# Patient Record
Sex: Female | Born: 1935 | Race: White | Hispanic: No | Smoking: Never smoker
Health system: Southern US, Community
[De-identification: ages and names within clinical notes are randomized; demographics above are authoritative.]

## PROBLEM LIST (undated history)

## (undated) DIAGNOSIS — K222 Esophageal obstruction: Secondary | ICD-10-CM

## (undated) DIAGNOSIS — F32A Depression, unspecified: Secondary | ICD-10-CM

## (undated) DIAGNOSIS — G249 Dystonia, unspecified: Secondary | ICD-10-CM

## (undated) DIAGNOSIS — K219 Gastro-esophageal reflux disease without esophagitis: Secondary | ICD-10-CM

## (undated) DIAGNOSIS — F419 Anxiety disorder, unspecified: Secondary | ICD-10-CM

## (undated) DIAGNOSIS — M810 Age-related osteoporosis without current pathological fracture: Secondary | ICD-10-CM

## (undated) DIAGNOSIS — E7800 Pure hypercholesterolemia, unspecified: Secondary | ICD-10-CM

## (undated) DIAGNOSIS — F329 Major depressive disorder, single episode, unspecified: Secondary | ICD-10-CM

## (undated) DIAGNOSIS — I10 Essential (primary) hypertension: Secondary | ICD-10-CM

## (undated) DIAGNOSIS — F0390 Unspecified dementia without behavioral disturbance: Secondary | ICD-10-CM

## (undated) DIAGNOSIS — I499 Cardiac arrhythmia, unspecified: Secondary | ICD-10-CM

## (undated) DIAGNOSIS — M1990 Unspecified osteoarthritis, unspecified site: Secondary | ICD-10-CM

## (undated) HISTORY — PX: TONSILLECTOMY: SUR1361

## (undated) HISTORY — DX: Pure hypercholesterolemia, unspecified: E78.00

## (undated) HISTORY — DX: Esophageal obstruction: K22.2

## (undated) HISTORY — DX: Unspecified dementia, unspecified severity, without behavioral disturbance, psychotic disturbance, mood disturbance, and anxiety: F03.90

## (undated) HISTORY — DX: Anxiety disorder, unspecified: F41.9

## (undated) HISTORY — DX: Age-related osteoporosis without current pathological fracture: M81.0

## (undated) HISTORY — DX: Dystonia, unspecified: G24.9

## (undated) HISTORY — PX: CATARACT EXTRACTION, BILATERAL: SHX1313

## (undated) HISTORY — PX: ESOPHAGOGASTRODUODENOSCOPY: SHX1529

---

## 1997-11-24 ENCOUNTER — Ambulatory Visit (HOSPITAL_COMMUNITY): Admission: RE | Admit: 1997-11-24 | Discharge: 1997-11-24 | Payer: Self-pay | Admitting: Internal Medicine

## 1997-12-31 ENCOUNTER — Ambulatory Visit (HOSPITAL_COMMUNITY): Admission: RE | Admit: 1997-12-31 | Discharge: 1997-12-31 | Payer: Self-pay | Admitting: Internal Medicine

## 1998-01-20 ENCOUNTER — Other Ambulatory Visit: Admission: RE | Admit: 1998-01-20 | Discharge: 1998-01-20 | Payer: Self-pay | Admitting: Obstetrics and Gynecology

## 1998-04-21 ENCOUNTER — Ambulatory Visit (HOSPITAL_COMMUNITY): Admission: RE | Admit: 1998-04-21 | Discharge: 1998-04-21 | Payer: Self-pay | Admitting: Internal Medicine

## 1998-04-21 ENCOUNTER — Encounter: Payer: Self-pay | Admitting: Internal Medicine

## 1999-01-05 ENCOUNTER — Encounter: Payer: Self-pay | Admitting: Emergency Medicine

## 1999-01-05 ENCOUNTER — Encounter: Payer: Self-pay | Admitting: *Deleted

## 1999-01-05 ENCOUNTER — Emergency Department (HOSPITAL_COMMUNITY): Admission: EM | Admit: 1999-01-05 | Discharge: 1999-01-05 | Payer: Self-pay | Admitting: Emergency Medicine

## 1999-01-10 ENCOUNTER — Encounter: Payer: Self-pay | Admitting: Specialist

## 1999-01-10 ENCOUNTER — Inpatient Hospital Stay (HOSPITAL_COMMUNITY): Admission: RE | Admit: 1999-01-10 | Discharge: 1999-01-12 | Payer: Self-pay | Admitting: Specialist

## 1999-01-10 ENCOUNTER — Encounter (INDEPENDENT_AMBULATORY_CARE_PROVIDER_SITE_OTHER): Payer: Self-pay | Admitting: Specialist

## 1999-01-12 ENCOUNTER — Inpatient Hospital Stay (HOSPITAL_COMMUNITY)
Admission: RE | Admit: 1999-01-12 | Discharge: 1999-01-20 | Payer: Self-pay | Admitting: Physical Medicine & Rehabilitation

## 1999-04-07 ENCOUNTER — Inpatient Hospital Stay (HOSPITAL_COMMUNITY): Admission: RE | Admit: 1999-04-07 | Discharge: 1999-04-10 | Payer: Self-pay | Admitting: Specialist

## 1999-05-25 ENCOUNTER — Other Ambulatory Visit: Admission: RE | Admit: 1999-05-25 | Discharge: 1999-05-25 | Payer: Self-pay | Admitting: Obstetrics and Gynecology

## 1999-09-08 ENCOUNTER — Encounter: Payer: Self-pay | Admitting: Internal Medicine

## 1999-09-08 ENCOUNTER — Ambulatory Visit (HOSPITAL_COMMUNITY): Admission: RE | Admit: 1999-09-08 | Discharge: 1999-09-08 | Payer: Self-pay | Admitting: Internal Medicine

## 1999-10-20 ENCOUNTER — Encounter: Payer: Self-pay | Admitting: Internal Medicine

## 1999-10-20 ENCOUNTER — Ambulatory Visit (HOSPITAL_COMMUNITY): Admission: RE | Admit: 1999-10-20 | Discharge: 1999-10-20 | Payer: Self-pay | Admitting: Internal Medicine

## 2000-05-28 ENCOUNTER — Other Ambulatory Visit: Admission: RE | Admit: 2000-05-28 | Discharge: 2000-05-28 | Payer: Self-pay | Admitting: Obstetrics and Gynecology

## 2001-06-03 ENCOUNTER — Other Ambulatory Visit: Admission: RE | Admit: 2001-06-03 | Discharge: 2001-06-03 | Payer: Self-pay | Admitting: Obstetrics and Gynecology

## 2001-11-26 ENCOUNTER — Encounter: Payer: Self-pay | Admitting: Internal Medicine

## 2001-11-26 ENCOUNTER — Ambulatory Visit (HOSPITAL_COMMUNITY): Admission: RE | Admit: 2001-11-26 | Discharge: 2001-11-26 | Payer: Self-pay | Admitting: Internal Medicine

## 2001-12-26 ENCOUNTER — Encounter: Payer: Self-pay | Admitting: Internal Medicine

## 2001-12-26 ENCOUNTER — Ambulatory Visit (HOSPITAL_COMMUNITY): Admission: RE | Admit: 2001-12-26 | Discharge: 2001-12-26 | Payer: Self-pay | Admitting: Internal Medicine

## 2002-06-03 ENCOUNTER — Other Ambulatory Visit: Admission: RE | Admit: 2002-06-03 | Discharge: 2002-06-03 | Payer: Self-pay | Admitting: Obstetrics and Gynecology

## 2002-09-03 ENCOUNTER — Encounter (INDEPENDENT_AMBULATORY_CARE_PROVIDER_SITE_OTHER): Payer: Self-pay | Admitting: *Deleted

## 2002-09-03 ENCOUNTER — Ambulatory Visit (HOSPITAL_COMMUNITY): Admission: RE | Admit: 2002-09-03 | Discharge: 2002-09-03 | Payer: Self-pay | Admitting: Internal Medicine

## 2002-11-04 ENCOUNTER — Ambulatory Visit (HOSPITAL_COMMUNITY): Admission: RE | Admit: 2002-11-04 | Discharge: 2002-11-04 | Payer: Self-pay | Admitting: Internal Medicine

## 2002-11-04 ENCOUNTER — Encounter: Payer: Self-pay | Admitting: Internal Medicine

## 2003-03-23 ENCOUNTER — Ambulatory Visit (HOSPITAL_COMMUNITY): Admission: RE | Admit: 2003-03-23 | Discharge: 2003-03-23 | Payer: Self-pay | Admitting: Internal Medicine

## 2003-03-23 ENCOUNTER — Encounter (INDEPENDENT_AMBULATORY_CARE_PROVIDER_SITE_OTHER): Payer: Self-pay | Admitting: Specialist

## 2003-04-20 ENCOUNTER — Ambulatory Visit (HOSPITAL_COMMUNITY): Admission: RE | Admit: 2003-04-20 | Discharge: 2003-04-20 | Payer: Self-pay | Admitting: Internal Medicine

## 2003-11-10 ENCOUNTER — Ambulatory Visit (HOSPITAL_COMMUNITY): Admission: RE | Admit: 2003-11-10 | Discharge: 2003-11-10 | Payer: Self-pay | Admitting: Internal Medicine

## 2004-05-11 ENCOUNTER — Ambulatory Visit (HOSPITAL_COMMUNITY): Admission: RE | Admit: 2004-05-11 | Discharge: 2004-05-11 | Payer: Self-pay | Admitting: Internal Medicine

## 2004-05-11 ENCOUNTER — Ambulatory Visit: Payer: Self-pay | Admitting: Internal Medicine

## 2004-06-08 ENCOUNTER — Other Ambulatory Visit: Admission: RE | Admit: 2004-06-08 | Discharge: 2004-06-08 | Payer: Self-pay | Admitting: Obstetrics and Gynecology

## 2004-11-24 ENCOUNTER — Ambulatory Visit: Payer: Self-pay | Admitting: Internal Medicine

## 2004-11-24 ENCOUNTER — Ambulatory Visit (HOSPITAL_COMMUNITY): Admission: RE | Admit: 2004-11-24 | Discharge: 2004-11-24 | Payer: Self-pay | Admitting: Internal Medicine

## 2005-01-17 ENCOUNTER — Ambulatory Visit: Payer: Self-pay | Admitting: Internal Medicine

## 2005-01-17 ENCOUNTER — Ambulatory Visit (HOSPITAL_COMMUNITY): Admission: RE | Admit: 2005-01-17 | Discharge: 2005-01-17 | Payer: Self-pay | Admitting: Internal Medicine

## 2005-02-20 ENCOUNTER — Ambulatory Visit (HOSPITAL_COMMUNITY): Admission: RE | Admit: 2005-02-20 | Discharge: 2005-02-20 | Payer: Self-pay | Admitting: Internal Medicine

## 2005-02-20 ENCOUNTER — Ambulatory Visit: Payer: Self-pay | Admitting: Internal Medicine

## 2005-03-06 ENCOUNTER — Ambulatory Visit (HOSPITAL_COMMUNITY): Admission: RE | Admit: 2005-03-06 | Discharge: 2005-03-06 | Payer: Self-pay | Admitting: Internal Medicine

## 2005-04-04 ENCOUNTER — Ambulatory Visit: Payer: Self-pay | Admitting: Internal Medicine

## 2005-04-04 ENCOUNTER — Ambulatory Visit (HOSPITAL_COMMUNITY): Admission: RE | Admit: 2005-04-04 | Discharge: 2005-04-04 | Payer: Self-pay | Admitting: Internal Medicine

## 2005-09-12 ENCOUNTER — Ambulatory Visit (HOSPITAL_COMMUNITY): Admission: RE | Admit: 2005-09-12 | Discharge: 2005-09-12 | Payer: Self-pay | Admitting: Internal Medicine

## 2005-09-18 ENCOUNTER — Ambulatory Visit: Payer: Self-pay | Admitting: Internal Medicine

## 2005-10-19 ENCOUNTER — Ambulatory Visit: Payer: Self-pay | Admitting: Gastroenterology

## 2005-10-19 ENCOUNTER — Ambulatory Visit (HOSPITAL_COMMUNITY): Admission: RE | Admit: 2005-10-19 | Discharge: 2005-10-19 | Payer: Self-pay | Admitting: Gastroenterology

## 2005-10-25 ENCOUNTER — Ambulatory Visit (HOSPITAL_COMMUNITY): Admission: RE | Admit: 2005-10-25 | Discharge: 2005-10-25 | Payer: Self-pay | Admitting: Internal Medicine

## 2005-10-30 ENCOUNTER — Ambulatory Visit: Payer: Self-pay | Admitting: Internal Medicine

## 2005-11-12 ENCOUNTER — Ambulatory Visit (HOSPITAL_COMMUNITY): Admission: RE | Admit: 2005-11-12 | Discharge: 2005-11-12 | Payer: Self-pay | Admitting: Internal Medicine

## 2006-04-29 ENCOUNTER — Ambulatory Visit (HOSPITAL_COMMUNITY): Admission: RE | Admit: 2006-04-29 | Discharge: 2006-04-29 | Payer: Self-pay | Admitting: Internal Medicine

## 2006-05-08 ENCOUNTER — Ambulatory Visit: Payer: Self-pay | Admitting: Internal Medicine

## 2006-06-07 ENCOUNTER — Ambulatory Visit (HOSPITAL_COMMUNITY): Admission: RE | Admit: 2006-06-07 | Discharge: 2006-06-07 | Payer: Self-pay | Admitting: Internal Medicine

## 2006-06-13 ENCOUNTER — Other Ambulatory Visit: Admission: RE | Admit: 2006-06-13 | Discharge: 2006-06-13 | Payer: Self-pay | Admitting: Obstetrics and Gynecology

## 2006-07-03 ENCOUNTER — Ambulatory Visit (HOSPITAL_COMMUNITY): Admission: RE | Admit: 2006-07-03 | Discharge: 2006-07-03 | Payer: Self-pay | Admitting: Internal Medicine

## 2006-07-08 ENCOUNTER — Ambulatory Visit: Payer: Self-pay | Admitting: Internal Medicine

## 2006-08-01 ENCOUNTER — Ambulatory Visit (HOSPITAL_COMMUNITY): Admission: RE | Admit: 2006-08-01 | Discharge: 2006-08-01 | Payer: Self-pay | Admitting: Internal Medicine

## 2006-09-17 ENCOUNTER — Ambulatory Visit: Payer: Self-pay | Admitting: Internal Medicine

## 2006-09-19 ENCOUNTER — Ambulatory Visit (HOSPITAL_COMMUNITY): Admission: RE | Admit: 2006-09-19 | Discharge: 2006-09-19 | Payer: Self-pay | Admitting: Internal Medicine

## 2006-11-19 ENCOUNTER — Ambulatory Visit (HOSPITAL_COMMUNITY): Admission: RE | Admit: 2006-11-19 | Discharge: 2006-11-19 | Payer: Self-pay | Admitting: Internal Medicine

## 2006-11-22 ENCOUNTER — Ambulatory Visit: Payer: Self-pay | Admitting: Internal Medicine

## 2006-12-27 ENCOUNTER — Emergency Department (HOSPITAL_COMMUNITY): Admission: EM | Admit: 2006-12-27 | Discharge: 2006-12-27 | Payer: Self-pay | Admitting: Emergency Medicine

## 2007-01-06 ENCOUNTER — Ambulatory Visit: Payer: Self-pay | Admitting: Gastroenterology

## 2007-01-27 ENCOUNTER — Ambulatory Visit (HOSPITAL_COMMUNITY): Admission: RE | Admit: 2007-01-27 | Discharge: 2007-01-27 | Payer: Self-pay | Admitting: Internal Medicine

## 2007-01-28 ENCOUNTER — Observation Stay (HOSPITAL_COMMUNITY): Admission: EM | Admit: 2007-01-28 | Discharge: 2007-01-30 | Payer: Self-pay | Admitting: Emergency Medicine

## 2007-03-26 ENCOUNTER — Ambulatory Visit (HOSPITAL_COMMUNITY): Admission: RE | Admit: 2007-03-26 | Discharge: 2007-03-26 | Payer: Self-pay | Admitting: Internal Medicine

## 2007-04-01 ENCOUNTER — Ambulatory Visit: Payer: Self-pay | Admitting: Internal Medicine

## 2007-06-02 ENCOUNTER — Ambulatory Visit (HOSPITAL_COMMUNITY): Admission: RE | Admit: 2007-06-02 | Discharge: 2007-06-02 | Payer: Self-pay | Admitting: Internal Medicine

## 2007-06-19 ENCOUNTER — Ambulatory Visit: Payer: Self-pay | Admitting: Internal Medicine

## 2007-07-18 ENCOUNTER — Ambulatory Visit (HOSPITAL_COMMUNITY): Admission: RE | Admit: 2007-07-18 | Discharge: 2007-07-18 | Payer: Self-pay | Admitting: Internal Medicine

## 2007-07-24 ENCOUNTER — Ambulatory Visit: Payer: Self-pay | Admitting: Internal Medicine

## 2007-09-24 ENCOUNTER — Telehealth: Payer: Self-pay | Admitting: Internal Medicine

## 2007-09-24 DIAGNOSIS — K219 Gastro-esophageal reflux disease without esophagitis: Secondary | ICD-10-CM

## 2007-09-24 DIAGNOSIS — K222 Esophageal obstruction: Secondary | ICD-10-CM

## 2007-10-23 ENCOUNTER — Ambulatory Visit (HOSPITAL_COMMUNITY): Admission: RE | Admit: 2007-10-23 | Discharge: 2007-10-23 | Payer: Self-pay | Admitting: Internal Medicine

## 2007-10-27 ENCOUNTER — Encounter: Payer: Self-pay | Admitting: Internal Medicine

## 2007-10-29 ENCOUNTER — Ambulatory Visit: Payer: Self-pay | Admitting: Internal Medicine

## 2007-11-26 ENCOUNTER — Telehealth (INDEPENDENT_AMBULATORY_CARE_PROVIDER_SITE_OTHER): Payer: Self-pay | Admitting: *Deleted

## 2008-01-28 ENCOUNTER — Encounter: Payer: Self-pay | Admitting: Internal Medicine

## 2008-02-24 ENCOUNTER — Ambulatory Visit: Payer: Self-pay | Admitting: Internal Medicine

## 2008-02-24 ENCOUNTER — Ambulatory Visit (HOSPITAL_COMMUNITY): Admission: RE | Admit: 2008-02-24 | Discharge: 2008-02-24 | Payer: Self-pay | Admitting: Internal Medicine

## 2008-05-18 ENCOUNTER — Telehealth (INDEPENDENT_AMBULATORY_CARE_PROVIDER_SITE_OTHER): Payer: Self-pay | Admitting: *Deleted

## 2008-05-18 ENCOUNTER — Encounter: Payer: Self-pay | Admitting: Internal Medicine

## 2008-06-09 ENCOUNTER — Ambulatory Visit: Payer: Self-pay | Admitting: Internal Medicine

## 2008-06-09 ENCOUNTER — Ambulatory Visit (HOSPITAL_COMMUNITY): Admission: RE | Admit: 2008-06-09 | Discharge: 2008-06-09 | Payer: Self-pay | Admitting: Internal Medicine

## 2008-09-09 ENCOUNTER — Telehealth: Payer: Self-pay | Admitting: Internal Medicine

## 2008-10-12 ENCOUNTER — Ambulatory Visit: Payer: Self-pay | Admitting: Internal Medicine

## 2008-10-12 ENCOUNTER — Ambulatory Visit (HOSPITAL_COMMUNITY): Admission: RE | Admit: 2008-10-12 | Discharge: 2008-10-12 | Payer: Self-pay | Admitting: Internal Medicine

## 2008-11-02 ENCOUNTER — Telehealth (INDEPENDENT_AMBULATORY_CARE_PROVIDER_SITE_OTHER): Payer: Self-pay | Admitting: *Deleted

## 2009-01-31 ENCOUNTER — Telehealth (INDEPENDENT_AMBULATORY_CARE_PROVIDER_SITE_OTHER): Payer: Self-pay | Admitting: *Deleted

## 2009-02-08 ENCOUNTER — Ambulatory Visit (HOSPITAL_COMMUNITY): Admission: RE | Admit: 2009-02-08 | Discharge: 2009-02-08 | Payer: Self-pay | Admitting: Internal Medicine

## 2009-02-08 ENCOUNTER — Ambulatory Visit: Payer: Self-pay | Admitting: Internal Medicine

## 2009-03-28 ENCOUNTER — Telehealth (INDEPENDENT_AMBULATORY_CARE_PROVIDER_SITE_OTHER): Payer: Self-pay | Admitting: *Deleted

## 2009-04-08 ENCOUNTER — Ambulatory Visit (HOSPITAL_COMMUNITY): Admission: RE | Admit: 2009-04-08 | Discharge: 2009-04-08 | Payer: Self-pay | Admitting: Internal Medicine

## 2009-04-08 ENCOUNTER — Ambulatory Visit: Payer: Self-pay | Admitting: Internal Medicine

## 2009-06-07 ENCOUNTER — Telehealth: Payer: Self-pay | Admitting: Internal Medicine

## 2009-06-14 ENCOUNTER — Ambulatory Visit: Payer: Self-pay | Admitting: Internal Medicine

## 2009-06-14 ENCOUNTER — Ambulatory Visit (HOSPITAL_COMMUNITY): Admission: RE | Admit: 2009-06-14 | Discharge: 2009-06-14 | Payer: Self-pay | Admitting: Internal Medicine

## 2009-09-27 ENCOUNTER — Telehealth: Payer: Self-pay | Admitting: Internal Medicine

## 2009-09-29 ENCOUNTER — Encounter (INDEPENDENT_AMBULATORY_CARE_PROVIDER_SITE_OTHER): Payer: Self-pay | Admitting: *Deleted

## 2009-10-12 ENCOUNTER — Ambulatory Visit: Payer: Self-pay | Admitting: Internal Medicine

## 2009-10-12 ENCOUNTER — Ambulatory Visit (HOSPITAL_COMMUNITY): Admission: RE | Admit: 2009-10-12 | Discharge: 2009-10-12 | Payer: Self-pay | Admitting: Internal Medicine

## 2010-01-30 ENCOUNTER — Telehealth: Payer: Self-pay | Admitting: Internal Medicine

## 2010-01-30 ENCOUNTER — Encounter (INDEPENDENT_AMBULATORY_CARE_PROVIDER_SITE_OTHER): Payer: Self-pay | Admitting: *Deleted

## 2010-02-03 ENCOUNTER — Telehealth (INDEPENDENT_AMBULATORY_CARE_PROVIDER_SITE_OTHER): Payer: Self-pay | Admitting: *Deleted

## 2010-02-17 ENCOUNTER — Ambulatory Visit: Payer: Self-pay | Admitting: Internal Medicine

## 2010-02-17 ENCOUNTER — Ambulatory Visit (HOSPITAL_COMMUNITY): Admission: RE | Admit: 2010-02-17 | Discharge: 2010-02-17 | Payer: Self-pay | Admitting: Internal Medicine

## 2010-04-20 ENCOUNTER — Telehealth: Payer: Self-pay | Admitting: Internal Medicine

## 2010-05-10 ENCOUNTER — Telehealth: Payer: Self-pay | Admitting: Internal Medicine

## 2010-05-23 NOTE — Procedures (Signed)
Summary: Upper Endoscopy  Patient: Chaelyn Bunyan Note: All result statuses are Final unless otherwise noted.  Tests: (1) Upper Endoscopy (EGD)   EGD Upper Endoscopy       DONE     [facility]     [address] [place], [place]  [address]           ENDOSCOPY PROCEDURE REPORT           PATIENT:  Shaquna, Geigle  MR#:  [id number]     BIRTHDATE:  1922/11/01, 86 yrs. old  GENDER:  female           ENDOSCOPIST:  [name]. Eda Keys, MD     Referred by:  .Direct           PROCEDURE DATE:  10/12/2009     PROCEDURE:  EGD with dilatation over guidewire (18mm)     ASA CLASS:  Class II     INDICATIONS:  dilation of esophageal stricture, dysphagia           MEDICATIONS:   Fentanyl 50 mcg IV, Versed 6 mg IV     TOPICAL ANESTHETIC:  Cetacaine Spray           DESCRIPTION OF PROCEDURE:   After the risks benefits and     alternatives of the procedure were thoroughly explained, informed     consent was obtained.  The Gastroscope [id number]     endoscope was introduced through the mouth and advanced to the     first portion of the duodenum, without limitations.  The     instrument was slowly withdrawn as the mucosa was fully examined.     <<PROCEDUREIMAGES>>           A 12mm stricture was found in the distal esophagus.  A hiatal     hernia was found.  Otherwise the examination was normal.     Retroflexed views revealed the hiatal hernia.           THERAPY: SAVARY GUIDEWIRE PLACED UNDER FLUOROSCOPIC ASSISTANCE     INTO THE GASTRIC ANTRUM. DILATOR PASSED AND MONITORED W/     FLUORO. NO RESISTANCE OR HEME. TOLERATED WELL.           COMPLICATIONS:  None           ENDOSCOPIC IMPRESSION:     1) Stricture in the distal esophagus - S/P DILATION     2) Hiatal hernia     3) Otherwise normal examination     4) Gerd     RECOMMENDATIONS:     1) Clear liquids until 11:30AM, then soft foods rest of day.     Resume prior diet tomorrow.     2) continue current medications     3)  Call the office to arrange repeat dilation in 3-4 months           _____________________________     [name]. Eda Keys, MD           CC:  Maurice Small, MD, The Patient           n.     eSIGNED:   [name]. Eda Keys at 10/12/2009 09:22 AM           Doylene Bode, [id number]  Note: An exclamation mark (!) indicates a result that was not dispersed into the flowsheet. Document Creation Date: 10/12/2009 9:23 AM _______________________________________________________________________  (1) Order result status: Final Collection or observation date-time:  10/12/2009 09:14 Requested date-time:  Receipt date-time:  Reported date-time:  Referring Physician:   Ordering Physician: Fransico Setters [phone]) Specimen Source:  Source: Launa Grill Order Number: [id number] Lab site:

## 2010-05-23 NOTE — Progress Notes (Signed)
Summary: TRIAGE: Lunch or Water will not go down  Phone Note Call from Patient   Caller: Terrill Mohr [phone] Call For: Dr Marina Goodell Reason for Call: Talk to Nurse Summary of Call: Was eating lunch and her food will not go down. Tried water and even that would not go down. What should she do? Initial call taken by: Leanor Kail [facility],  June 07, 2009 12:57 PM  Follow-up for Phone Call        If she can handle secretions and water in next 30 min, then stay on clears and we will arrange a dilation soon as outpt. otherwise to hospital for endoscopic management of food impaction. keep me posted Follow-up by: Hilarie Fredrickson MD,  June 07, 2009 1:40 PM  Additional Follow-up for Phone Call Additional follow up Details #1::         Daughter ntfd. of Dr.Kaedence Connelly's orders.She will call back in 30 minutes with update.Pt. can swallow her saliva ok.     Additional Follow-up by: Teryl Lucy RN,  June 07, 2009 1:44 PM    Additional Follow-up for Phone Call Additional follow up Details #2::    Daughter called to say pt. is able to get sips of water down now. Advised to continue with liquids and I will talk with Dr.Duchess Armendarez to schedule sav/dil. next week. Follow-up by: Teryl Lucy RN,  June 07, 2009 2:33 PM  Additional Follow-up for Phone Call Additional follow up Details #3:: Details for Additional Follow-up Action Taken: Yes continue with liquids and set up EGD/Savary dilation w/ C-arm for next week. It doesn't matter what day Additional Follow-up by: Hilarie Fredrickson MD,  June 07, 2009 2:42 PM

## 2010-05-23 NOTE — Progress Notes (Signed)
Summary: Nexium samples  Phone Note Other Incoming Call back at pt came for samples   Summary of Call: The pt's daughter came for samples of Nexium 40 Mg.  We gavae her 6 bottles, lot # [id number], Exp date: 08/2012. Initial call taken by: Joselyn Glassman,  February 03, 2010 4:05 PM

## 2010-05-23 NOTE — Progress Notes (Signed)
Summary: Sch'd Endo  Phone Note Call from Patient   Caller: Daughter [name] [phone] Call For: Dr. Marina Goodell Reason for Call: Talk to Nurse Summary of Call: Needs to sch'd Endo at [facility]also asking for Nexium samples Initial call taken by: Karna Christmas,  January 30, 2010 9:47 AM  Follow-up for Phone Call        pt daughter [name] aware and instructions mailed to her home. Meds were reviewed.  Nexium samples left at the front desk. Follow-up by: Chales Abrahams CMA Duncan Dull),  January 30, 2010 10:17 AM

## 2010-05-23 NOTE — Procedures (Signed)
Summary: Upper Endoscopy  Patient: [name] Note: All result statuses are Final unless otherwise noted.  Tests: (1) Upper Endoscopy (EGD)   EGD Upper Endoscopy       DONE     [facility]     [address] [place], [place]  [address]           ENDOSCOPY PROCEDURE REPORT           PATIENT:  [name], [name]  MR#:  [id number]     BIRTHDATE:  01/14/1923, 87 yrs. old  GENDER:  female           ENDOSCOPIST:  [name]. Eda Keys, MD     Referred by:  .Direct           PROCEDURE DATE:  02/17/2010     PROCEDURE:  EGD with dilatation over guidewire - 18mm     FLUOROSCOPY TO ASSIST ESOPHAGEAL     DILATION     ASA CLASS:  Class II     INDICATIONS:  dilation of esophageal stricture           MEDICATIONS:   Fentanyl 50 mcg IV, Versed 5 mg     TOPICAL ANESTHETIC:  Cetacaine Spray           DESCRIPTION OF PROCEDURE:   After the risks benefits and     alternatives of the procedure were thoroughly explained, informed     consent was obtained.  The Pentax [id number] 3.2 [id number] endoscope     was introduced through the mouth and advanced to the second     portion of the duodenum, without limitations.  The instrument was     slowly withdrawn as the mucosa was fully examined.     <<PROCEDUREIMAGES>>           A 12 mm ring-like stricture was found in the distal esophagus.     The stomach was entered and closely examined. The antrum,     angularis, and lesser curvature were well visualized, including a     retroflexed view of the cardia and fundus. The stomach wall was     normally distensable. The scope passed easily through the pylorus     into the duodenum.  The duodenal bulb was normal in appearance, as     was the postbulbar duodenum.    Retroflexed views revealed a small     hiatal hernia.           THERAPY: SAVARY GUIDEWIRE PLACED IN ANTRUM W/ FLUOROSCOPIC     GUIDANCE. DILATOR PASSED OVER WIRE. NO RESISTANCE OR HEME.     TOLERATED WELL           COMPLICATIONS:   None           ENDOSCOPIC IMPRESSION:     1) Stricture in the distal esophagus - S/P DILATION TO     2) Normal stomach     3) Normal duodenum     4) A small hiatal hernia     RECOMMENDATIONS:     1) Clear liquids until 1pm, then soft foods rest of day. Resume     prior diet tomorrow.     2) Continue medication for reflux     3) EGD/DILATION at HOSPITAL in 3-4 months           ______________________________     [name]. Eda Keys, MD           CC:  Maurice Small, MD, The Patient           n.     eSIGNED:   [name]. Eda Keys at 02/17/2010 11:03 AM           Doylene Bode, [id number]  Note: An exclamation mark (!) indicates a result that was not dispersed into the flowsheet. Document Creation Date: 02/17/2010 11:04 AM _______________________________________________________________________  (1) Order result status: Final Collection or observation date-time: 02/17/2010 10:56 Requested date-time:  Receipt date-time:  Reported date-time:  Referring Physician:   Ordering Physician: Fransico Setters [phone]) Specimen Source:  Source: Launa Grill Order Number: [id number] Lab site:

## 2010-05-23 NOTE — Letter (Signed)
Summary: EGD Instructions  [facility]  [address] [place], [place] [address]   Phone: [phone]  Fax: [phone]       [name]    07-Jul-1922    MRN: [id number]       Procedure Day /Date:02/17/10  FRI     Arrival Time:9:15 am     Procedure Time:10:15 am     Location of Procedure:                     [facility] ( Outpatient Registration)    PREPARATION FOR ENDOSCOPY   On 02/17/10 THE DAY OF THE PROCEDURE:  1.   No solid foods, milk or milk products are allowed after midnight the night before your procedure.  2.   Do not drink anything colored red or purple.  Avoid juices with pulp.  No orange juice.  3.  You may drink clear liquids until 615 am , which is 4 hours before your procedure.                                                                                                CLEAR LIQUIDS INCLUDE: Water Jello Ice Popsicles Tea (sugar ok, no milk/cream) Powdered fruit flavored drinks Coffee (sugar ok, no milk/cream) Gatorade Juice: apple, white grape, white cranberry  Lemonade Clear bullion, consomm, broth Carbonated beverages (any kind) Strained chicken noodle soup Hard Candy   MEDICATION INSTRUCTIONS  Unless otherwise instructed, you should take regular prescription medications with a small sip of water as early as possible the morning of your procedure.               OTHER INSTRUCTIONS  You will need a responsible adult at least 75 years of age to accompany you and drive you home.   This person must remain in the waiting room during your procedure.  Wear loose fitting clothing that is easily removed.  Leave jewelry and other valuables at home.  However, you may wish to bring a book to read or an iPod/MP3 player to listen to music as you wait for your procedure to start.  Remove all body piercing jewelry and leave at home.  Total time from sign-in until discharge is approximately 2-3 hours.  You should  go home directly after your procedure and rest.  You can resume normal activities the day after your procedure.  The day of your procedure you should not:   Drive   Make legal decisions   Operate machinery   Drink alcohol   Return to work  You will receive specific instructions about eating, activities and medications before you leave.    The above instructions have been reviewed and explained to me by   Chales Abrahams CMA Duncan Dull)  January 30, 2010 10:19 AM     I fully understand and can verbalize these instructions over the phoe and mailed to the home Date 01/30/10

## 2010-05-23 NOTE — Progress Notes (Signed)
Summary: Nexium/EGD  Phone Note Call from Patient Call back at [phone]   Caller: Daughter Call For: Dr. Marina Goodell Reason for Call: Talk to Nurse Summary of Call: would like more samples of Nexium for pt... would also like to sch an EGD at [facility] Initial call taken by: Vallarie Mare,  September 27, 2009 4:13 PM  Follow-up for Phone Call        left message on machine to call back Chales Abrahams CMA Duncan Dull)  September 27, 2009 4:20 PM   Nexium samples left at front desk and pt to be scheduled for EGD [facility] Savary Dil.  left message on machine to call back Chales Abrahams CMA Duncan Dull)  September 28, 2009 8:58 AM   left message on machine to call back Chales Abrahams CMA Duncan Dull)  September 29, 2009 9:07 AM   I recieved a flag that the daughter of the pt was returning my call I called her back and had to leave a message. Chales Abrahams CMA Duncan Dull)  September 29, 2009 2:27 PM   Additional Follow-up for Phone Call Additional follow up Details #1::        pt daughter returned call and she was made aware of the samples of nexium at the front desk.  Appt for [facility] EGD [facility] dil [facility] was also scheduled.  Meds were reviewed and instructions given over the phone and mailed. Additional Follow-up by: Chales Abrahams CMA Duncan Dull),  September 29, 2009 2:44 PM

## 2010-05-23 NOTE — Letter (Signed)
Summary: EGD Instructions  [facility]  [address] [place], [place] [address]   Phone: [phone]  Fax: [phone]       [name]    10-Dec-1922    MRN: [id number]       Procedure Day /Date:10/12/09  WED       Arrival Time: 730 am     Procedure Time:830 am    Location of Procedure:                     [facility] ( Outpatient Registration)    PREPARATION FOR ENDOSCOPY   On 10/12/09 THE DAY OF THE PROCEDURE:  1.   No solid foods, milk or milk products are allowed after midnight the night before your procedure.  2.   Do not drink anything colored red or purple.  Avoid juices with pulp.  No orange juice.  3.  You may drink clear liquids until 430 am , which is 4 hours before your procedure.                                                                                                CLEAR LIQUIDS INCLUDE: Water Jello Ice Popsicles Tea (sugar ok, no milk/cream) Powdered fruit flavored drinks Coffee (sugar ok, no milk/cream) Gatorade Juice: apple, white grape, white cranberry  Lemonade Clear bullion, consomm, broth Carbonated beverages (any kind) Strained chicken noodle soup Hard Candy   MEDICATION INSTRUCTIONS  Unless otherwise instructed, you should take regular prescription medications with a small sip of water as early as possible the morning of your procedure.               OTHER INSTRUCTIONS  You will need a responsible adult at least 75 years of age to accompany you and drive you home.   This person must remain in the waiting room during your procedure.  Wear loose fitting clothing that is easily removed.  Leave jewelry and other valuables at home.  However, you may wish to bring a book to read or an iPod/MP3 player to listen to music as you wait for your procedure to start.  Remove all body piercing jewelry and leave at home.  Total time from sign-in until discharge is approximately 2-3 hours.  You should go  home directly after your procedure and rest.  You can resume normal activities the day after your procedure.  The day of your procedure you should not:   Drive   Make legal decisions   Operate machinery   Drink alcohol   Return to work  You will receive specific instructions about eating, activities and medications before you leave.    The above instructions have been reviewed and explained to me by   Chales Abrahams CMA Duncan Dull)  September 29, 2009 2:46 PM     I fully understand and can verbalize these instructions over the phone and mailed to home Date 09/29/09

## 2010-05-23 NOTE — Procedures (Signed)
Summary: Upper Endoscopy  Patient: [name] Note: All result statuses are Final unless otherwise noted.  Tests: (1) Upper Endoscopy (EGD)   EGD Upper Endoscopy       DONE     [facility]     [address] [place], [place]  [address]           ENDOSCOPY PROCEDURE REPORT           PATIENT:  Connie, [name]  MR#:  [id number]     BIRTHDATE:  Nov 05, 1922, 86 yrs. old  GENDER:  female           ENDOSCOPIST:  [name]. Eda Keys, MD     Referred by:  .Direct / Self           PROCEDURE DATE:  06/14/2009     PROCEDURE:  EGD with dilatation over guidewire with Fluoroscopic     guidance     ASA CLASS:  Class II     INDICATIONS:  dysphagia, dilation of esophageal stricture           MEDICATIONS:   Fentanyl 50 mcg IV, Versed 5 mg     TOPICAL ANESTHETIC:  Cetacaine Spray           DESCRIPTION OF PROCEDURE:   After the risks benefits and     alternatives of the procedure were thoroughly explained, informed     consent was obtained.  The Pentax Gastroscope [id number] endoscope     was introduced through the mouth and advanced to the second     portion of the duodenum, without limitations.  The instrument was     slowly withdrawn as the mucosa was fully examined.     <<PROCEDUREIMAGES>>           An 11mm stricture was found in the distal esophagus.  The upper,     and middle third of the esophagus were carefully inspected and no     abnormalities were noted. The z-line was well seen at the GEJ. The     endoscope was pushed into the fundus which was normal including a     retroflexed view. The antrum,gastric body, first and second part     of the duodenum were unremarkable.    Retroflexed views revealed a     hiatal hernia.    The scope was then withdrawn from the patient     and the procedure completed.           THERAPY: GUIDEWIRE PLACED IN ANTRUM VIA FLUOROSCOPY. SAVARY     DILATION OVERWIRE W/O RESISTANCE OR HEME. TOLERATED WELL           COMPLICATIONS:  None       ENDOSCOPIC IMPRESSION:     1) Stricture in the distal esophagus - S/P DILATION     2) Normal EGD OTHERWISE     3) A small hiatal hernia     RECOMMENDATIONS:     1) Clear liquids until 2 pm, then soft foods rest of day. Resume     prior diet tomorrow.     2) Chew well     3) Continue medications     4) Repeat dilation in 3-4 months. Call Eln[facility] Maxwell to arrange           ______________________________     [name]. Eda Keys, MD           CC:  Maurice Small, MD; The Patient  n.     eSIGNED:   [name]. Eda Keys at 06/14/2009 11:59 AM           Doylene Bode, [id number]  Note: An exclamation mark (!) indicates a result that was not dispersed into the flowsheet. Document Creation Date: 06/14/2009 11:59 AM _______________________________________________________________________  (1) Order result status: Final Collection or observation date-time: 06/14/2009 11:50 Requested date-time:  Receipt date-time:  Reported date-time:  Referring Physician:   Ordering Physician: Fransico Setters [phone]) Specimen Source:  Source: Launa Grill Order Number: [id number] Lab site:

## 2010-05-25 NOTE — Progress Notes (Signed)
Summary: Samples  Phone Note Call from Patient   Caller: Daughter [name] [phone] Call For: Dr. Marina Goodell Reason for Call: Talk to Nurse Summary of Call: Requesting samples of Nexium Initial call taken by: Karna Christmas,  April 20, 2010 2:21 PM  Follow-up for Phone Call        No samples available.Will call back next week. Follow-up by: Teryl Lucy RN,  April 20, 2010 2:32 PM

## 2010-05-25 NOTE — Progress Notes (Signed)
  Phone Note Outgoing Call   Call placed by: Milford Cage NCMA,  May 10, 2010 4:03 PM Call placed to: Patient Summary of Call: Called to tell daughter that her mom's samples of Nexium are at the front desk.      New/Updated Medications: NEXIUM 40 MG  CPDR (ESOMEPRAZOLE MAGNESIUM) 1 capsule each day 30 minutes before meal

## 2010-06-15 ENCOUNTER — Encounter: Payer: Self-pay | Admitting: Internal Medicine

## 2010-06-15 ENCOUNTER — Telehealth (INDEPENDENT_AMBULATORY_CARE_PROVIDER_SITE_OTHER): Payer: Self-pay

## 2010-06-20 NOTE — Letter (Signed)
Summary: EGD Instructions  [facility]  [address]   [place], [place] [address]   Phone: [phone]  Fax: [phone]       [name]    Nov 02, 1922    MRN: [id number]       Procedure Day /Date:07/18/10     Arrival Time: 7:30am     Procedure Time:8:30am     Location of Procedure:                     _ X _ [facility] ( Outpatient Registration)  PREPARATION FOR ENDOSCOPY   On 07/18/10 THE DAY OF THE PROCEDURE:  1.   No solid foods, milk or milk products are allowed after midnight the night before your procedure.  2.   Do not drink anything colored red or purple.  Avoid juices with pulp.  No orange juice.  3.  You may drink clear liquids until 4:30am, which is 4 hours before your procedure.                                                                                                CLEAR LIQUIDS INCLUDE: Water Jello Ice Popsicles Tea (sugar ok, no milk/cream) Powdered fruit flavored drinks Coffee (sugar ok, no milk/cream) Gatorade Juice: apple, white grape, white cranberry  Lemonade Clear bullion, consomm, broth Carbonated beverages (any kind) Strained chicken noodle soup Hard Candy   MEDICATION INSTRUCTIONS  Unless otherwise instructed, you should take regular prescription medications with a small sip of water as early as possible the morning of your procedure.            OTHER INSTRUCTIONS  You will need a responsible adult at least 75 years of age to accompany you and drive you home.   This person must remain in the waiting room during your procedure.  Wear loose fitting clothing that is easily removed.  Leave jewelry and other valuables at home.  However, you may wish to bring a book to read or an iPod/MP3 player to listen to music as you wait for your procedure to start.  Remove all body piercing jewelry and leave at home.  Total time from sign-in until discharge is approximately 2-3 hours.  You should go home  directly after your procedure and rest.  You can resume normal activities the day after your procedure.  The day of your procedure you should not:   Drive   Make legal decisions   Operate machinery   Drink alcohol   Return to work  You will receive specific instructions about eating, activities and medications before you leave.    The above instructions have been reviewed and explained to me by   _______________________    I fully understand and can verbalize these instructions _____________________________ Date _________     Appended Document: EGD Instructions Letter mailed to daughter's home address.

## 2010-06-20 NOTE — Progress Notes (Signed)
Summary: EGD with Dil  ---- Converted from flag ---- ---- 06/15/2010 10:03 AM, Hilarie Fredrickson MD wrote: any morning  except moday. 8:30am. march hosp week  ---- 06/15/2010 9:52 AM, Selinda Michaels RN wrote: Per Mrs. [name] her mother was supposed to have an esophageal dil in Feb but Mrs. [name] was not well and could not get this taken care of. She would like her mother scheduled for one of your hospital days in the morning for the dil. Any particular day you would like her scheduled? It looks like your hospital week is the week of March 26th. ------------------------------       Additional Follow-up for Phone Call Additional follow up Details #2::    Appt scheduled for EGD with dil and fluro with Dr. Marina Goodell for 3/27@8 :30am. Scheduled with Annice Pih #[id number] Follow-up by: Selinda Michaels RN,  June 15, 2010 1:12 PM

## 2010-06-26 ENCOUNTER — Telehealth: Payer: Self-pay | Admitting: Internal Medicine

## 2010-07-04 NOTE — Progress Notes (Signed)
Summary: schedule EGD with Dil.  ---- Converted from flag ---- ---- 02/22/2010 12:12 PM, Milford Cage NCMA wrote: Schedule EGD with dil. at [facility] ------------------------------  Phone Note Outgoing Call   Call placed by: Milford Cage NCMA,  June 26, 2010 9:27 AM Call placed to: Patient Summary of Call: EGD with dil has already been scheduled for March 27th.  Initial call taken by: Milford Cage NCMA,  June 26, 2010 2:11 PM

## 2010-07-18 ENCOUNTER — Encounter: Payer: Medicare Other | Admitting: Internal Medicine

## 2010-07-18 ENCOUNTER — Ambulatory Visit (HOSPITAL_COMMUNITY)
Admission: RE | Admit: 2010-07-18 | Discharge: 2010-07-18 | Disposition: A | Discharge status: Home / Self Care | Payer: Medicare Other | Source: Ambulatory Visit | Attending: Internal Medicine | Admitting: Internal Medicine

## 2010-07-18 ENCOUNTER — Ambulatory Visit (HOSPITAL_COMMUNITY): Payer: Medicare Other

## 2010-07-18 ENCOUNTER — Encounter: Payer: Self-pay | Admitting: Internal Medicine

## 2010-07-18 DIAGNOSIS — K222 Esophageal obstruction: Secondary | ICD-10-CM

## 2010-07-18 DIAGNOSIS — K449 Diaphragmatic hernia without obstruction or gangrene: Secondary | ICD-10-CM | POA: Insufficient documentation

## 2010-07-25 NOTE — Procedures (Signed)
Summary: Upper Endoscopy  Patient: [name] Note: All result statuses are Final unless otherwise noted.  Tests: (1) Upper Endoscopy (EGD)   EGD Upper Endoscopy       DONE     [facility]     [address] [place], [place]  [address]          ENDOSCOPY PROCEDURE REPORT          PATIENT:  Connie, [name]  MR#:  [id number]     BIRTHDATE:  [birth year], 87 yrs. old  GENDER:  female          ENDOSCOPIST:  [name]. Eda Keys, MD     Referred by:  .Direct          PROCEDURE DATE:  07/18/2010     PROCEDURE:  EGD with dilatation over guidewire - 18mm     Fluoroscopy to assist esophageal     dilation     ASA CLASS:  Class II     INDICATIONS:  dysphagia, dilation of esophageal stricture          MEDICATIONS:   Fentanyl 50 mcg, Versed 6 mg IV     TOPICAL ANESTHETIC:  Cetacaine Spray          DESCRIPTION OF PROCEDURE:   After the risks benefits and     alternatives of the procedure were thoroughly explained, informed     consent was obtained.  The EG-[id number] ([id number]) endoscope was     introduced through the mouth and advanced to the second portion of     the duodenum, without limitations.  The instrument was slowly     withdrawn as the mucosa was fully examined.     <<PROCEDUREIMAGES>>          A focal ring-like 11-26mm stricture was found in the distal     esophagus.  A hiatal hernia was found.  Otherwise normal stomach.     The duodenal bulb was normal in appearance, as was the postbulbar     duodenum.    Retroflexed views revealed the hiatal hernia.     THERAPY: SAVARY guidewire placed in antrum w/ fluoroscopy. 18mm     dilator passed over guidewire w/o resistance or heme. Tolerated     well          COMPLICATIONS:  None          ENDOSCOPIC IMPRESSION:     1) Stricture in the distal esophagus - s/p dilation to 18mm     2) Hiatal hernia     3) Otherwise normal stomach     4) Normal duodenum          RECOMMENDATIONS:     1) Clear liquids until 12  noon, then soft foods rest of day.     Resume prior diet tomorrow.     2) Continue Reflux medication     3) Repeat endoscopy with dilation (Savary w/ C-arm) at hospital     in 3-4 months          ______________________________     [name]. Eda Keys, MD          CC:  Maurice Small, MD; The Patient          n.     eSIGNED:   [name]. Eda Keys at 07/18/2010 09:44 AM          Doylene Bode, [id number]  Note: An exclamation mark (!) indicates a result that was  not dispersed into the flowsheet. Document Creation Date: 07/18/2010 9:45 AM _______________________________________________________________________  (1) Order result status: Final Collection or observation date-time: 07/18/2010 09:35 Requested date-time:  Receipt date-time:  Reported date-time:  Referring Physician:   Ordering Physician: Fransico Setters [phone]) Specimen Source:  Source: Launa Grill Order Number: [id number] Lab site:

## 2010-09-05 NOTE — Consult Note (Signed)
[name], [name] NO.:  [id number]   MEDICAL RECORD NO.:  [id number]          PATIENT TYPE:  OBV   LOCATION:  3731                         FACILITY:  [facility]   PHYSICIAN:  Lyn Records, M.D.   DATE OF BIRTH:  06-02-1922   DATE OF CONSULTATION:  01/29/2007  DATE OF DISCHARGE:  01/30/2007                                 CONSULTATION   REASON FOR CONSULTATION:  Elevated blood pressure, fatigue, and  dizziness.   CONCLUSIONS:  1. Blood pressure, it is somewhat labile but under adequate control      currently.  2. Hypokalemia, repleted.  3. Recent change in personality, forgetfulness, of uncertain etiology.      Rule out early dementia.      a.     Mild atrophy and calcification on CT scan.  4. History of esophageal reflux with recurrent dysphagia requiring      esophageal dilatation.   RECOMMENDATIONS:  No specific cardiac evaluation is indicated at this  time.  Continue low dose HCTZ.  If blood pressure does not remain  controlled, we will add a low dose ACE or ARB perhaps in the form of a  combination tablet such as Diovan HCT.   COMMENTS:  The patient is 77 years of age and we are asked to see the  patient primarily because the daughter has requested a cardiology  evaluation for poorly controlled blood pressure.  After beginning to  speak with the patient and daughter, it becomes apparent that the  daughter is very concerned about recent change in the personality of her  mother and also increasing forgetfulness over the past 3 to 6 months.  She is also concerned about decreased food intake and weight loss.  The  patient has no specific complaints.  Speaking with her and watching the  interaction between the patient and the daughter, it becomes obvious  that the daughter is very concerned, has to prompt her mother on almost  every complaint and simply cannot remember a lot of things that the  daughter is concerned about.  She voices no specific complaints  today.   MEDICATIONS:  1. HCTZ 12.5 mg per day.  2. Protonix 40 mg b.i.d.  3. Evista daily.  4. Xanax 0.25 mg 1/2 tablet b.i.d. p.r.n.   ALLERGIES:  CODEINE, TOPROL, and NORVASC possibly causing nausea.   FAMILY HISTORY:  Noncontributory.   SOCIAL HISTORY:  She is widowed.  She lives independently.  She has been  living in [place] since the early 1990s.  She lives very close to her  daughter.   PHYSICAL EXAMINATION:  GENERAL:  On exam, the patient is in no distress.  She is sitting, eating supper.  Her blood pressure is 150/80 and heart  rate is 72.  HEENT:  Unremarkable.  NECK:  No JVD, carotid bruits, or thyromegaly.  A 1/6 systolic murmur.  LUNGS:  Clear.  ABDOMEN:  Soft.  EXTREMITIES:  No edema.   EKG reveals sinus rhythm with frequent PACs.  Monitor reveals the same  thing.  Her potassium on admission was 3.0, BUN and creatinine were  normal.  Sodium is 130.   DISCUSSION:  There are no acute problems currently.  Her systolic blood  pressure is little elevated.  We may need to add low-dose ARB therapy.  I would not go to a high dose diuretic for fear of causing great  electrolyte disturbance.  She will be probably discharged on some  supplemental potassium, especially if angiotensin system blockade is not  undertaken with therapy.   I had a long discussion with her daughter and recommended that an  outpatient neurologic evaluation could be a consideration with reference  to the patient's change in personality, and also psychiatry evaluation  would be a possible consideration as well as perhaps some of her change  is related to depression although I could not identify definite clinical  indicators of depression by my exam.      Lyn Records, M.D.  Electronically Signed     HWS/MEDQ  D:  01/30/2007  T:  01/30/2007  Job:  [id number]   cc:   Gretta Arab. Valentina Lucks, M.D.  Kela Millin, M.D.

## 2010-09-05 NOTE — H&P (Signed)
[name], [name]              ACCOUNT NO.:  [id number]   MEDICAL RECORD NO.:  [id number]          PATIENT TYPE:  INP   LOCATION:  1846                         FACILITY:  [facility]   PHYSICIAN:  [name], M.D.DATE OF BIRTH:  1922-06-11   DATE OF ADMISSION:  01/28/2007  DATE OF DISCHARGE:                              HISTORY & PHYSICAL   ATTENDING PHYSICIAN:  [name], M.D.   PRIMARY CARE PHYSICIAN:  Connie Arab. Valentina [name], M.D.   CHIEF COMPLAINT:  Weakness and loss of appetite.   HISTORY OF PRESENT ILLNESS:  The patient is an 77 year old white female  with past medical history of hypertension, esophageal narrowing, and  anxiety, who has a routine esophageal dilatation done approximately  every 3 months.  She had one done yesterday and initially was fine,  although today she started to have complaints of some generalized  weakness, headache, shakiness, and uneasiness.  Her daughter has also  noted that over the past few months in the course of the summer, she was  found to have high blood pressure and started on blood pressure  medications; specifically, initially Toprol, then changed over th  Norvasc, then changed over to hydrochlorothiazide.  The reason for these  medication changes is that whenever she started on these medicines, she  was feeling weak, shaky, and also daughter had noted that over the  course of the month she had a decreased appetite, poor p.o. intake,  decreased energy.  When she raised these concerns with [name], the GI  doctor who performs the esophageal dilatations, he said that this  possibly could be depression or perhaps another cause and recommended a  neurology workup.  The plan was for the patient to follow up with  neurology as an outpatient; however, with her having these new symptoms  and shakiness, unsteadiness, and being brought into the emergency room,  the family would also want to continue the workup as an inpatient as  well as  getting neurology involved.   Currently the patient is doing a little bit better.  She complains of  some generalized fatigue.  She denies any headaches, vision changes,  dysphagia.  No chest pain or palpitations.  No shortness of breath,  wheeze, cough.  No abdominal pain, no hematuria, dysuria, constipation,  diarrhea.  No focal extremity numbness, weakness, or pain.   REVIEW OF SYSTEMS:  Otherwise negative.   PAST MEDICAL HISTORY:  1. Hypertension.  2. Anxiety.  3. Esophageal narrowing.   MEDICATIONS:  1. Nexium 40 mg b.i.d.  2. Evista daily.  3. Hydrochlorothiazide 12.5 mg nightly.  4. Xanax 1/2 of a 0.25 mg tablet b.i.d.   ALLERGIES:  She has an allergy to CODEINE.   SOCIAL HISTORY:  No tobacco, alcohol, or drug use.   FAMILY HISTORY:  Noncontributory.   PHYSICAL EXAMINATION:  VITAL SIGNS:  On admission, temperature 98.2,  heart rate 87, blood pressure 195/72, now down to 155/78, respirations  16, O2 saturation 100% on room air.  GENERAL:  She is alert and oriented, although she does have problems  with short-term memory.  She does  currently know the date, time, and  location.  HEENT:  Normocephalic and atraumatic.  Mucous membranes are slightly  dry.  NECK:  No carotid bruits.  HEART:  Irregular rhythm but rate controlled.  LUNGS:  Clear to auscultation bilaterally.  She is moving air well.  ABDOMEN:  Soft, nontender, nondistended.  Positive bowel sounds.  EXTREMITIES:  Show no clubbing, cyanosis, or edema.   LABORATORY DATA:  EKG shows a marked sinus arrhythmia with occasional  areas of loss of P waves.  She does have some noting of inferior ST  questionable depression.   White count 5.8, hemoglobin 15.6, hematocrit 40, MCV 97, platelets 242,  no shift.  UA showed trace leukocyte esterase and 40 ketones, otherwise  negative.  Sodium 130, potassium 3, chloride 97, bicarb 25, BUN 6,  creatinine 0.9, glucose 105.   ASSESSMENT AND PLAN:  1. Weakness.  See above.   This may be multifactorial.  The possibles      may include hypothyroidism, depression, possibly medication      related, although less likely.  She does not appear to be      orthostatic and hypotensive.  Will plan to bring the patient into      the hospital, check liver function tests, check albumin status as      well as a thyroid study.  Will also discuss with [name], of      neurology, but also this could be depression.  Will speak with Dr.      Jeanie [name] from psychiatry.  2. Malignant hypertension.  The patient does not appear to be well      controlled on her hypertension, and with anxiety, this may be      worsening.  Certainly she has stroke risk factors.  Will continue      hydrochlorothiazide and put her on p.r.n. clonidine.  3. Possible electrocardiographic changes.  Will check cardiac markers.  4. Hypokalemia.  Will replace her potassium.  5. Hyponatremia, mild.  She is on hydrochlorothiazide. This may be a      cause.      [name], M.D.  Electronically Signed     SKK/MEDQ  D:  01/28/2007  T:  01/28/2007  Job:  [id number]   cc:   [name], M.D.  [name], M.D.  Connie Lacks, MD  [name], M.D.

## 2010-09-05 NOTE — Consult Note (Signed)
NAMEWILMA, [name]              ACCOUNT NO.:  [id number]   MEDICAL RECORD NO.:  [id number]          PATIENT TYPE:  OBV   LOCATION:  3731                         FACILITY:  [facility]   PHYSICIAN:  Antonietta Breach, M.D.  DATE OF BIRTH:  December 18, 1922   DATE OF CONSULTATION:  01/30/2007  DATE OF DISCHARGE:  01/30/2007                                 CONSULTATION   REASON FOR CONSULTATION:  Depression and anxiety.   REQUESTING PHYSICIAN:  Adeline Viyuoh.   HISTORY OF PRESENT ILLNESS:  Mrs. [name] is an 77 year old  female admitted to the [facility] on January 28, 2007 due to weakness  and loss of appetite.   The patient requests that her daughter be present during the session to  help with providing history as well as facilitating education and  support.   The patient and her daughter describe approximately 12 weeks of  progressive development of social withdrawal, decreased energy, reduced  interests, difficulty concentrating, and drop in appetite.  The patient  has some depressed mood.  The patient has had a 15 pound weight loss  over approximately 3 months.   Mrs. [name] has not had any suicidal thoughts.  She has not had  thoughts of harming others.  She has no hallucinations or delusions.   Mrs. [name] continues with a long-term history of feeling on edge,  excessive worry and muscle tension.  She takes Xanax 0.125 mg b.i.d.  with good results in treating the anxiety.  She has no adverse effects  from that   The patient also has had some slight difficulty with memory in the past  2 months.   PAST PSYCHIATRIC HISTORY:  Mrs. [name] and her daughter both deny any  history of prior depression.  Mrs. [name] does have a long-term  history of excessive worry,feeling on edge, muscle tension.  She has no  history of suicide attempts or psychiatric care.   Her primary care physician has prescribed the Xanax.  She has no history  of mania.   On review of the past  medical record the patients has anxiety disorder  listed in December 2000 and she was on Xanax at that time.   FAMILY PSYCHIATRIC HISTORY:  None known.   SOCIAL HISTORY:  Mrs. [name] is widowed.  She lives by herself.  She  has two children.  Her daughter is a Manufacturing systems engineer living in the  local area.   Mrs. [name] does not use any alcohol or illegal drugs.  Her religion  is [employer] and she draws much support from her religion.  She is retired  from working in a Engineering geologist as a Merchandiser, retail.   PAST MEDICAL HISTORY:  Hypertension, esophageal stricture.   ALLERGIES:  The patient has an allergy to CODEINE.   MEDICATIONS:  The MAR is reviewed.  The patient is on Xanax 0.125 mg  b.i.d.   LABORATORY DATA:  WBC 5.4, hemoglobin 13.8, platelet count 327.  Basic  metabolic panel is unremarkable except for a slightly elevated glucose  at 111.  TSH is within normal limits.  SGOT 17, SGPT 12.  Head  CT  without contrast showed no bleed.  She had mild atrophy with some  vascular calcification.   REVIEW OF SYSTEMS:  CONSTITUTIONAL:  Afebrile, 15 pounds weight loss as  mentioned in history of present illness.  HEAD:  No trauma.  EYES:  No  visual changes.  EARS:  No hearing impairment NOSE:  No rhinorrhea.  MOUTH/THROAT:  No sore throat.  NEUROLOGIC:  No focal motor or sensory  deficit. PSYCHIATRIC as above.  CARDIOVASCULAR:  No chest pain,  palpitations.  RESPIRATORY:  No coughing or wheezing.  GASTROINTESTINAL:  No vomiting, diarrhea.  GENITOURINARY:  No dysuria.  SKIN:  Unremarkable.  ENDOCRINE/METABOLIC no heat or cold intolerance.  MUSCULOSKELETAL:  No deformities.  HEMATOLOGIC:  Unremarkable.   EXAMINATION:  VITAL SIGNS:  Temperature 98.2, pulse 87, respiratory rate  16, blood pressure 195/72.  GENERAL APPEARANCE:  Mrs. [name] is an elderly female lying in a  supine position in her hospital bed.  She has no abnormal involuntary  movements.   OTHER MENTAL STATUS EXAM:  Mrs.  [name] is alert.  Her attention span  is slightly decreased.  Her concentration is slightly decreased.  Her  eye contact is good.  Her affect is constricted.  Her mood is depressed.  She is oriented completely to the year, day of the month, day of the  week, place and person.  Memory testing 3/3 words immediately, 1/3 at 5  minutes.  Fund of knowledge and intelligence are within normal limits.  Speech involves normal rate and prosody, no dysarthria.  The volume is  soft.   Thought process logical, coherent, goal-directed.  No looseness of  associations.  Language expression and comprehension are intact,  abstraction is intact.  Thought content no thoughts of harming herself,  no thoughts of harming others no delusions, no hallucinations.  Insight  is good for her depression symptoms as well as her anxiety symptoms.  Judgment is intact.   ASSESSMENT:  AXIS I:  1. 293.84 anxiety disorder not otherwise specified.  2. 296.23 major depressive disorder single episode, severe  AXIS II:  None  AXIS III:  See general medical problems above  AXIS IV:  General medical, primary support group.  AXIS V:  55.   Mrs. [name] is not at risk to harm herself or others.  She agrees to  call emergency services immediately for any thoughts of harming herself  or other psychiatric emergency symptoms.   The undersigned provided ego supportive psychotherapy and education.  The indications, alternatives and adverse effects of the following were  discussed with the patient:  Remeron for anti depression, Xanax for anti  acute anxiety with a goal of eventually eliminating Xanax.   Also discussed for Aricept and/or Namenda for enhancing memory in case  the patient's memory difficulty turns out to be non reversible.   Also discussed was the possibility of later adding Celexa if the patient  still required Xanax after the treatment of her depression.   Cognitive behavioral therapy, progressive muscle  relaxation and deep  breathing training were mentioned as well, as something that the patient  could pursue if she had the time and transportation.  This could result  in synergism in treating her depression as well as anxiety and reduce  the long-term need of Xanax.   The patient understands the above information and wants to proceed  initially as follows.   RECOMMENDATIONS:  1. Start Remeron 7.5 mg p.o. q.h.s. and change Xanax to 0.125 mg p.o.  b.i.d. p.r.n.  2. Would then increase Remeron if tolerated by 7.5 mg per day to the      initial target trial dose of 30 mg q.h.s.   It was discussed that Remeron could potentially have anti acute anxiety  effects although this is an off label possibility.    1. Preliminary discharge planning:  Outpatient psychiatric care can be      found at one of the clinics attached to [facility], [facility] or [facility].   1. Would check a B12 and folic acid as the initial assessment for      reversible memory dysfunction etiologies.  If the patient's memory      workup is negative and she does not recover normal memory function      with depression treatment would consider starting Aricept 5 mg p.o.      q.h.s. and/or Namenda.   Please see other discussion above regarding other possible psychiatric  therapies for the patient in the future.      Antonietta Breach, M.D.  Electronically Signed     JW/MEDQ  D:  01/31/2007  T:  02/01/2007  Job:  [id number]

## 2010-09-08 NOTE — Procedures (Signed)
[facility]  Patient:    [name], [name]                     MRN: [id number] Proc. Date: 10/20/99 Adm. Date:  [id number] Attending:  Estella Husk                           Procedure Report  PROCEDURE:  Esophagogastroduodenoscopy with Savary (guidewire) dilation of the esophagus with fluoroscopic assistance.  INDICATION FOR PROCEDURE:  Dysphagia due to known peptic stricture.  HISTORY OF PRESENT ILLNESS:  This is a 77 year old female with a history of reflux disease complicated by erosive esophagitis and peptic stricture. She has undergone prior esophageal dilation. The last such procedure was performed Sep 08, 1999. At that time, she was dilated to maximal diameter of 16 mm. She was maintained on Prilosec 40 mg daily. Since that time, she reports significant improvement in swallowing. She is now for repeat dilation. The nature of the procedure as well as the risks, benefits, and alternatives were reviewed. She understood and agreed to proceed.  PHYSICAL EXAMINATION:  GENERAL:  Well appearing female in no acute distress. She is alert and oriented.  VITAL SIGNS:  Stable.  LUNGS:  Clear.  HEART:  Regular.  ABDOMEN:  Soft.  DESCRIPTION OF PROCEDURE:  After informed consent was obtained, the patient was sedated with 40 mg of Demerol and 4 mg of Versed IV. The Olympus endoscope was passed orally under direct vision into the esophagus. The esophagus revealed a benign fibrous stricture at the gastroesophageal junction. The mucosa in that region was friable. No frank ulceration. The endoscope passed beyond this region with mild resistance. The stomach revealed a small sliding hiatal hernia but was otherwise normal. The duodenal bulb was normal.  THERAPY:  After completing the endoscopic survey, a Savary guidewire was placed into the gastric antrum under fluoroscopic control. The endoscope was removed with the guidewire maintained in  position. Subsequently 15, 16, and 18 mm dilators were passed sequentially over the guidewire. Fluoroscopy was utilized throughout each portion of each dilation. No resistance was encountered with the passage of any dilator. Scant heme present upon removal of all dilators. The patient tolerated the procedure well.  IMPRESSION:  Gastroesophageal reflux disease complicated by peptic stricture status post Savary dilation to a maximal diameter of 18 mm.  RECOMMENDATION: 1. NPO for 3 hours, then clear liquids for 3 hours and then soft diet    until a.m. 2. Continue Prilosec 40 mg daily. 3. The patient has been instructed to call for any recurrent difficulties    with dysphagia or breakthrough heartburn. DD:  10/20/99 TD:  10/21/99 Job: [id number]

## 2010-09-08 NOTE — Discharge Summary (Signed)
[place]  Patient:    [name]                      MRN: [id number] Adm. Date:  [id number] Disc. Date: 04/10/99 Attending:  Erasmo Leventhal Dictator:   Della Goo, P.A.                           Discharge Summary  ADMISSION DIAGNOSES: 1. Right knee arthrofibrosis with quadriceps contracture. 2. Esophageal strictures. 3. Anxiety disorder. 4. History of right leg deep venous thrombosis in the 1970s. 5. History of right carpal tunnel release.  DISCHARGE DIAGNOSES: 1. Right knee arthrofibrosis with quadriceps contracture. 2. Esophageal strictures. 3. Anxiety disorder. 4. History of right leg deep venous thrombosis in the 1970s. 5. History of right carpal tunnel release.  PROCEDURE:  Right knee manipulation under anesthesia as well as corticosteroid injection to the right knee joint performed by Dr. Thomasena Edis.  CONSULTATIONS:  None.  BRIEF HISTORY:  The patient is a 77 year old white female who is status post right knee patellectomy and repair of quadriceps tendon in September 2000 secondary to an injury.  She was noted postoperatively to have early dystrophy of the right knee with hypersensitivity to the right knee scar.  The patient had slow progress with range of motion following her surgery.  She has been unable to progress with range of motion or strengthening and is felt to need further manipulation under anesthesia with intensive physical therapy to follow.  She was admitted to the hospital to undergo this procedure.  BRIEF HOSPITAL COURSE:  The patient tolerated the procedure without difficulty.  She was given postop epidural for pain control for 48 hours.  She was given Darvocet and Percocet for breakthrough pain and also utilized Xanax for anxiety. The patient was able to do her physical therapy utilizing the CPM machine and ranging 0 to 80 degrees prior to discharge.  She was very pleased with  her progress and had good pain control.  After her epidural was removed, the patient was able to tolerate therapy using p.o. analgesics.  She was afebrile with vital signs stable and able to be discharged to her home on April 10, 1999, for continuation of her care.  PERTINENT LABORATORY VALUES:  On admission, CBC was within normal limits as was  CMET and coagulation studies.  Urinalysis on admission was negative for urinary  tract infection.  There is no chest x-ray or EKG on the chart at the time of this dictation.  PLAN:  The patient is being discharged to her home.  Arrangements have been made for her to have physical therapy at [facility] daily for the next week.  She will follow up with Dr. Thomasena Edis in  approximately 10 days for a recheck.  She will also utilize the CPM machine at ome which will be delivered to her home the afternoon of her discharge or, at the latest, the next morning.  The patient has been advised to call our office if she does not receive her CPM machine in a timely manner.  The patient will continue to ambulate, weightbearing as tolerated.  She will also work on active range of motion at home.  She and her daughter have been advised to call the office if there are further questions or concerns prior to her return office visit.  DISCHARGE MEDICATIONS: 1. Percocet 5 mg 1 to  2 every 4-6h. as needed for pain. 2. Xanax 0.25 mg 1 every 8 hours as needed for anxiety.  She did not require any muscle relaxers as these usually cause her to have stomach upset. DD:  04/10/99 TD:  04/11/99 Job: [id number]

## 2010-09-08 NOTE — Procedures (Signed)
[facility]  Patient:    [name], [name]                     MRN: [id number] Proc. Date: 09/08/99 Adm. Date:  [id number] Disc. Date: [id number] Attending:  Estella Husk                           Procedure Report  PROCEDURE PERFORMED:  Esophagogastroduodenoscopy with Savary (guide wire) dilation of the esophagus with fluoroscopic assistance.  ENDOSCOPIST:  [name]. Eda Keys., M.D. [facility]  INDICATIONS FOR PROCEDURE:  Symptomatic peptic stricture.  HISTORY OF PRESENT ILLNESS:  The patient is a 77 year old female with a history of reflux disease complicated by erosive esophagitis and esophageal stricture formation who has undergone upper endoscopy with esophageal dilation on two prior occasions.  The most recent dilation was performed in December of 1999.  She was dilated to a maximal diameter of 18 mm.  She did well until earlier this year when she began to have minor intermittent problems with solid food dysphagia.  This has progressed.  She is maintained on Prilosec 40 mg daily.  She contacted the office requesting esophageal dilation.  The nature of the procedure as well as the risks, benefits and alternatives were again reviewed.  She agreed to proceed.  PHYSICAL EXAMINATION:  Well-appearing female in no acute distress.  She is alert and oriented.  Vital signs are stable.  Lungs are clear.  Heart is regular.  Abdomen is soft.  DESCRIPTION OF PROCEDURE:  After informed consent was obtained, the patient was sedated with 50 mg of Demerol and 5 mg of Versed IV.  The Olympus endoscope was passed orally under direct vision into the esophagus.  The esophagus revealed a fibrous distal stricture at the gastroesophageal junction.  This measured approximately 11 mm.  There was associated mucosal esophagitis.  The endoscope passed beyond this region with minimal resistance. The stomach, duodenal bulb and postbulbar duodenum were unremarkable.  THERAPY:   After completing the endoscopic survey, a Savary guidewire was placed into the gastric antrum under fluoroscopic control.  The endoscope was removed with the guide wire maintained in position.  Subsequently, 14, 15, and 16 mm dilators were passed sequentially over the guide wire.  Fluoroscopy was utilized throughout each portion of each dilation.  No significant resistance encountered with the passage of any dilator.  Scant heme present upon removal of the larger two dilators.  The patient tolerated the procedure well.  IMPRESSION:  Gastroesophageal reflux disease complicated by peptic stricture, status post Savary dilation to a maximum diameter of 16 mm.  RECOMMENDATIONS: 1. N.p.o. for three hours, then clear liquids for three hours, then soft    foods until a.m.  She should advance her diet thereafter as tolerated. 2. Continue Prilosec 40 mg p.o. q.d. 3. Repeat dilation in approximately one months.  The patient will coordinate    this with our office. DD:  09/08/99 TD:  09/13/99 Job: [id number]

## 2010-09-08 NOTE — Op Note (Signed)
[place]. [facility]  Patient:    [name]                      MRN: [id number] Proc. Date: 04/07/99 Adm. Date:  [id number] Attending:  Erasmo Leventhal CC:         R. Valma Cava, M.D.                           Operative Report  PREOPERATIVE DIAGNOSIS:  Right knee postoperative arthrofibrosis.  POSTOPERATIVE DIAGNOSIS:  Right knee postoperative arthrofibrosis.  PROCEDURE:  Right knee manipulation under anesthesia with a corticosteroid injection.  SURGEON:  R. Valma Cava, M.D.  ANESTHESIA:  General endotracheal with postoperative epidural.  COMPLICATIONS:  None.  DISPOSITION:  PACU, stable.  OPERATIVE DETAILS:  The patient was counselled in the holding area, the correct  surgical site was identified, and all paperwork was signed appropriately.  She as taken to the operating room and placed in the supine position under general endotracheal anesthesia.  At this point in time, the knee was examined.  She had range of motion of 5-30 degrees.  Gentle manipulation was performed with palpable release of adhesions.  The knee was sequentially inspected to make sure that there were no fractures, and there were none encountered, and the knee remained stable after gentle manipulation under anesthesia, which was slowly done.  She had range of motion of 5-110 degrees.  The collateral ligaments were stable.  The cruciate ligament was stable.  Palpation of the extensor mechanism revealed no palpable defects.  She was then gently awakened enough to follow commands, and she could do a straight leg raise in the operating room.  At this time, I felt the extensor mechanism was intact and tight.  There were no complications or problems.  The kin was then prepped with Betadine.  I injected the area between the quadriceps and the anterior femur with ______ and 10 cc of lidocaine.  This was done sterilely. She was then turned into the lateral  position, where the postoperative epidural catheter was administered by Dr. Ivin Booty.  She was then awakened and extubated and taken from the operating room to the PACU in stable condition.  There were no complications. DD:  04/07/99 TD:  04/10/99 Job: [id number]

## 2010-09-25 ENCOUNTER — Telehealth: Payer: Self-pay

## 2010-09-25 NOTE — Telephone Encounter (Signed)
Pt scheduled for EGD with savary dil with c-arm at [facility] for 10/31/10@9am . Pt to arrive at 8am. Pt aware of appt date and time. Instructions mailed to pt.

## 2010-09-25 NOTE — Telephone Encounter (Signed)
Message copied by Michele Mcalpine on Mon Sep 25, 2010 11:02 AM ------      Message from: HUNT, LINDA R. R      Created: Thu Jul 20, 2010  9:45 AM      Regarding: EGD with dil Savary with C-arm at [facility]       Repeat EGD with dil at [facility] needed around June/July

## 2010-10-31 ENCOUNTER — Ambulatory Visit (HOSPITAL_COMMUNITY)
Admission: RE | Admit: 2010-10-31 | Discharge: 2010-10-31 | Disposition: A | Discharge status: Home / Self Care | Payer: Medicare Other | Source: Ambulatory Visit | Attending: Internal Medicine | Admitting: Internal Medicine

## 2010-10-31 ENCOUNTER — Ambulatory Visit (HOSPITAL_COMMUNITY): Payer: Medicare Other

## 2010-10-31 ENCOUNTER — Encounter: Payer: No Typology Code available for payment source | Admitting: Internal Medicine

## 2010-10-31 DIAGNOSIS — K219 Gastro-esophageal reflux disease without esophagitis: Secondary | ICD-10-CM

## 2010-10-31 DIAGNOSIS — K222 Esophageal obstruction: Secondary | ICD-10-CM | POA: Insufficient documentation

## 2010-11-01 ENCOUNTER — Telehealth: Payer: Self-pay | Admitting: Internal Medicine

## 2010-11-02 ENCOUNTER — Telehealth: Payer: Self-pay | Admitting: Internal Medicine

## 2010-11-02 NOTE — Telephone Encounter (Signed)
Called patient and spoke with daughter.  I advised her that we are out of Nexium samples at this time.  We are expecting them to come in anytime.  She states that her mom is not completely out and she will check back with me on Monday to see if the samples have come in.

## 2011-01-29 ENCOUNTER — Telehealth: Payer: Self-pay | Admitting: Internal Medicine

## 2011-01-29 MED ORDER — ESOMEPRAZOLE MAGNESIUM 40 MG PO CPDR: 40.0000 mg | DELAYED_RELEASE_CAPSULE | Freq: Every day | ORAL | Status: DC

## 2011-01-29 NOTE — Telephone Encounter (Signed)
Nexium samples left up front for pick-up for pt.  Daughter is calling to schedule her mother for egd with dil c-arm at the hospital. Last procedure done 10/31/10. Requesting an early am appt for her mother. When would you like to schedule this......Marland KitchenDr. Marina Goodell please advise.

## 2011-01-29 NOTE — Telephone Encounter (Signed)
Tuesday, October 30 at 8:15 or 8:30 AM. Block my first 2 office appointments that day (8:30 and 8:45 AM). Please confirm

## 2011-01-30 NOTE — Telephone Encounter (Signed)
Pt scheduled for egd with dil c-arm at [facility] 02/20/11 @8 :15am. Dr. Lamar Sprinkles office schedule blocked for the 8:30 and 8:45am slots. Pt to arrive at the hospital at 7:15am. Prep instructions mailed to the pt. Left message for daughter to call back regarding appt date and time.

## 2011-01-31 NOTE — Telephone Encounter (Signed)
Spoke with pt and mailed her the prep instructions to her home. Pt scheduled for 02/20/11@[facility]  arrival time 7:15am, procedure time 8:15am. Dr. Lamar Sprinkles schedule blocked per his request.

## 2011-01-31 NOTE — Telephone Encounter (Signed)
Left message for Daughter to return myc all 

## 2011-02-01 LAB — CK TOTAL AND CKMB (NOT AT ARMC)
CK, MB: 1.4
Relative Index: INVALID
Total CK: 34

## 2011-02-01 LAB — URINALYSIS, ROUTINE W REFLEX MICROSCOPIC
Glucose, UA: NEGATIVE
Ketones, ur: 40 — AB
Nitrite: NEGATIVE
Protein, ur: NEGATIVE

## 2011-02-01 LAB — BASIC METABOLIC PANEL
CO2: 28
Calcium: 9.2
Chloride: 102
Glucose, Bld: 111 — ABNORMAL HIGH
Sodium: 134 — ABNORMAL LOW

## 2011-02-01 LAB — URINE CULTURE

## 2011-02-01 LAB — CBC
Hemoglobin: 13.6
MCHC: 34.6
MCV: 97.9
RBC: 4.03
WBC: 5.8

## 2011-02-01 LAB — I-STAT 8, (EC8 V) (CONVERTED LAB)
Acid-Base Excess: 3 — ABNORMAL HIGH
BUN: 6
Chloride: 97
HCT: 40
Operator id: 198171
Potassium: 3 — ABNORMAL LOW
pCO2, Ven: 29.4 — ABNORMAL LOW
pH, Ven: 7.536 — ABNORMAL HIGH

## 2011-02-01 LAB — HEPATIC FUNCTION PANEL
ALT: 12
AST: 17
Alkaline Phosphatase: 39
Indirect Bilirubin: 0.6
Total Protein: 5.5 — ABNORMAL LOW

## 2011-02-01 LAB — DIFFERENTIAL
Basophils Relative: 0
Eosinophils Absolute: 0
Lymphs Abs: 1.2
Monocytes Absolute: 0.3
Monocytes Relative: 6

## 2011-02-01 LAB — URINE MICROSCOPIC-ADD ON

## 2011-02-02 LAB — CBC
HCT: 40.1
Hemoglobin: 13.8
MCHC: 34.3
MCV: 98.1
Platelets: 227
RBC: 4.09
RDW: 12.8
WBC: 5.4

## 2011-02-02 LAB — URINALYSIS, ROUTINE W REFLEX MICROSCOPIC
Bilirubin Urine: NEGATIVE
Glucose, UA: NEGATIVE
Hgb urine dipstick: NEGATIVE
Ketones, ur: 15 — AB
Nitrite: NEGATIVE
Protein, ur: NEGATIVE
Specific Gravity, Urine: 1.012
Urobilinogen, UA: 0.2
pH: 5.5

## 2011-02-02 LAB — COMPREHENSIVE METABOLIC PANEL
ALT: 14
AST: 20
Albumin: 4.3
Alkaline Phosphatase: 38 — ABNORMAL LOW
BUN: 3 — ABNORMAL LOW
CO2: 26
Calcium: 9.4
Chloride: 98
Creatinine, Ser: 0.87
GFR calc Af Amer: >60
GFR calc non Af Amer: >60
Glucose, Bld: 129 — ABNORMAL HIGH
Potassium: 3.8
Sodium: 134 — ABNORMAL LOW
Total Bilirubin: 0.8
Total Protein: 6.4

## 2011-02-02 LAB — DIFFERENTIAL
Basophils Absolute: 0
Basophils Relative: 0
Eosinophils Absolute: 0
Eosinophils Relative: 0
Lymphocytes Relative: 25
Lymphs Abs: 1.3
Monocytes Absolute: 0.2
Monocytes Relative: 5
Neutro Abs: 3.8
Neutrophils Relative %: 70

## 2011-02-02 LAB — LIPASE, BLOOD: Lipase: 23

## 2011-02-02 LAB — AMYLASE: Amylase: 45

## 2011-02-20 ENCOUNTER — Ambulatory Visit (HOSPITAL_COMMUNITY)
Admission: RE | Admit: 2011-02-20 | Discharge: 2011-02-20 | Disposition: A | Discharge status: Home / Self Care | Payer: Medicare Other | Source: Ambulatory Visit | Attending: Internal Medicine | Admitting: Internal Medicine

## 2011-02-20 ENCOUNTER — Ambulatory Visit (HOSPITAL_COMMUNITY): Payer: Medicare Other

## 2011-02-20 ENCOUNTER — Encounter: Payer: No Typology Code available for payment source | Admitting: Internal Medicine

## 2011-02-20 DIAGNOSIS — K449 Diaphragmatic hernia without obstruction or gangrene: Secondary | ICD-10-CM | POA: Insufficient documentation

## 2011-02-20 DIAGNOSIS — K219 Gastro-esophageal reflux disease without esophagitis: Secondary | ICD-10-CM

## 2011-02-20 DIAGNOSIS — R1310 Dysphagia, unspecified: Secondary | ICD-10-CM

## 2011-02-20 DIAGNOSIS — K222 Esophageal obstruction: Secondary | ICD-10-CM | POA: Insufficient documentation

## 2011-02-27 NOTE — Telephone Encounter (Signed)
done

## 2011-05-21 ENCOUNTER — Telehealth: Payer: Self-pay | Admitting: Internal Medicine

## 2011-05-21 NOTE — Telephone Encounter (Signed)
My next hospital week, which is first week in March

## 2011-05-21 NOTE — Telephone Encounter (Signed)
Pts daughter is calling to schedule her EGD with dil at the hospital. Last procedure was done 02/20/11.Pt is not complaining of difficulty swallowing. Dr. Marina Goodell when would you like to look at scheduling this.........Marland KitchenPlease advise.

## 2011-05-22 ENCOUNTER — Other Ambulatory Visit: Payer: Self-pay | Admitting: Internal Medicine

## 2011-05-22 MED ORDER — ESOMEPRAZOLE MAGNESIUM 40 MG PO CPDR: 40.0000 mg | DELAYED_RELEASE_CAPSULE | Freq: Every day | ORAL | Status: DC

## 2011-05-22 NOTE — Telephone Encounter (Signed)
Samples up front for pick-up. Pt scheduled for EGD with dil/c-arm @[facility]  06/26/11@8 :30am. Pt to arrive at 7:30am, 8:30am procedure time. Pt to be NPO after midnight. Left message for daughter to call back.

## 2011-05-22 NOTE — Telephone Encounter (Signed)
Spoke with pts daughter and she will pick up the Nexium samples. She is aware of the appt date and time and prep for the procedure.

## 2011-06-26 ENCOUNTER — Ambulatory Visit (HOSPITAL_COMMUNITY)
Admission: RE | Admit: 2011-06-26 | Discharge: 2011-06-26 | Disposition: A | Discharge status: Home / Self Care | Payer: Medicare Other | Source: Ambulatory Visit | Attending: Internal Medicine | Admitting: Internal Medicine

## 2011-06-26 ENCOUNTER — Ambulatory Visit (HOSPITAL_COMMUNITY): Payer: Medicare Other

## 2011-06-26 ENCOUNTER — Encounter (HOSPITAL_COMMUNITY): Payer: Self-pay

## 2011-06-26 ENCOUNTER — Encounter (HOSPITAL_COMMUNITY)
Admission: RE | Disposition: A | Discharge status: Home / Self Care | Payer: Self-pay | Source: Ambulatory Visit | Attending: Internal Medicine

## 2011-06-26 DIAGNOSIS — K222 Esophageal obstruction: Secondary | ICD-10-CM | POA: Insufficient documentation

## 2011-06-26 DIAGNOSIS — K219 Gastro-esophageal reflux disease without esophagitis: Secondary | ICD-10-CM

## 2011-06-26 DIAGNOSIS — K449 Diaphragmatic hernia without obstruction or gangrene: Secondary | ICD-10-CM | POA: Insufficient documentation

## 2011-06-26 HISTORY — DX: Essential (primary) hypertension: I10

## 2011-06-26 HISTORY — DX: Gastro-esophageal reflux disease without esophagitis: K21.9

## 2011-06-26 HISTORY — PX: ESOPHAGOGASTRODUODENOSCOPY: SHX5428

## 2011-06-26 HISTORY — DX: Major depressive disorder, single episode, unspecified: F32.9

## 2011-06-26 HISTORY — DX: Unspecified osteoarthritis, unspecified site: M19.90

## 2011-06-26 HISTORY — PX: SAVORY DILATION: SHX5439

## 2011-06-26 HISTORY — DX: Depression, unspecified: F32.A

## 2011-06-26 SURGERY — EGD (ESOPHAGOGASTRODUODENOSCOPY)
Anesthesia: Moderate Sedation

## 2011-06-26 MED ORDER — BUTAMBEN-TETRACAINE-BENZOCAINE 2-2-14 % EX AERO
INHALATION_SPRAY | CUTANEOUS | Status: DC | PRN
  Administered 2011-06-26: 2 via TOPICAL

## 2011-06-26 MED ORDER — FENTANYL CITRATE 0.05 MG/ML IJ SOLN
INTRAMUSCULAR | Status: AC
  Filled 2011-06-26: qty 2

## 2011-06-26 MED ORDER — FENTANYL NICU IV SYRINGE 50 MCG/ML
INJECTION | INTRAMUSCULAR | Status: DC | PRN
  Administered 2011-06-26 (×2): 25 ug via INTRAVENOUS

## 2011-06-26 MED ORDER — SODIUM CHLORIDE 0.9 % IV SOLN
INTRAVENOUS | Status: DC
  Administered 2011-06-26: 08:00:00 via INTRAVENOUS

## 2011-06-26 MED ORDER — MIDAZOLAM HCL 10 MG/2ML IJ SOLN
INTRAMUSCULAR | Status: DC | PRN
  Administered 2011-06-26: 1 mg via INTRAVENOUS
  Administered 2011-06-26: 2 mg via INTRAVENOUS
  Administered 2011-06-26: 1 mg via INTRAVENOUS

## 2011-06-26 MED ORDER — MIDAZOLAM HCL 10 MG/2ML IJ SOLN
INTRAMUSCULAR | Status: AC
  Filled 2011-06-26: qty 2

## 2011-06-26 NOTE — H&P (Signed)
  Patient here today for EGD with dilation for chronic peptic stricture. Still with some intermittent dysphagia. Otherwise no change in clinical status since last procedure. PHYSICAL EXAM COMPLETED TODAY AS ABOVE. The nature of the procedure, as well as the risks, benefits, and alternatives were carefully and thoroughly reviewed with the patient. Ample time for discussion and questions allowed. The patient understood, was satisfied, and agreed to proceed.   [name]. Eda Keys., M.D. [facility] Division of Gastroenterology

## 2011-06-26 NOTE — Discharge Instructions (Addendum)
Endoscopy Care After Please read the instructions outlined below and refer to this sheet in the next few weeks. These discharge instructions provide you with general information on caring for yourself after you leave the hospital. Your doctor may also give you specific instructions. While your treatment has been planned according to the most current medical practices available, unavoidable complications occasionally occur. If you have any problems or questions after discharge, please call your doctor. HOME CARE INSTRUCTIONS Activity  You may resume your regular activity but move at a slower pace for the next 24 hours.   Take frequent rest periods for the next 24 hours.   Walking will help expel (get rid of) the air and reduce the bloated feeling in your abdomen.   No driving for 24 hours (because of the anesthesia (medicine) used during the test).   You may shower.   Do not sign any important legal documents or operate any machinery for 24 hours (because of the anesthesia used during the test).  Nutrition  Drink plenty of fluids.   You may resume your normal diet.   Begin with a light meal and progress to your normal diet.   Avoid alcoholic beverages for 24 hours or as instructed by your caregiver.  Medications You may resume your normal medications unless your caregiver tells you otherwise. What you can expect today  You may experience abdominal discomfort such as a feeling of fullness or "gas" pains.   You may experience a sore throat for 2 to 3 days. This is normal. Gargling with salt water may help this.  Follow-up Your doctor will discuss the results of your test with you. SEEK IMMEDIATE MEDICAL CARE IF:  You have excessive nausea (feeling sick to your stomach) and/or vomiting.   You have severe abdominal pain and distention (swelling).   You have trouble swallowing.   You have a temperature over 100 F (37.8 C).   You have rectal bleeding or vomiting of blood.    Document Released: 11/22/2003 Document Revised: 03/29/2011 Document Reviewed: 06/04/2007 Exit[employer] Patient Information 2012 [employer]. 

## 2011-06-26 NOTE — Op Note (Signed)
[facility] [address] [place], [place]  [address]  ENDOSCOPY PROCEDURE REPORT  PATIENT:  [name], [name]  MR#:  [id number] BIRTHDATE:  [birth year], 88 yrs. old  GENDER:  female  ENDOSCOPIST:  [name]. Eda Keys, MD Referred by:  .Direct  PROCEDURE DATE:  06/26/2011 PROCEDURE:  EGD with Savory dilation over a guidewire - ASA CLASS:  Class II INDICATIONS:  dysphagia, dilation of esophageal stricture  MEDICATIONS:   Fentanyl 50 mcg IV, Versed 4 mg IV TOPICAL ANESTHETIC:  Cetacaine Spray  DESCRIPTION OF PROCEDURE:   After the risks benefits and alternatives of the procedure were thoroughly explained, informed consent was obtained.  The Pentax Gastroscope [id number] endoscope was introduced through the mouth and advanced to the second portion of the duodenum, without limitations.  The instrument was slowly withdrawn as the mucosa was fully examined. <<PROCEDUREIMAGES>>  A 59m focal stricture was found in the distal esophagus.  No inflammation.Otherwise the examination was normal to D2. Retroflexed views revealed a hiatal hernia.  THERAPY; SAVARY GUIDEWIRE INTO GASTRIC ANTRUM UNDER FLUOROSCOPIC GUIDANCE. SUBSEQUENT SAVARY DILATION OF 18 MM W/O RESISTANCE OR HEME. TOLERATED WELL.   The scope was then withdrawn from the patient and the procedure completed.  COMPLICATIONS:  None  ENDOSCOPIC IMPRESSION: 1) Stricture in the distal esophagus - S/P DILATION 2) Hiatal hernia 3) Otherwise normal examination  RECOMMENDATIONS: 1) Clear liquids until 11AM, then soft foods rest of day. Resume prior diet tomorrow. 2) Continue PRILOSEC 3) EGD/DILATION at HOSPITAL IN 3-4 MONTHS. CONTACT TE OFFICE TO ARRANGE  ______________________________ [name]. Eda Keys, MD  CC:  Maurice Small, MD; The Patient  n. eSIGNED:   [name]. Eda Keys at 06/26/2011 09:19 AM  Doylene Bode, [phone]

## 2011-06-27 ENCOUNTER — Encounter (HOSPITAL_COMMUNITY): Payer: Self-pay | Admitting: Internal Medicine

## 2011-09-25 ENCOUNTER — Telehealth: Payer: Self-pay | Admitting: Internal Medicine

## 2011-09-25 NOTE — Telephone Encounter (Signed)
My next hospital week (1st week of July)

## 2011-09-25 NOTE — Telephone Encounter (Signed)
Daughter states that her mother needs to be scheduled for another EGD with dil at the hospital. Pt would like an early morning appt. Dr. Marina Goodell please advise when you would like to schedule this procedure.  Pt would also like Nexium samples if we have any.

## 2011-09-27 ENCOUNTER — Other Ambulatory Visit: Payer: Self-pay | Admitting: Internal Medicine

## 2011-09-27 NOTE — Telephone Encounter (Signed)
Pt scheduled for EGD with savary dil/Fluro with Aurther Loft at [facility] 10/22/11 arrival time 7:30am for an 8:30am procedure. Pt aware of appt date and time. Prep instructions mailed to pt.

## 2011-10-22 ENCOUNTER — Encounter (HOSPITAL_COMMUNITY): Payer: Self-pay | Admitting: Internal Medicine

## 2011-10-22 ENCOUNTER — Ambulatory Visit (HOSPITAL_COMMUNITY): Payer: Medicare Other

## 2011-10-22 ENCOUNTER — Encounter (HOSPITAL_COMMUNITY)
Admission: RE | Disposition: A | Discharge status: Home / Self Care | Payer: Self-pay | Source: Ambulatory Visit | Attending: Internal Medicine

## 2011-10-22 ENCOUNTER — Ambulatory Visit (HOSPITAL_COMMUNITY)
Admission: RE | Admit: 2011-10-22 | Discharge: 2011-10-22 | Disposition: A | Discharge status: Home / Self Care | Payer: Medicare Other | Source: Ambulatory Visit | Attending: Internal Medicine | Admitting: Internal Medicine

## 2011-10-22 DIAGNOSIS — K219 Gastro-esophageal reflux disease without esophagitis: Secondary | ICD-10-CM

## 2011-10-22 DIAGNOSIS — I10 Essential (primary) hypertension: Secondary | ICD-10-CM | POA: Insufficient documentation

## 2011-10-22 DIAGNOSIS — K449 Diaphragmatic hernia without obstruction or gangrene: Secondary | ICD-10-CM | POA: Insufficient documentation

## 2011-10-22 DIAGNOSIS — K222 Esophageal obstruction: Secondary | ICD-10-CM

## 2011-10-22 HISTORY — PX: SAVORY DILATION: SHX5439

## 2011-10-22 HISTORY — PX: ESOPHAGOGASTRODUODENOSCOPY: SHX5428

## 2011-10-22 SURGERY — EGD (ESOPHAGOGASTRODUODENOSCOPY)
Anesthesia: Moderate Sedation

## 2011-10-22 MED ORDER — BUTAMBEN-TETRACAINE-BENZOCAINE 2-2-14 % EX AERO
INHALATION_SPRAY | CUTANEOUS | Status: DC | PRN
  Administered 2011-10-22: 2 via TOPICAL

## 2011-10-22 MED ORDER — SODIUM CHLORIDE 0.9 % IV SOLN: Freq: Once | INTRAVENOUS | Status: DC

## 2011-10-22 MED ORDER — MIDAZOLAM HCL 10 MG/2ML IJ SOLN
INTRAMUSCULAR | Status: AC
  Filled 2011-10-22: qty 2

## 2011-10-22 MED ORDER — FENTANYL CITRATE 0.05 MG/ML IJ SOLN
INTRAMUSCULAR | Status: AC
  Filled 2011-10-22: qty 2

## 2011-10-22 MED ORDER — MIDAZOLAM HCL 10 MG/2ML IJ SOLN
INTRAMUSCULAR | Status: DC | PRN
  Administered 2011-10-22: 1 mg via INTRAVENOUS
  Administered 2011-10-22: 2 mg via INTRAVENOUS

## 2011-10-22 MED ORDER — FENTANYL CITRATE 0.05 MG/ML IJ SOLN
INTRAMUSCULAR | Status: DC | PRN
  Administered 2011-10-22 (×2): 25 ug via INTRAVENOUS

## 2011-10-22 NOTE — Discharge Instructions (Addendum)
Endoscopy Care After Please read the instructions outlined below and refer to this sheet in the next few weeks. These discharge instructions provide you with general information on caring for yourself after you leave the hospital. Your doctor may also give you specific instructions. While your treatment has been planned according to the most current medical practices available, unavoidable complications occasionally occur. If you have any problems or questions after discharge, please call your doctor. HOME CARE INSTRUCTIONS Activity  You may resume your regular activity but move at a slower pace for the next 24 hours.   Take frequent rest periods for the next 24 hours.   Walking will help expel (get rid of) the air and reduce the bloated feeling in your abdomen.   No driving for 24 hours (because of the anesthesia (medicine) used during the test).   You may shower.   Do not sign any important legal documents or operate any machinery for 24 hours (because of the anesthesia used during the test).  Nutrition  Drink plenty of fluids.   You may resume your normal diet.   Begin with a light meal and progress to your normal diet.   Avoid alcoholic beverages for 24 hours or as instructed by your caregiver.  Medications You may resume your normal medications unless your caregiver tells you otherwise. What you can expect today  You may experience abdominal discomfort such as a feeling of fullness or "gas" pains.   You may experience a sore throat for 2 to 3 days. This is normal. Gargling with salt water may help this.  Follow-up Your doctor will discuss the results of your test with you. SEEK IMMEDIATE MEDICAL CARE IF:  You have excessive nausea (feeling sick to your stomach) and/or vomiting.   You have severe abdominal pain and distention (swelling).   You have trouble swallowing.   You have a temperature over 100 F (37.8 C).   You have rectal bleeding or vomiting of blood.    Document Released: 11/22/2003 Document Revised: 03/29/2011 Document Reviewed: 06/04/2007 Exit[employer] Patient Information 2012 [employer]. 

## 2011-10-22 NOTE — H&P (Signed)
  HISTORY OF PRESENT ILLNESS:  [name] is a 78 y.o. female presents for EGD w/ dilation for symptomatic peptic stricture. Last exam 06-26-11. No interval change in medical history. Some mild dysphagia.  REVIEW OF SYSTEMS:  All non-GI ROS negative  Past Medical History  Diagnosis Date  . Arthritis   . Depression   . GERD (gastroesophageal reflux disease)   . Hypertension     Past Surgical History  Procedure Date  . Tonsillectomy   . Esophagogastroduodenoscopy     with dil, multiple times  . Esophagogastroduodenoscopy 06/26/2011    Procedure: ESOPHAGOGASTRODUODENOSCOPY (EGD);  Surgeon: [name], MD;  Location: [facility];  Service: Endoscopy;  Laterality: N/A;  with c-arm  . Savory dilation 06/26/2011    Procedure: SAVORY DILATION;  Surgeon: [name], MD;  Location: [facility];  Service: Endoscopy;  Laterality: N/A;    Social History [name]  reports that she has never smoked. She does not have any smokeless tobacco history on file. She reports that she drinks alcohol. She reports that she does not use illicit drugs.  family history is not on file.  Allergies  Allergen Reactions  . Codeine Swelling       PHYSICAL EXAMINATION: Vital signs: There were no vitals taken for this visit. General: Well-developed, well-nourished, no acute distress HEENT: Sclerae are anicteric, conjunctiva pink. Oral mucosa intact Lungs: Clear Heart: Regular Abdomen: soft, nontender, nondistended, no obvious ascites, no peritoneal signs, normal bowel sounds. No organomegaly. Extremities: No edema Psychiatric: alert and oriented x3. Cooperative      ASSESSMENT:  1. GERD with symptomatic peptic stricture     PLAN:  1. EGD w/ Savary dilation.The nature of the procedure, as well as the risks, benefits, and alternatives were carefully and thoroughly reviewed with the patient. Ample time for discussion and questions allowed. The patient understood, was satisfied, and  agreed to proceed.

## 2011-10-22 NOTE — Op Note (Signed)
[facility] [address] [place], [place]  [address]  ENDOSCOPY PROCEDURE REPORT  PATIENT:  [name], [name]  MR#:  [id number] BIRTHDATE:  January 26, 1923, 88 yrs. old  GENDER:  female  ENDOSCOPIST:  [name]. Eda Keys, MD Referred by:  .Direct  PROCEDURE DATE:  10/22/2011 PROCEDURE:  EGD with dilatation over guidewire - 18mm ASA CLASS:  Class II INDICATIONS:  dysphagia, dilation of esophageal stricture ; last exam 06-26-11  MEDICATIONS:   Fentanyl 50 mcg IV, Versed 4 mg IV TOPICAL ANESTHETIC:  Cetacaine Spray  DESCRIPTION OF PROCEDURE:   After the risks benefits and alternatives of the procedure were thoroughly explained, informed consent was obtained.  The Pentax Gastroscope [id number] endoscope was introduced through the mouth and advanced to the second portion of the duodenum, without limitations.  The instrument was slowly withdrawn as the mucosa was fully examined. <<PROCEDUREIMAGES>>  A 12-43mm stricture was found in the distal esophagus.  Otherwise the examination was normal to D2.    Retroflexed views revealed a small hiatal hernia.  THERAPY: SAVARY GUIDEWIRE PLACED IN ANTRUM FLUOROSCOPICALLY. DILATOR PASSED OVER WIRE W/O RESISTANCE OR HEME. TOLERATED WELL.  COMPLICATIONS:  None  ENDOSCOPIC IMPRESSION: 1) Stricture in the distal esophagus 2) Hiatal hernia 3) Otherwise normal examination 4) Gerd  RECOMMENDATIONS: 1) Clear liquids until 11AM, then soft foods rest of day. Resume prior diet tomorrow. 2) Continue PRILOSEC 3) FOLLOW UP EGD/SAVARY AT HOSPITAL FOR REPEAT DILATION IN 3-4 MONTHS  ______________________________ [name]. Eda Keys, MD  CC:  Maurice Small, MD; The Patient  n. eSIGNED:   [name]. Eda Keys at 10/22/2011 08:56 AM  Doylene Bode, [phone]

## 2011-10-23 ENCOUNTER — Encounter (HOSPITAL_COMMUNITY): Payer: Self-pay | Admitting: Internal Medicine

## 2011-11-28 ENCOUNTER — Telehealth: Payer: Self-pay | Admitting: Internal Medicine

## 2011-11-28 NOTE — Telephone Encounter (Signed)
This was sent to me, and it looks like Pt see's Dr. Marina Goodell.

## 2011-11-29 ENCOUNTER — Telehealth: Payer: Self-pay

## 2011-11-29 NOTE — Telephone Encounter (Signed)
At patient's request, left samples of Nexium up front.  Pt's mother will come pick them up.

## 2012-02-07 ENCOUNTER — Telehealth: Payer: Self-pay | Admitting: Internal Medicine

## 2012-02-07 MED ORDER — ESOMEPRAZOLE MAGNESIUM 40 MG PO CPDR: 40.0000 mg | DELAYED_RELEASE_CAPSULE | Freq: Every day | ORAL | Status: DC

## 2012-02-07 NOTE — Telephone Encounter (Signed)
Pts daughter is requesting for her mother to have an egd with dil at the hospital. Interested in perhaps 03/04/12 early am. Requesting Nexium samples also. Dr. Marina Goodell please advise regarding EGD with dil.

## 2012-02-08 NOTE — Telephone Encounter (Signed)
EGD with dilation, C-arm, [facility] is okay on that morning. Okay to give samples if you have him. Thanks

## 2012-02-11 ENCOUNTER — Other Ambulatory Visit: Payer: Self-pay | Admitting: Internal Medicine

## 2012-02-11 DIAGNOSIS — K222 Esophageal obstruction: Secondary | ICD-10-CM

## 2012-02-11 NOTE — Telephone Encounter (Signed)
Pt scheduled for EGD with dil/c-arm 03/04/12, pt to arrive at 8a, for a 9am appt. Daughter aware of appt date and time. Prep instructions mailed to pt. Samples left up front for pt.

## 2012-03-04 ENCOUNTER — Encounter (HOSPITAL_COMMUNITY): Payer: Self-pay

## 2012-03-04 ENCOUNTER — Ambulatory Visit (HOSPITAL_COMMUNITY)
Admission: RE | Admit: 2012-03-04 | Discharge: 2012-03-04 | Disposition: A | Discharge status: Home / Self Care | Payer: Medicare Other | Source: Ambulatory Visit | Attending: Internal Medicine | Admitting: Internal Medicine

## 2012-03-04 ENCOUNTER — Ambulatory Visit (HOSPITAL_COMMUNITY): Payer: Medicare Other

## 2012-03-04 ENCOUNTER — Encounter (HOSPITAL_COMMUNITY): Payer: Self-pay | Admitting: *Deleted

## 2012-03-04 ENCOUNTER — Encounter (HOSPITAL_COMMUNITY)
Admission: RE | Disposition: A | Discharge status: Home / Self Care | Payer: Self-pay | Source: Ambulatory Visit | Attending: Internal Medicine

## 2012-03-04 DIAGNOSIS — R1310 Dysphagia, unspecified: Secondary | ICD-10-CM | POA: Insufficient documentation

## 2012-03-04 DIAGNOSIS — K219 Gastro-esophageal reflux disease without esophagitis: Secondary | ICD-10-CM | POA: Insufficient documentation

## 2012-03-04 DIAGNOSIS — K222 Esophageal obstruction: Secondary | ICD-10-CM | POA: Insufficient documentation

## 2012-03-04 DIAGNOSIS — I10 Essential (primary) hypertension: Secondary | ICD-10-CM | POA: Insufficient documentation

## 2012-03-04 DIAGNOSIS — K449 Diaphragmatic hernia without obstruction or gangrene: Secondary | ICD-10-CM | POA: Insufficient documentation

## 2012-03-04 HISTORY — PX: ESOPHAGOGASTRODUODENOSCOPY: SHX5428

## 2012-03-04 HISTORY — PX: SAVORY DILATION: SHX5439

## 2012-03-04 SURGERY — EGD (ESOPHAGOGASTRODUODENOSCOPY)
Anesthesia: Moderate Sedation

## 2012-03-04 MED ORDER — FENTANYL CITRATE 0.05 MG/ML IJ SOLN
INTRAMUSCULAR | Status: AC
  Filled 2012-03-04: qty 2

## 2012-03-04 MED ORDER — MIDAZOLAM HCL 10 MG/2ML IJ SOLN
INTRAMUSCULAR | Status: AC
  Filled 2012-03-04: qty 2

## 2012-03-04 MED ORDER — MIDAZOLAM HCL 10 MG/2ML IJ SOLN
INTRAMUSCULAR | Status: DC | PRN
  Administered 2012-03-04 (×2): 2 mg via INTRAVENOUS
  Administered 2012-03-04: 1 mg via INTRAVENOUS

## 2012-03-04 MED ORDER — FENTANYL CITRATE 0.05 MG/ML IJ SOLN
INTRAMUSCULAR | Status: DC | PRN
  Administered 2012-03-04 (×2): 25 ug via INTRAVENOUS

## 2012-03-04 MED ORDER — BUTAMBEN-TETRACAINE-BENZOCAINE 2-2-14 % EX AERO
INHALATION_SPRAY | CUTANEOUS | Status: DC | PRN
  Administered 2012-03-04: 2 via TOPICAL

## 2012-03-04 NOTE — H&P (Signed)
  HISTORY OF PRESENT ILLNESS:  [name] is a 78 y.o. female with hypertension, anxiety, and GERD complicated by esophageal stricture requiring frequent esophageal dilation. She was seen several months ago for esophageal dilation. She presents now with slightly progressive dysphagia and repeat dilation. Her GI review of systems is otherwise negative. No interval medical problems. Chronic medical problems are stable  REVIEW OF SYSTEMS:  All non-GI ROS negative except for arthralgias  Past Medical History  Diagnosis Date  . Arthritis   . GERD (gastroesophageal reflux disease)   . Hypertension   . Depression     Past Surgical History  Procedure Date  . Tonsillectomy   . Esophagogastroduodenoscopy     with dil, multiple times  . Esophagogastroduodenoscopy 06/26/2011    Procedure: ESOPHAGOGASTRODUODENOSCOPY (EGD);  Surgeon: [name], MD;  Location: [facility];  Service: Endoscopy;  Laterality: N/A;  with c-arm  . Savory dilation 06/26/2011    Procedure: SAVORY DILATION;  Surgeon: [name], MD;  Location: [facility];  Service: Endoscopy;  Laterality: N/A;  . Esophagogastroduodenoscopy 10/22/2011    Procedure: ESOPHAGOGASTRODUODENOSCOPY (EGD);  Surgeon: Hilarie Fredrickson, MD;  Location: [facility];  Service: Endoscopy;  Laterality: N/A;  . Savory dilation 10/22/2011    Procedure: SAVORY DILATION;  Surgeon: Hilarie Fredrickson, MD;  Location: [facility];  Service: Endoscopy;  Laterality: N/A;    Social History Jadesola K Glotfelty  reports that she has never smoked. She does not have any smokeless tobacco history on file. She reports that she does not drink alcohol or use illicit drugs.  family history is not on file.  Allergies  Allergen Reactions  . Codeine Swelling       PHYSICAL EXAMINATION: Vital signs: BP 175/78  Temp 98.2 F (36.8 C) (Oral)  Resp 20  Ht 5\' 3"  (1.6 m)  Wt 116 lb (52.617 kg)  BMI 20.55 kg/m2  SpO2 100% General: Well-developed, well-nourished, no acute  distress HEENT: Sclerae are anicteric, conjunctiva pink. Oral mucosa intact Lungs: Clear Heart: Regular Abdomen: soft, nontender, nondistended, no obvious ascites, no peritoneal signs, normal bowel sounds. No organomegaly. Extremities: No edema Psychiatric: alert and oriented x3. Cooperative      ASSESSMENT:  #1. GERD complicated by peptic stricture #2. Recurrent dysphagia   PLAN:  #1. Upper endoscopy with esophageal dilation.The nature of the procedure, as well as the risks, benefits, and alternatives were carefully and thoroughly reviewed with the patient. Ample time for discussion and questions allowed. The patient understood, was satisfied, and agreed to proceed.

## 2012-03-04 NOTE — Op Note (Signed)
[facility] [address] [place], [address]   ENDOSCOPY PROCEDURE REPORT  PATIENT: Connie, [name]  MR#: [id number] BIRTHDATE: February 07, 1923 , 89  yrs. old GENDER: Female ENDOSCOPIST: Roxy Cedar, MD REFERRED BY:  .  Self-Direct PROCEDURE DATE:  03/04/2012 PROCEDURE:  EGD with Savary dilation of esophagus   - 18mm ASA CLASS:     Class II INDICATIONS:  dysphagia.   therapeutic procedure. MEDICATIONS: Fentanyl 50 mcg IV and Versed 5 mg IV TOPICAL ANESTHETIC: Cetacaine Spray  DESCRIPTION OF PROCEDURE: After the risks benefits and alternatives of the procedure were thoroughly explained, informed consent was obtained.  The Pentax Gastroscope [id number] endoscope was introduced through the mouth and advanced to the second portion of the duodenum. Without limitations.  The instrument was slowly withdrawn as the mucosa was fully examined.      The esophagus revealed a 12 mm distal ringlike stricture.  The stomach revealed atrophic mucosa, antral erythema, and a hiatal hernia.  The duodenal bulb and post bulbar duodenum were normal. A retroflexed view revealed the hiatal hernia  THERAPY: A savory guidewire was placed into the gastric antrum under fluoroscopic control.  The endoscope was removed with the guidewire maintained in position.  An 18 mm dilator was passed over the guidewire utilizing fluoroscopy.  No significant resistance.  No heme.  Tolerated well.  a.    The scope was then withdrawn from the patient and the procedure completed.  COMPLICATIONS: There were no complications. ENDOSCOPIC IMPRESSION: 1. Peptic stricture status post dilation to 18 mm 2. GERD  RECOMMENDATIONS: 1.  Clear liquids until 11:30 AM , then soft foods restiof day. Resume prior diet tomorrow. 2.  Continue PPI 3.  EGD/DILATION at HOSPITAL about 3-4 months.  REPEAT EXAM:  eSigned:  Roxy Cedar, MD 03/04/2012 9:31 AM   XB:[id number] Valentina Lucks, MD and The Patient

## 2012-03-05 ENCOUNTER — Encounter (HOSPITAL_COMMUNITY): Payer: Self-pay | Admitting: Internal Medicine

## 2012-05-30 ENCOUNTER — Other Ambulatory Visit: Payer: Self-pay | Admitting: Internal Medicine

## 2012-05-30 ENCOUNTER — Telehealth: Payer: Self-pay | Admitting: Internal Medicine

## 2012-05-30 DIAGNOSIS — R1310 Dysphagia, unspecified: Secondary | ICD-10-CM

## 2012-05-30 NOTE — Telephone Encounter (Signed)
Pt scheduled for EGD with savary dil at [facility] 07/15/12. Pt to arrive at 8am for a 9am appt. Pts daughter aware of appt date and time. Prep instructions mailed to daughter. Scheduled with Noreene Larsson [phone].

## 2012-05-30 NOTE — Telephone Encounter (Signed)
My hospital week in March would be fine.  (FYI, We need to keep the number of outpatient's that week limited as we are on unassigned at [facility] cone that month). Thanks

## 2012-05-30 NOTE — Telephone Encounter (Signed)
Daughter states she would like to have it done the month of March. Are you hospital doc the week of March 24th?

## 2012-05-30 NOTE — Telephone Encounter (Signed)
Pts daughter called and would like to schedule her mother at the hospital for an EGD with dil. Dr. Marina Goodell when would you like this to be scheduled? Please advise.

## 2012-05-30 NOTE — Telephone Encounter (Signed)
This coming Wednesday morning at [place] [facility] when I am in the hospital would be best for me. I have 1 case that morning, currently. Thanks

## 2012-07-15 ENCOUNTER — Ambulatory Visit (HOSPITAL_COMMUNITY)
Admission: RE | Admit: 2012-07-15 | Discharge: 2012-07-15 | Disposition: A | Discharge status: Home / Self Care | Payer: Medicare Other | Source: Ambulatory Visit | Attending: Internal Medicine | Admitting: Internal Medicine

## 2012-07-15 ENCOUNTER — Encounter (HOSPITAL_COMMUNITY)
Admission: RE | Disposition: A | Discharge status: Home / Self Care | Payer: Self-pay | Source: Ambulatory Visit | Attending: Internal Medicine

## 2012-07-15 ENCOUNTER — Ambulatory Visit (HOSPITAL_COMMUNITY): Payer: Medicare Other

## 2012-07-15 ENCOUNTER — Encounter (HOSPITAL_COMMUNITY): Payer: Self-pay

## 2012-07-15 DIAGNOSIS — K222 Esophageal obstruction: Secondary | ICD-10-CM | POA: Insufficient documentation

## 2012-07-15 DIAGNOSIS — R1310 Dysphagia, unspecified: Secondary | ICD-10-CM | POA: Insufficient documentation

## 2012-07-15 DIAGNOSIS — K219 Gastro-esophageal reflux disease without esophagitis: Secondary | ICD-10-CM | POA: Insufficient documentation

## 2012-07-15 DIAGNOSIS — I10 Essential (primary) hypertension: Secondary | ICD-10-CM | POA: Insufficient documentation

## 2012-07-15 HISTORY — PX: ESOPHAGOGASTRODUODENOSCOPY: SHX5428

## 2012-07-15 HISTORY — PX: SAVORY DILATION: SHX5439

## 2012-07-15 SURGERY — EGD (ESOPHAGOGASTRODUODENOSCOPY)
Anesthesia: Moderate Sedation

## 2012-07-15 MED ORDER — FENTANYL CITRATE 0.05 MG/ML IJ SOLN
INTRAMUSCULAR | Status: AC
  Filled 2012-07-15: qty 4

## 2012-07-15 MED ORDER — BUTAMBEN-TETRACAINE-BENZOCAINE 2-2-14 % EX AERO
INHALATION_SPRAY | CUTANEOUS | Status: DC | PRN
  Administered 2012-07-15: 2 via TOPICAL

## 2012-07-15 MED ORDER — SODIUM CHLORIDE 0.9 % IV SOLN: INTRAVENOUS | Status: DC

## 2012-07-15 MED ORDER — FENTANYL CITRATE 0.05 MG/ML IJ SOLN
INTRAMUSCULAR | Status: DC | PRN
  Administered 2012-07-15 (×2): 25 ug via INTRAVENOUS

## 2012-07-15 MED ORDER — MIDAZOLAM HCL 10 MG/2ML IJ SOLN
INTRAMUSCULAR | Status: DC | PRN
  Administered 2012-07-15 (×2): 2 mg via INTRAVENOUS

## 2012-07-15 MED ORDER — MIDAZOLAM HCL 10 MG/2ML IJ SOLN
INTRAMUSCULAR | Status: AC
  Filled 2012-07-15: qty 4

## 2012-07-15 NOTE — Op Note (Signed)
[facility] [address] [place], [address]   ENDOSCOPY PROCEDURE REPORT  PATIENT: Denajah, Farias  MR#: [id number] BIRTHDATE: 02/27/1923 , 89  yrs. old GENDER: Female ENDOSCOPIST: Roxy Cedar, MD REFERRED BY:  .Direct Self PROCEDURE DATE:  07/15/2012 PROCEDURE:  Savary dilation of esophagus  - 18mm with Fluoroscopy ASA CLASS:     Class II INDICATIONS:  Therapeutic procedure.   Dysphagia. MEDICATIONS: Fentanyl 50 mcg IV and Versed 4 mg IV TOPICAL ANESTHETIC: Cetacaine Spray  DESCRIPTION OF PROCEDURE: After the risks benefits and alternatives of the procedure were thoroughly explained, informed consent was obtained.  The Pentax [id number] [id number] endoscope was introduced through the mouth and advanced to the second portion of the duodenum. Without limitations.  The instrument was slowly withdrawn as the mucosa was fully examined.      Distal 13mm stricture.  Small hiatal hernia.  anatral erythema. otherwise normal.  Therapy: 18mm Savary dilation over quidewire with fluoroscopy.Mild resistance.No heme.  Tolerated well. Retroflexed views revealed .Marland Kitchen     The scope was then withdrawn from the patient and the procedure completed.  COMPLICATIONS: There were no complications. ENDOSCOPIC IMPRESSION: 1. Distal 13mm stricture s/p dilation 2. GERD.  RECOMMENDATIONS: 1.  Clear liquids until 11am , then soft foods rest of day.  Resume prior diet tomorrow. 2.  EGD/DILATION at HOSPITAL in 3-4 months  REPEAT EXAM:  eSigned:  Roxy Cedar, MD 07/15/2012 9:46 AM   GE:[id number] Valentina Lucks, MD and The Patient

## 2012-07-15 NOTE — H&P (Signed)
  HISTORY OF PRESENT ILLNESS:  [name] is a 79 y.o. female with peptic stricture and dysphagia. For EGD / dilation for same. No interval change since last exam  REVIEW OF SYSTEMS:  All non-GI ROS negative   Past Medical History  Diagnosis Date  . Arthritis   . GERD (gastroesophageal reflux disease)   . Hypertension   . Depression     Past Surgical History  Procedure Laterality Date  . Tonsillectomy    . Esophagogastroduodenoscopy      with dil, multiple times  . Esophagogastroduodenoscopy  06/26/2011    Procedure: ESOPHAGOGASTRODUODENOSCOPY (EGD);  Surgeon: [name], MD;  Location: [facility];  Service: Endoscopy;  Laterality: N/A;  with c-arm  . Savory dilation  06/26/2011    Procedure: SAVORY DILATION;  Surgeon: [name], MD;  Location: [facility];  Service: Endoscopy;  Laterality: N/A;  . Esophagogastroduodenoscopy  10/22/2011    Procedure: ESOPHAGOGASTRODUODENOSCOPY (EGD);  Surgeon: Hilarie Fredrickson, MD;  Location: [facility];  Service: Endoscopy;  Laterality: N/A;  . Savory dilation  10/22/2011    Procedure: SAVORY DILATION;  Surgeon: Hilarie Fredrickson, MD;  Location: [facility];  Service: Endoscopy;  Laterality: N/A;  . Esophagogastroduodenoscopy  03/04/2012    Procedure: ESOPHAGOGASTRODUODENOSCOPY (EGD);  Surgeon: Hilarie Fredrickson, MD;  Location: [facility];  Service: Endoscopy;  Laterality: N/A;  . Savory dilation  03/04/2012    Procedure: SAVORY DILATION;  Surgeon: Hilarie Fredrickson, MD;  Location: [facility];  Service: Endoscopy;  Laterality: N/A;    Social History [name]  reports that she has never smoked. She does not have any smokeless tobacco history on file. She reports that she does not drink alcohol or use illicit drugs.  family history is not on file.  Allergies  Allergen Reactions  . Codeine Swelling       PHYSICAL EXAMINATION:  Vital signs: BP 167/80  Temp(Src) 98.5 F (36.9 C) (Oral)  Resp 14  SpO2 99% General: Well-developed,  well-nourished, no acute distress HEENT: Sclerae are anicteric, conjunctiva pink. Oral mucosa intact Lungs: Clear Heart: Regular Abdomen: soft, nontender, nondistended, no obvious ascites, no peritoneal signs, normal bowel sounds. No organomegaly. Extremities: No edema Psychiatric: alert and oriented x3. Cooperative    ASSESSMENT: 1. GERD with dysphagia  PLAN:  1. EGD dilation.The nature of the procedure, as well as the risks, benefits, and alternatives were carefully and thoroughly reviewed with the patient. Ample time for discussion and questions allowed. The patient understood, was satisfied, and agreed to proceed.  [name]. [name]., M.D. [facility]

## 2012-07-15 NOTE — Op Note (Signed)
EGD DILATION. SEE PENTAX REPORT (NOW FROZEN IN HYPERSPACE)

## 2012-07-16 ENCOUNTER — Encounter (HOSPITAL_COMMUNITY): Payer: Self-pay | Admitting: Internal Medicine

## 2012-08-10 ENCOUNTER — Encounter (HOSPITAL_COMMUNITY): Payer: Self-pay

## 2012-08-10 ENCOUNTER — Emergency Department (HOSPITAL_COMMUNITY)
Admission: EM | Admit: 2012-08-10 | Discharge: 2012-08-10 | Disposition: A | Discharge status: Home / Self Care | Payer: Medicare Other | Attending: Emergency Medicine | Admitting: Emergency Medicine

## 2012-08-10 ENCOUNTER — Emergency Department (HOSPITAL_COMMUNITY): Payer: Medicare Other

## 2012-08-10 DIAGNOSIS — M1990 Unspecified osteoarthritis, unspecified site: Secondary | ICD-10-CM | POA: Insufficient documentation

## 2012-08-10 DIAGNOSIS — K219 Gastro-esophageal reflux disease without esophagitis: Secondary | ICD-10-CM | POA: Insufficient documentation

## 2012-08-10 DIAGNOSIS — Z79899 Other long term (current) drug therapy: Secondary | ICD-10-CM | POA: Insufficient documentation

## 2012-08-10 DIAGNOSIS — Y929 Unspecified place or not applicable: Secondary | ICD-10-CM | POA: Insufficient documentation

## 2012-08-10 DIAGNOSIS — S8253XA Displaced fracture of medial malleolus of unspecified tibia, initial encounter for closed fracture: Secondary | ICD-10-CM | POA: Insufficient documentation

## 2012-08-10 DIAGNOSIS — F3289 Other specified depressive episodes: Secondary | ICD-10-CM | POA: Insufficient documentation

## 2012-08-10 DIAGNOSIS — F329 Major depressive disorder, single episode, unspecified: Secondary | ICD-10-CM | POA: Insufficient documentation

## 2012-08-10 DIAGNOSIS — S82401A Unspecified fracture of shaft of right fibula, initial encounter for closed fracture: Secondary | ICD-10-CM

## 2012-08-10 DIAGNOSIS — X58XXXA Exposure to other specified factors, initial encounter: Secondary | ICD-10-CM | POA: Insufficient documentation

## 2012-08-10 DIAGNOSIS — S8251XA Displaced fracture of medial malleolus of right tibia, initial encounter for closed fracture: Secondary | ICD-10-CM

## 2012-08-10 DIAGNOSIS — S8263XA Displaced fracture of lateral malleolus of unspecified fibula, initial encounter for closed fracture: Secondary | ICD-10-CM | POA: Insufficient documentation

## 2012-08-10 DIAGNOSIS — Y939 Activity, unspecified: Secondary | ICD-10-CM | POA: Insufficient documentation

## 2012-08-10 DIAGNOSIS — I10 Essential (primary) hypertension: Secondary | ICD-10-CM | POA: Insufficient documentation

## 2012-08-10 NOTE — ED Provider Notes (Signed)
79 year old female noted the pain and swelling of her right ankle. She was fine at around lunchtime. She does not remember any injury. On exam, there is swelling and ecchymosis over the lateral aspect of the right ankle with tenderness over that area. There is no instability. X-ray shows oblique fracture of the distal fibula with mild displacement and nondisplaced fracture the medial malleolus. Family is with her and she does have a walker at home. She will be placed in a Cam Walker and referred to orthopedics.  Medical screening examination/treatment/procedure(s) were conducted as a shared visit with non-physician practitioner(s) and myself.  I personally evaluated the patient during the encounter   Dione Booze, MD 08/10/12 [phone]

## 2012-08-10 NOTE — ED Notes (Signed)
Pt presents with pain to rt ankle. Deformity present to rt ankle.  Unknown injury. Pedal pulse present

## 2012-08-10 NOTE — ED Provider Notes (Signed)
History     CSN: [id number]  Arrival date & time 08/10/12  1446   First MD Initiated Contact with Patient 08/10/12 1455      Chief Complaint  Patient presents with  . Ankle Pain    (Consider location/radiation/quality/duration/timing/severity/associated sxs/prior treatment) HPI Comments: Patient presents to the ED for right ankle pain starting earlier this afternoon. Patient does not recall any injury or trauma area.  States she went to the bathroom and looked down and noticed that her ankle was swollen.  When she attempted to put pressure on her foot again, there was a sharp pain.  She crawled to the couch and called family members for help.  Pt notes that she has had problems with her ankle in the past but denies any surgeries.  No numbness or paresthesias to right foot.  Pt does not endorse pain until she attempts to bear weight.  Ankle iced and elevated in room.  Pt lives alone and there were no witness of earlier events today.  Patient is a 79 y.o. female presenting with ankle pain. The history is provided by the patient.  Ankle Pain   Past Medical History  Diagnosis Date  . Arthritis   . GERD (gastroesophageal reflux disease)   . Hypertension   . Depression     Past Surgical History  Procedure Laterality Date  . Tonsillectomy    . Esophagogastroduodenoscopy      with dil, multiple times  . Esophagogastroduodenoscopy  06/26/2011    Procedure: ESOPHAGOGASTRODUODENOSCOPY (EGD);  Surgeon: [name], MD;  Location: [facility];  Service: Endoscopy;  Laterality: N/A;  with c-arm  . Savory dilation  06/26/2011    Procedure: SAVORY DILATION;  Surgeon: [name], MD;  Location: [facility];  Service: Endoscopy;  Laterality: N/A;  . Esophagogastroduodenoscopy  10/22/2011    Procedure: ESOPHAGOGASTRODUODENOSCOPY (EGD);  Surgeon: Hilarie Fredrickson, MD;  Location: [facility];  Service: Endoscopy;  Laterality: N/A;  . Savory dilation  10/22/2011    Procedure: SAVORY DILATION;  Surgeon: Hilarie Fredrickson, MD;  Location: [facility];  Service: Endoscopy;  Laterality: N/A;  . Esophagogastroduodenoscopy  03/04/2012    Procedure: ESOPHAGOGASTRODUODENOSCOPY (EGD);  Surgeon: Hilarie Fredrickson, MD;  Location: [facility];  Service: Endoscopy;  Laterality: N/A;  . Savory dilation  03/04/2012    Procedure: SAVORY DILATION;  Surgeon: Hilarie Fredrickson, MD;  Location: [facility];  Service: Endoscopy;  Laterality: N/A;  . Esophagogastroduodenoscopy N/A 07/15/2012    Procedure: ESOPHAGOGASTRODUODENOSCOPY (EGD);  Surgeon: Hilarie Fredrickson, MD;  Location: [facility];  Service: Endoscopy;  Laterality: N/A;  . Savory dilation N/A 07/15/2012    Procedure: SAVORY DILATION;  Surgeon: Hilarie Fredrickson, MD;  Location: [facility];  Service: Endoscopy;  Laterality: N/A;    No family history on file.  History  Substance Use Topics  . Smoking status: Never Smoker   . Smokeless tobacco: Not on file  . Alcohol Use: No    OB History   Grav Para Term Preterm Abortions TAB SAB Ect Mult Living                  Review of Systems  Musculoskeletal: Positive for joint swelling and arthralgias.  All other systems reviewed and are negative.    Allergies  Codeine  Home Medications   Current Outpatient Rx  Name  Route  Sig  Dispense  Refill  . ALPRAZolam (XANAX) 0.25 MG tablet   Oral   Take 0.25 mg by mouth as needed.         Marland Kitchen  esomeprazole (NEXIUM) 40 MG capsule   Oral   Take 1 capsule (40 mg total) by mouth daily.   30 capsule   0     Samples left up front for pt. Lot# [id number] EXP:  ...   . mirtazapine (REMERON) 30 MG tablet   Oral   Take 30 mg by mouth at bedtime.         Marland Kitchen omeprazole (PRILOSEC) 20 MG capsule   Oral   Take 20 mg by mouth daily.         . raloxifene (EVISTA) 60 MG tablet   Oral   Take 60 mg by mouth daily.         . valsartan (DIOVAN) 160 MG tablet   Oral   Take 160 mg by mouth daily.           BP 156/73  Pulse 98  Temp(Src) 98.7 F (37.1 C)  Resp 18  Ht  5\' 3"  (1.6 m)  Wt 113 lb (51.256 kg)  BMI 20.02 kg/m2  SpO2 98%  Physical Exam  Nursing note and vitals reviewed. Constitutional: She is oriented to person, place, and time. She appears well-developed and well-nourished.  HENT:  Head: Normocephalic and atraumatic.  Mouth/Throat: Oropharynx is clear and moist.  Eyes: Conjunctivae and EOM are normal. Pupils are equal, round, and reactive to light.  Neck: Normal range of motion. Neck supple.  Cardiovascular: Normal rate, regular rhythm and normal heart sounds.   Pulmonary/Chest: Effort normal and breath sounds normal.  Musculoskeletal: Normal range of motion.       Right ankle: She exhibits swelling and deformity. She exhibits normal range of motion, no ecchymosis, no laceration and normal pulse. Tenderness. Lateral malleolus and medial malleolus tenderness found.  Swelling and deformity noted to R ankle, no bruising, lacerations or abrasions; normal sensation, strong distal pulse and cap refill  Neurological: She is alert and oriented to person, place, and time.  Skin: Skin is warm and dry.  Psychiatric: She has a normal mood and affect.    ED Course  Procedures (including critical care time)  Labs Reviewed - No data to display Dg Ankle Complete Right  08/10/2012  *RADIOLOGY REPORT*  Clinical Data: Fall, lateral ankle pain, swelling.  RIGHT ANKLE - COMPLETE 3+ VIEW  Comparison: None  Findings: There is an oblique fracture of the distal right fibula at the level of the ankle mortise.  Transverse medial malleolar fracture.  The distal fibular fracture is mildly displaced.  Marked diffuse soft tissue swelling.  IMPRESSION: Distal fibular and medial malleolar fractures.   Original Report Authenticated By: Charlett Nose, M.D.      1. Fibula fracture, right, closed, initial encounter   2. Medial malleolar fracture, right, closed, initial encounter       MDM   Pt presenting to the ED with R ankle swelling with obvious deformity. Does not  recall specific injury or fall.    X-ray as above.  Pt neurovascularly intact.  Cam walker applied in the ED, pt has a walker at home that she will use.  Pt will also stay with her family for the time being.  Pt will be referred to orthopedics- Dr. Lajoyce Corners.  Family expresses that they might like to use [facility] which is also acceptable.  Pt does not want narcotics as she states they make her too drowsy, she will take tylenol PRN.  Encouraged to follow RICE routine to help reduce swelling.    Discussed plan with pt  and family- they agreed.  Dr. Preston Fleeting personally evaluated pt and agrees with plan.  Return precautions advised.       Garlon Hatchet, PA-C 08/10/12 1851

## 2012-08-18 ENCOUNTER — Other Ambulatory Visit: Payer: Self-pay | Admitting: Physician Assistant

## 2012-08-18 ENCOUNTER — Inpatient Hospital Stay (HOSPITAL_COMMUNITY): Payer: Medicare Other

## 2012-08-18 ENCOUNTER — Inpatient Hospital Stay (HOSPITAL_COMMUNITY)
Admission: AD | Admit: 2012-08-18 | Discharge: 2012-08-25 | DRG: 493 | Disposition: A | Discharge status: Skilled Nursing Facility | Payer: Medicare Other | Source: Ambulatory Visit | Attending: Internal Medicine | Admitting: Internal Medicine

## 2012-08-18 ENCOUNTER — Encounter: Payer: Self-pay | Admitting: Physician Assistant

## 2012-08-18 DIAGNOSIS — F3289 Other specified depressive episodes: Secondary | ICD-10-CM | POA: Diagnosis present

## 2012-08-18 DIAGNOSIS — F329 Major depressive disorder, single episode, unspecified: Secondary | ICD-10-CM | POA: Diagnosis present

## 2012-08-18 DIAGNOSIS — L03119 Cellulitis of unspecified part of limb: Secondary | ICD-10-CM | POA: Diagnosis present

## 2012-08-18 DIAGNOSIS — D649 Anemia, unspecified: Secondary | ICD-10-CM | POA: Diagnosis present

## 2012-08-18 DIAGNOSIS — I491 Atrial premature depolarization: Secondary | ICD-10-CM

## 2012-08-18 DIAGNOSIS — I471 Supraventricular tachycardia, unspecified: Secondary | ICD-10-CM | POA: Diagnosis present

## 2012-08-18 DIAGNOSIS — K219 Gastro-esophageal reflux disease without esophagitis: Secondary | ICD-10-CM | POA: Diagnosis present

## 2012-08-18 DIAGNOSIS — S82891D Other fracture of right lower leg, subsequent encounter for closed fracture with routine healing: Secondary | ICD-10-CM

## 2012-08-18 DIAGNOSIS — I10 Essential (primary) hypertension: Secondary | ICD-10-CM

## 2012-08-18 DIAGNOSIS — I4891 Unspecified atrial fibrillation: Secondary | ICD-10-CM | POA: Diagnosis not present

## 2012-08-18 DIAGNOSIS — M1990 Unspecified osteoarthritis, unspecified site: Secondary | ICD-10-CM | POA: Diagnosis present

## 2012-08-18 DIAGNOSIS — L03115 Cellulitis of right lower limb: Secondary | ICD-10-CM | POA: Diagnosis present

## 2012-08-18 DIAGNOSIS — Y92009 Unspecified place in unspecified non-institutional (private) residence as the place of occurrence of the external cause: Secondary | ICD-10-CM

## 2012-08-18 DIAGNOSIS — Z9181 History of falling: Secondary | ICD-10-CM

## 2012-08-18 DIAGNOSIS — M810 Age-related osteoporosis without current pathological fracture: Secondary | ICD-10-CM

## 2012-08-18 DIAGNOSIS — S82891G Other fracture of right lower leg, subsequent encounter for closed fracture with delayed healing: Secondary | ICD-10-CM

## 2012-08-18 DIAGNOSIS — L02619 Cutaneous abscess of unspecified foot: Secondary | ICD-10-CM | POA: Diagnosis present

## 2012-08-18 DIAGNOSIS — S8290XD Unspecified fracture of unspecified lower leg, subsequent encounter for closed fracture with routine healing: Secondary | ICD-10-CM

## 2012-08-18 DIAGNOSIS — W19XXXA Unspecified fall, initial encounter: Secondary | ICD-10-CM | POA: Diagnosis present

## 2012-08-18 DIAGNOSIS — S82891S Other fracture of right lower leg, sequela: Secondary | ICD-10-CM

## 2012-08-18 DIAGNOSIS — M84469A Pathological fracture, unspecified tibia and fibula, initial encounter for fracture: Principal | ICD-10-CM | POA: Diagnosis present

## 2012-08-18 DIAGNOSIS — S82891A Other fracture of right lower leg, initial encounter for closed fracture: Secondary | ICD-10-CM

## 2012-08-18 DIAGNOSIS — F32A Depression, unspecified: Secondary | ICD-10-CM | POA: Diagnosis present

## 2012-08-18 LAB — CBC
MCH: 32.9 pg (ref 26.0–34.0)
MCV: 91.9 fL (ref 78.0–100.0)
Platelets: 234 10*3/uL (ref 150–400)
RBC: 3.22 MIL/uL — ABNORMAL LOW (ref 3.87–5.11)

## 2012-08-18 LAB — COMPREHENSIVE METABOLIC PANEL
ALT: 9 U/L (ref 0–35)
BUN: 13 mg/dL (ref 6–23)
Calcium: 8.9 mg/dL (ref 8.4–10.5)
GFR calc Af Amer: 54 mL/min — ABNORMAL LOW (ref >90)
Glucose, Bld: 104 mg/dL — ABNORMAL HIGH (ref 70–99)
Sodium: 136 mEq/L (ref 135–145)
Total Protein: 6.3 g/dL (ref 6.0–8.3)

## 2012-08-18 LAB — CBC WITH DIFFERENTIAL/PLATELET
Basophils Relative: 1 % (ref 0–1)
Eosinophils Absolute: 0 10*3/uL (ref 0.0–0.7)
Eosinophils Relative: 1 % (ref 0–5)
Lymphs Abs: 1.2 10*3/uL (ref 0.7–4.0)
MCH: 33 pg (ref 26.0–34.0)
MCHC: 36 g/dL (ref 30.0–36.0)
MCV: 91.8 fL (ref 78.0–100.0)
Platelets: 229 10*3/uL (ref 150–400)
RBC: 3.18 MIL/uL — ABNORMAL LOW (ref 3.87–5.11)

## 2012-08-18 LAB — TYPE AND SCREEN
ABO/RH(D): A POS
Antibody Screen: NEGATIVE

## 2012-08-18 LAB — PROTIME-INR: Prothrombin Time: 12.9 seconds (ref 11.6–15.2)

## 2012-08-18 MED ORDER — LACTATED RINGERS IV SOLN: INTRAVENOUS | Status: DC

## 2012-08-18 MED ORDER — SODIUM CHLORIDE 0.9 % IJ SOLN: 3.0000 mL | INTRAMUSCULAR | Status: DC | PRN

## 2012-08-18 MED ORDER — ONDANSETRON HCL 4 MG PO TABS: 4.0000 mg | ORAL_TABLET | Freq: Four times a day (QID) | ORAL | Status: DC | PRN

## 2012-08-18 MED ORDER — ONDANSETRON HCL 4 MG/2ML IJ SOLN: 4.0000 mg | Freq: Four times a day (QID) | INTRAMUSCULAR | Status: DC | PRN

## 2012-08-18 MED ORDER — SODIUM CHLORIDE 0.9 % IV SOLN
250.0000 mL | INTRAVENOUS | Status: DC | PRN
  Administered 2012-08-24: 250 mL via INTRAVENOUS

## 2012-08-18 MED ORDER — CALCIUM CARBONATE-VITAMIN D 500-200 MG-UNIT PO TABS
1.0000 | ORAL_TABLET | Freq: Two times a day (BID) | ORAL | Status: DC
  Administered 2012-08-18 – 2012-08-25 (×14): 1 via ORAL
  Filled 2012-08-18 (×15): qty 1

## 2012-08-18 MED ORDER — ACETAMINOPHEN 325 MG PO TABS
650.0000 mg | ORAL_TABLET | Freq: Four times a day (QID) | ORAL | Status: DC | PRN
  Administered 2012-08-19 – 2012-08-20 (×4): 650 mg via ORAL
  Filled 2012-08-18 (×4): qty 2

## 2012-08-18 MED ORDER — HEPARIN SODIUM (PORCINE) 5000 UNIT/ML IJ SOLN
5000.0000 [IU] | Freq: Three times a day (TID) | INTRAMUSCULAR | Status: AC
  Administered 2012-08-18 – 2012-08-20 (×6): 5000 [IU] via SUBCUTANEOUS
  Filled 2012-08-18 (×8): qty 1

## 2012-08-18 MED ORDER — VANCOMYCIN HCL IN DEXTROSE 1-5 GM/200ML-% IV SOLN
1000.0000 mg | INTRAVENOUS | Status: DC
  Administered 2012-08-20: 1000 mg via INTRAVENOUS
  Filled 2012-08-18 (×2): qty 200

## 2012-08-18 MED ORDER — PANTOPRAZOLE SODIUM 40 MG PO TBEC
40.0000 mg | DELAYED_RELEASE_TABLET | Freq: Two times a day (BID) | ORAL | Status: DC
  Administered 2012-08-19 – 2012-08-25 (×13): 40 mg via ORAL
  Filled 2012-08-18 (×13): qty 1

## 2012-08-18 MED ORDER — SODIUM CHLORIDE 0.9 % IJ SOLN
3.0000 mL | Freq: Two times a day (BID) | INTRAMUSCULAR | Status: DC
  Administered 2012-08-23: 3 mL via INTRAVENOUS
  Administered 2012-08-24: 10 mL via INTRAVENOUS
  Administered 2012-08-25: 3 mL via INTRAVENOUS

## 2012-08-18 MED ORDER — MIRTAZAPINE 30 MG PO TABS
30.0000 mg | ORAL_TABLET | Freq: Every day | ORAL | Status: DC
  Administered 2012-08-18 – 2012-08-24 (×7): 30 mg via ORAL
  Filled 2012-08-18 (×8): qty 1

## 2012-08-18 MED ORDER — SODIUM CHLORIDE 0.9 % IJ SOLN: 3.0000 mL | Freq: Two times a day (BID) | INTRAMUSCULAR | Status: DC

## 2012-08-18 MED ORDER — ACETAMINOPHEN 650 MG RE SUPP: 650.0000 mg | Freq: Four times a day (QID) | RECTAL | Status: DC | PRN

## 2012-08-18 MED ORDER — IRBESARTAN 150 MG PO TABS
150.0000 mg | ORAL_TABLET | Freq: Every day | ORAL | Status: DC
  Administered 2012-08-18 – 2012-08-21 (×4): 150 mg via ORAL
  Filled 2012-08-18 (×4): qty 1

## 2012-08-18 MED ORDER — RALOXIFENE HCL 60 MG PO TABS
60.0000 mg | ORAL_TABLET | Freq: Every day | ORAL | Status: DC
  Administered 2012-08-19 – 2012-08-25 (×7): 60 mg via ORAL
  Filled 2012-08-18 (×7): qty 1

## 2012-08-18 MED ORDER — PIPERACILLIN-TAZOBACTAM 3.375 G IVPB
3.3750 g | Freq: Three times a day (TID) | INTRAVENOUS | Status: DC
  Administered 2012-08-19 – 2012-08-22 (×10): 3.375 g via INTRAVENOUS
  Filled 2012-08-18 (×17): qty 50

## 2012-08-18 NOTE — Progress Notes (Signed)
ANTIBIOTIC CONSULT NOTE - INITIAL  Pharmacy Consult for Vanco/Zosyn Indication: R foot cellulitis  Allergies  Allergen Reactions  . Codeine Swelling    Patient Measurements: Height: 5\' 3"  (160 cm) Weight: 113 lb (51.256 kg) IBW/kg (Calculated) : 52.4 Adjusted Body Weight:    Vital Signs: Temp: 98.9 F (37.2 C) (04/28 1747) BP: 160/67 mmHg (04/28 1747) Pulse Rate: 77 (04/28 1747) Intake/Output from previous day:   Intake/Output from this shift:    Labs:  Recent Labs  08/18/12 1803  WBC 5.2  HGB 10.5*  PLT 229   Estimated Creatinine Clearance: 40.1 ml/min (by C-G formula based on Cr of 0.8). No results found for this basename: VANCOTROUGH, VANCOPEAK, VANCORANDOM, GENTTROUGH, GENTPEAK, GENTRANDOM, TOBRATROUGH, TOBRAPEAK, TOBRARND, AMIKACINPEAK, AMIKACINTROU, AMIKACIN,  in the last 72 hours   Microbiology: No results found for this or any previous visit (from the past 720 hour(s)).  Medical History: Past Medical History  Diagnosis Date  . Arthritis   . GERD (gastroesophageal reflux disease)   . Hypertension   . Depression     Medications:  See med rec  Assessment: 79 y/o F directly admitted from orthopedic MD offic s/p fall 08/10/12 with R ankle fracture. Plan surgery. Appears to have cellulitis of R foot.  Afebrile. WBC 5.2. Scr 1.04 with estimated CrCl 30  Goal of Therapy:  Vancomycin trough level 10-15 mcg/ml  Plan:  Zosyn 3.375g IV q8 hrs Vancomycin 1g IV q48h. Check trough after 3-5 doses at steady state.  Misty Stanley Stillinger 08/18/2012,7:09 PM

## 2012-08-18 NOTE — H&P (Signed)
PCP:   Astrid Divine, MD   Chief Complaint:  Swelling, redness and pain, right foot.   HPI: This is an 79 year old female, direct admission to [facility] service, per request of Dr Thurston Hole, orthopedic surgeon, [facility]. It appears that patient sustained mechanical fall on 08/10/12, resulting in minimally displaced right ankle fracture. Conservative management was initially utilized, with immobilization and non-weight bearing. Patient has utilized a walker, since, but fracture has now become more displaced, and surgical treatment is planned. Patient was seen in Dr Sherene Sires office on 08/18/12, and found to have what appears to be a cellulitis of her right foot. She has a known history of OA, osteoporosis, GERD, esophageal strictures, slp multiple endoscopic dilatations, last one on 07/15/12, HTN, depression.   Allergies:   Allergies  Allergen Reactions  . Codeine Swelling      Past Medical History  Diagnosis Date  . Arthritis   . GERD (gastroesophageal reflux disease)   . Hypertension   . Depression     Past Surgical History  Procedure Laterality Date  . Tonsillectomy    . Esophagogastroduodenoscopy      with dil, multiple times  . Esophagogastroduodenoscopy  06/26/2011    Procedure: ESOPHAGOGASTRODUODENOSCOPY (EGD);  Surgeon: [name], MD;  Location: [facility];  Service: Endoscopy;  Laterality: N/A;  with c-arm  . Savory dilation  06/26/2011    Procedure: SAVORY DILATION;  Surgeon: [name], MD;  Location: [facility];  Service: Endoscopy;  Laterality: N/A;  . Esophagogastroduodenoscopy  10/22/2011    Procedure: ESOPHAGOGASTRODUODENOSCOPY (EGD);  Surgeon: Hilarie Fredrickson, MD;  Location: [facility];  Service: Endoscopy;  Laterality: N/A;  . Savory dilation  10/22/2011    Procedure: SAVORY DILATION;  Surgeon: Hilarie Fredrickson, MD;  Location: [facility];  Service: Endoscopy;  Laterality: N/A;  . Esophagogastroduodenoscopy  03/04/2012    Procedure:  ESOPHAGOGASTRODUODENOSCOPY (EGD);  Surgeon: Hilarie Fredrickson, MD;  Location: [facility];  Service: Endoscopy;  Laterality: N/A;  . Savory dilation  03/04/2012    Procedure: SAVORY DILATION;  Surgeon: Hilarie Fredrickson, MD;  Location: [facility];  Service: Endoscopy;  Laterality: N/A;  . Esophagogastroduodenoscopy N/A 07/15/2012    Procedure: ESOPHAGOGASTRODUODENOSCOPY (EGD);  Surgeon: Hilarie Fredrickson, MD;  Location: [facility];  Service: Endoscopy;  Laterality: N/A;  . Savory dilation N/A 07/15/2012    Procedure: SAVORY DILATION;  Surgeon: Hilarie Fredrickson, MD;  Location: [facility];  Service: Endoscopy;  Laterality: N/A;    Prior to Admission medications   Medication Sig Start Date End Date Taking? Authorizing Provider  esomeprazole (NEXIUM) 40 MG capsule Take 40 mg by mouth daily before breakfast.    Historical Provider, MD  mirtazapine (REMERON) 30 MG tablet Take 30 mg by mouth at bedtime.    Historical Provider, MD  omeprazole (PRILOSEC) 20 MG capsule Take 20 mg by mouth daily.    Historical Provider, MD  raloxifene (EVISTA) 60 MG tablet Take 60 mg by mouth daily.    Historical Provider, MD  valsartan (DIOVAN) 160 MG tablet Take 160 mg by mouth daily.    Historical Provider, MD    Social History: Patient reports that she has never smoked. She does not have any smokeless tobacco history on file. She reports that she does not drink alcohol or use illicit drugs.  Family History:  Patient does not recollect.   Review of Systems:  As per HPI and chief complaint. Patent denies fatigue, diminished appetite, weight loss, fever, chills, headache, blurred vision, difficulty in  speaking, dysphagia, chest pain, cough, shortness of breath, nausea, diaphoresis, abdominal pain, vomiting, diarrhea, belching, heartburn, hematemesis, melena, dysuria, nocturia, urinary frequency, hematochezia. The rest of the systems review is negative.  Physical Exam:  General:  Patient does not appear to be in obvious acute  distress. Alert, communicative, fully oriented, talking in complete sentences, not short of breath at rest. Somewhat forgetful.  HEENT:  Mild clinical pallor, no jaundice, no conjunctival injection or discharge. Hydration status is fair.  NECK:  Supple, JVP not seen, no carotid bruits, no palpable lymphadenopathy, no palpable goiter. CHEST:  Clinically clear to auscultation, no wheezes, no crackles. HEART:  Sounds 1 and 2 heard, normal, regular, no murmurs. ABDOMEN:  Full, soft, non-tender, no palpable organomegaly, no palpable masses, normal bowel sounds. GENITALIA:  Not examined. LOWER EXTREMITIES:  No pitting edema, palpable peripheral pulses. MUSCULOSKELETAL SYSTEM:  Generalized osteoarthritic changes. Right lower leg under ace-bandaging, which when removed, revealed bruising around right ankle and foot, associated with swelling and an area of redness on the dorsum.   CENTRAL NERVOUS SYSTEM:  No focal neurologic deficit on gross examination.  Labs on Admission:  No results found for this or any previous visit (from the past 48 hour(s)).  Radiological Exams on Admission: No results found.  Assessment/Plan Active Problems:   1. Ankle fracture, right: Patient sustained a right ankle fracture on 08/10/12, and X-ray at that time, revealed distal fibular and medial malleolar fractures, with minimal displacement. It appears that patient has failed conservative management, and surgical intervention is planned. Dr Winn Jock al, will provide orthopedic consultation. Patient has no history of CAD, chest pain, SOB, and appear to be fairly active. 12-Lead EKG revealed rate-controlled atrial fibrillation (HR =79), then when repeated, showed SR, with PVCs. Will place on telemetric monitoring, and check lytes and TSH, but invasive cardiac testing, prior to surgery, does not appear indicated.  2. Cellulitis of right foot: Patient certainly has swelling, tenderness and evolutionary changes of rather extensive  bruising of right ankle and foot, although there is a patch of redness on the dorsum. Findings could represent traumatic changes, but superimposed cellulitis could be present. Patient is afebrile, and WCC is pending. Will send off blood cultures, and commence broad spectrum antibiotic coverage, with Vancomycin and Zosyn.  3. HTN (hypertension): BP is uncontrolled at 160/67. We shall continue pre-admission anti hypertensives, observe, and adjust medication as indicated.  4. GERD (gastroesophageal reflux disease): Patient has a long-standing history of GERD, complicated by peptic strictures, for which she has had multiple endoscopic dilatation, by Dr [name]. Appears clinically stable from this view point. Will continue BID PPI.  5. Osteoporosis, unspecified: This is the obvious predisposition for patient's fracture. Will continue bisphosphonate therapy, as well as calcium/vitamin D supplements.  6. Depression: Stable. Continued on pre-admission psychotropics.  Further management will depend on clinical course.   Comment: Have discussed with patient's daughter Charlean Sanfilippo, and her son-in-law Al (tel: [phone]), and they have confirmed that patient is FULL CODE.     Time Spent on Admission: 45 mins.   [name],CHRISTOPHER 08/18/2012, 6:12 PM

## 2012-08-19 DIAGNOSIS — L03119 Cellulitis of unspecified part of limb: Secondary | ICD-10-CM

## 2012-08-19 DIAGNOSIS — K219 Gastro-esophageal reflux disease without esophagitis: Secondary | ICD-10-CM

## 2012-08-19 DIAGNOSIS — L02619 Cutaneous abscess of unspecified foot: Secondary | ICD-10-CM

## 2012-08-19 DIAGNOSIS — S8290XS Unspecified fracture of unspecified lower leg, sequela: Secondary | ICD-10-CM

## 2012-08-19 LAB — URINALYSIS, ROUTINE W REFLEX MICROSCOPIC
Bilirubin Urine: NEGATIVE
Hgb urine dipstick: NEGATIVE
Protein, ur: NEGATIVE mg/dL
Urobilinogen, UA: 0.2 mg/dL (ref 0.0–1.0)

## 2012-08-19 LAB — CBC
MCHC: 36.3 g/dL — ABNORMAL HIGH (ref 30.0–36.0)
RDW: 12.5 % (ref 11.5–15.5)

## 2012-08-19 LAB — COMPREHENSIVE METABOLIC PANEL
ALT: 7 U/L (ref 0–35)
Alkaline Phosphatase: 45 U/L (ref 39–117)
CO2: 27 mEq/L (ref 19–32)
Chloride: 103 mEq/L (ref 96–112)
GFR calc Af Amer: 55 mL/min — ABNORMAL LOW (ref >90)
GFR calc non Af Amer: 47 mL/min — ABNORMAL LOW (ref >90)
Glucose, Bld: 110 mg/dL — ABNORMAL HIGH (ref 70–99)
Potassium: 4 mEq/L (ref 3.5–5.1)
Sodium: 136 mEq/L (ref 135–145)
Total Bilirubin: 0.6 mg/dL (ref 0.3–1.2)

## 2012-08-19 LAB — PROTIME-INR
INR: 1.05 (ref 0.00–1.49)
Prothrombin Time: 13.6 seconds (ref 11.6–15.2)

## 2012-08-19 MED ORDER — METOPROLOL TARTRATE 12.5 MG HALF TABLET
12.5000 mg | ORAL_TABLET | Freq: Two times a day (BID) | ORAL | Status: DC
  Administered 2012-08-19 – 2012-08-25 (×13): 12.5 mg via ORAL
  Filled 2012-08-19 (×15): qty 1

## 2012-08-19 MED ORDER — SENNOSIDES-DOCUSATE SODIUM 8.6-50 MG PO TABS
2.0000 | ORAL_TABLET | Freq: Every evening | ORAL | Status: DC | PRN
  Filled 2012-08-19: qty 2

## 2012-08-19 NOTE — Progress Notes (Signed)
[facility] PROGRESS NOTE  [name] HYQ:[id number] DOB: 1922/12/20 DOA: 08/18/2012 PCP: Astrid Divine, MD  Assessment/Plan  Ankle fracture, right:  Patient sustained a right ankle fracture on 08/10/12 but failed conservative management with increasing displacement of the fracture -  To OR Wed or Thursday   2. Cellulitis of right foot:  -  BCx NGTD -  Continue vancomycin and zosyn -  May transition to bactrim DS 2 tab BID plus keflex at time of discharge  3. HTN (hypertension):  BP still elevated although pain is reportedly controlled. -  Add low dose BB -  Continue arb  PAC and atrial ectopy:   -  Continue telemetry -  Add beta blocker  4. GERD (gastroesophageal reflux disease) with hx of stricture, asymptomatic -  Continue BID PPI  5. Osteoporosis, unspecified:   -  Continue bisphosphonate and calcium/vitamin D supplements.   6. Depression: Stable. Continued psychotropics.  Resting in bed:  At risk for atelectasis and constipation -  Add senna/colace PRN -  Add incentive spirometry  Diet:  regular Access:  PIV IVF:  KVO Proph:  heparin  Code Status: full Family Communication: spoke with patient alone Disposition Plan: to OR Wed or Thur   Consultants:  orthopedics  Procedures:  None  Antibiotics:  Vancomycin 4/28 >>  Zosyn 4/28 >>   HPI/Subjective:  Denies fevers, chills, headache.  Denies chest pain and shortness of breath.  Has some phlegm in the back of her throat.  Denies nausea, vomiting, diarrhea, constipation.  Has been eating well.  Denies pain.    Objective: Filed Vitals:   08/18/12 1700 08/18/12 1747 08/18/12 2221 08/19/12 0611  BP:  160/67 169/73 156/67  Pulse:  77 98 88  Temp:  98.9 F (37.2 C) 98.3 F (36.8 C) 98.2 F (36.8 C)  TempSrc:   Oral Oral  Resp:  16 16 16   Height: 5\' 3"  (1.6 m)     Weight: 51.256 kg (113 lb)     SpO2:  96% 97% 99%    Intake/Output Summary (Last 24 hours) at 08/19/12 0910 Last  data filed at 08/19/12 0538  Gross per 24 hour  Intake    200 ml  Output    400 ml  Net   -200 ml   Filed Weights   08/18/12 1700  Weight: 51.256 kg (113 lb)    Exam:   General:  CF  No acute distress  HEENT:  NCAT, MMM  Cardiovascular:  RRR, nl S1, S2 no mrg, 2+ pulses, warm extremities  Respiratory:  CTAB, no increased WOB  Abdomen:   NABS, soft, NT/ND  MSK:   Normal tone and bulk, no LEE.  Right leg splinted.  Toes with bruising, < 2 sec CR, able to wiggle toes and SITLT  Neuro:  Grossly intact  Data Reviewed: Basic Metabolic Panel:  Recent Labs Lab 08/18/12 1803 08/18/12 2100 08/19/12 0543  NA 136  --  136  K 4.1  --  4.0  CL 101  --  103  CO2 26  --  27  GLUCOSE 104*  --  110*  BUN 13  --  11  CREATININE 1.04 1.03 1.03  CALCIUM 8.9  --  8.6   Liver Function Tests:  Recent Labs Lab 08/18/12 1803 08/19/12 0543  AST 15 13  ALT 9 7  ALKPHOS 53 45  BILITOT 0.3 0.6  PROT 6.3 5.6*  ALBUMIN 3.2* 2.8*   No results found for this basename: LIPASE, AMYLASE,  in the last 168 hours No results found for this basename: AMMONIA,  in the last 168 hours CBC:  Recent Labs Lab 08/18/12 1803 08/18/12 2100 08/19/12 0543  WBC 5.2 5.6 4.4  NEUTROABS 3.5  --   --   HGB 10.5* 10.6* 9.8*  HCT 29.2* 29.6* 27.0*  MCV 91.8 91.9 91.5  PLT 229 234 204   Cardiac Enzymes: No results found for this basename: CKTOTAL, CKMB, CKMBINDEX, TROPONINI,  in the last 168 hours BNP (last 3 results) No results found for this basename: PROBNP,  in the last 8760 hours CBG: No results found for this basename: GLUCAP,  in the last 168 hours  No results found for this or any previous visit (from the past 240 hour(s)).   Studies: Dg Chest 2 View  08/18/2012  *RADIOLOGY REPORT*  Clinical Data: Preoperative chest x-ray.  Ankle fracture.  CHEST - 2 VIEW  Comparison: None.  Findings: Emphysema is present.  Moderate hiatal hernia.  Bilateral pleural apical scarring/thickening.  The  cardiopericardial silhouette appears within normal limits.  Aortic arch atherosclerosis.  No airspace disease.  No pleural effusion. Dextroconvex thoracic scoliosis.  Tortuous thoracic aorta noted on the lateral view.  IMPRESSION: Emphysema without acute cardiopulmonary disease.  Moderate hiatal hernia.   Original Report Authenticated By: Andreas Newport, M.D.     Scheduled Meds: . calcium-vitamin D  1 tablet Oral BID  . heparin  5,000 Units Subcutaneous Q8H  . irbesartan  150 mg Oral Daily  . metoprolol tartrate  12.5 mg Oral BID  . mirtazapine  30 mg Oral QHS  . pantoprazole  40 mg Oral BID  . piperacillin-tazobactam (ZOSYN)  IV  3.375 g Intravenous Q8H  . raloxifene  60 mg Oral Daily  . sodium chloride  3 mL Intravenous Q12H  . sodium chloride  3 mL Intravenous Q12H  . vancomycin  1,000 mg Intravenous Q48H   Continuous Infusions: . lactated ringers      Active Problems:   Ankle fracture, right   Cellulitis of right foot   HTN (hypertension)   GERD (gastroesophageal reflux disease)   Osteoporosis, unspecified   Depression    Time spent: 30 min    Georgann Bramble, [facility]  [facility] Pager [phone]. If 7PM-7AM, please contact night-coverage at [email or web address], password [facility] 08/19/2012, 9:10 AM  LOS: 1 day

## 2012-08-19 NOTE — Progress Notes (Signed)
Utilization review completed. Lyzette Reinhardt, RN, BSN. 

## 2012-08-19 NOTE — Consult Note (Signed)
Reason for Consult:right fibula fracture Referring Physician: Sherril Cong is an 79 y.o. female.  HPI: Patient fell a week and a half ago injurying her right ankle.  She had a minimally displaced fibula fracture last week.  Due to her age and limited mobility we elected to treat conservatively.  Yesterday, she was seen in our office and noted to have new onset cellulitis as well as further displacement of the fibula fracture.  It is medically necessary to operate on the fracture now that it has displaced.  We are unable to operate in the setting of new cellulitis.  She was admitted by the hospitalist, Dr Brien Few, to treat her cellulitis prior to surgery.  Past Medical History  Diagnosis Date  . Arthritis   . GERD (gastroesophageal reflux disease)   . Hypertension   . Depression     Past Surgical History  Procedure Laterality Date  . Tonsillectomy    . Esophagogastroduodenoscopy      with dil, multiple times  . Esophagogastroduodenoscopy  06/26/2011    Procedure: ESOPHAGOGASTRODUODENOSCOPY (EGD);  Surgeon: [name], MD;  Location: [facility];  Service: Endoscopy;  Laterality: N/A;  with c-arm  . Savory dilation  06/26/2011    Procedure: SAVORY DILATION;  Surgeon: [name], MD;  Location: [facility];  Service: Endoscopy;  Laterality: N/A;  . Esophagogastroduodenoscopy  10/22/2011    Procedure: ESOPHAGOGASTRODUODENOSCOPY (EGD);  Surgeon: Hilarie Fredrickson, MD;  Location: [facility];  Service: Endoscopy;  Laterality: N/A;  . Savory dilation  10/22/2011    Procedure: SAVORY DILATION;  Surgeon: Hilarie Fredrickson, MD;  Location: [facility];  Service: Endoscopy;  Laterality: N/A;  . Esophagogastroduodenoscopy  03/04/2012    Procedure: ESOPHAGOGASTRODUODENOSCOPY (EGD);  Surgeon: Hilarie Fredrickson, MD;  Location: [facility];  Service: Endoscopy;  Laterality: N/A;  . Savory dilation  03/04/2012    Procedure: SAVORY DILATION;  Surgeon: Hilarie Fredrickson, MD;  Location: [facility];  Service: Endoscopy;   Laterality: N/A;  . Esophagogastroduodenoscopy N/A 07/15/2012    Procedure: ESOPHAGOGASTRODUODENOSCOPY (EGD);  Surgeon: Hilarie Fredrickson, MD;  Location: [facility];  Service: Endoscopy;  Laterality: N/A;  . Savory dilation N/A 07/15/2012    Procedure: SAVORY DILATION;  Surgeon: Hilarie Fredrickson, MD;  Location: [facility];  Service: Endoscopy;  Laterality: N/A;    No family history on file.  Social History:  reports that she has never smoked. She does not have any smokeless tobacco history on file. She reports that she does not drink alcohol or use illicit drugs.  Allergies:  Allergies  Allergen Reactions  . Codeine Swelling    Medications:  Prior to Admission:  Prescriptions prior to admission  Medication Sig Dispense Refill  . esomeprazole (NEXIUM) 40 MG capsule Take 40 mg by mouth daily before breakfast.      . mirtazapine (REMERON) 30 MG tablet Take 30 mg by mouth at bedtime.      Marland Kitchen omeprazole (PRILOSEC) 20 MG capsule Take 20 mg by mouth daily.      . raloxifene (EVISTA) 60 MG tablet Take 60 mg by mouth daily.      . valsartan (DIOVAN) 160 MG tablet Take 160 mg by mouth daily.        Results for orders placed during the hospital encounter of 08/18/12 (from the past 48 hour(s))  TYPE AND SCREEN     Status: None   Collection Time    08/18/12  5:59 PM      Result Value Range  ABO/RH(D) A POS     Antibody Screen NEG     Sample Expiration 08/21/2012    ABO/RH     Status: None   Collection Time    08/18/12  5:59 PM      Result Value Range   ABO/RH(D) A POS    APTT     Status: None   Collection Time    08/18/12  6:03 PM      Result Value Range   aPTT 32  24 - 37 seconds  CBC WITH DIFFERENTIAL     Status: Abnormal   Collection Time    08/18/12  6:03 PM      Result Value Range   WBC 5.2  4.0 - 10.5 K/uL   RBC 3.18 (*) 3.87 - 5.11 MIL/uL   Hemoglobin 10.5 (*) 12.0 - 15.0 g/dL   HCT 09.8 (*) 11.9 - 14.7 %   MCV 91.8  78.0 - 100.0 fL   MCH 33.0  26.0 - 34.0 pg   MCHC 36.0   30.0 - 36.0 g/dL   RDW 82.9  56.2 - 13.0 %   Platelets 229  150 - 400 K/uL   Neutrophils Relative 67  43 - 77 %   Neutro Abs 3.5  1.7 - 7.7 K/uL   Lymphocytes Relative 23  12 - 46 %   Lymphs Abs 1.2  0.7 - 4.0 K/uL   Monocytes Relative 9  3 - 12 %   Monocytes Absolute 0.5  0.1 - 1.0 K/uL   Eosinophils Relative 1  0 - 5 %   Eosinophils Absolute 0.0  0.0 - 0.7 K/uL   Basophils Relative 1  0 - 1 %   Basophils Absolute 0.0  0.0 - 0.1 K/uL  COMPREHENSIVE METABOLIC PANEL     Status: Abnormal   Collection Time    08/18/12  6:03 PM      Result Value Range   Sodium 136  135 - 145 mEq/L   Potassium 4.1  3.5 - 5.1 mEq/L   Chloride 101  96 - 112 mEq/L   CO2 26  19 - 32 mEq/L   Glucose, Bld 104 (*) 70 - 99 mg/dL   BUN 13  6 - 23 mg/dL   Creatinine, Ser 8.65  0.50 - 1.10 mg/dL   Calcium 8.9  8.4 - 78.4 mg/dL   Total Protein 6.3  6.0 - 8.3 g/dL   Albumin 3.2 (*) 3.5 - 5.2 g/dL   AST 15  0 - 37 U/L   ALT 9  0 - 35 U/L   Alkaline Phosphatase 53  39 - 117 U/L   Total Bilirubin 0.3  0.3 - 1.2 mg/dL   GFR calc non Af Amer 47 (*) >90 mL/min   GFR calc Af Amer 54 (*) >90 mL/min   Comment:            The eGFR has been calculated     using the CKD EPI equation.     This calculation has not been     validated in all clinical     situations.     eGFR's persistently     <90 mL/min signify     possible Chronic Kidney Disease.  PROTIME-INR     Status: None   Collection Time    08/18/12  6:03 PM      Result Value Range   Prothrombin Time 12.9  11.6 - 15.2 seconds   INR 0.98  0.00 - 1.49  TSH  Status: None   Collection Time    08/18/12  9:00 PM      Result Value Range   TSH 2.588  0.350 - 4.500 uIU/mL  CBC     Status: Abnormal   Collection Time    08/18/12  9:00 PM      Result Value Range   WBC 5.6  4.0 - 10.5 K/uL   RBC 3.22 (*) 3.87 - 5.11 MIL/uL   Hemoglobin 10.6 (*) 12.0 - 15.0 g/dL   HCT 11.9 (*) 14.7 - 82.9 %   MCV 91.9  78.0 - 100.0 fL   MCH 32.9  26.0 - 34.0 pg   MCHC 35.8   30.0 - 36.0 g/dL   RDW 56.2  13.0 - 86.5 %   Platelets 234  150 - 400 K/uL  CREATININE, SERUM     Status: Abnormal   Collection Time    08/18/12  9:00 PM      Result Value Range   Creatinine, Ser 1.03  0.50 - 1.10 mg/dL   GFR calc non Af Amer 47 (*) >90 mL/min   GFR calc Af Amer 55 (*) >90 mL/min   Comment:            The eGFR has been calculated     using the CKD EPI equation.     This calculation has not been     validated in all clinical     situations.     eGFR's persistently     <90 mL/min signify     possible Chronic Kidney Disease.  URINALYSIS, ROUTINE W REFLEX MICROSCOPIC     Status: None   Collection Time    08/19/12  3:11 AM      Result Value Range   Color, Urine YELLOW  YELLOW   APPearance CLEAR  CLEAR   Specific Gravity, Urine 1.016  1.005 - 1.030   pH 5.0  5.0 - 8.0   Glucose, UA NEGATIVE  NEGATIVE mg/dL   Hgb urine dipstick NEGATIVE  NEGATIVE   Bilirubin Urine NEGATIVE  NEGATIVE   Ketones, ur NEGATIVE  NEGATIVE mg/dL   Protein, ur NEGATIVE  NEGATIVE mg/dL   Urobilinogen, UA 0.2  0.0 - 1.0 mg/dL   Nitrite NEGATIVE  NEGATIVE   Leukocytes, UA NEGATIVE  NEGATIVE   Comment: MICROSCOPIC NOT DONE ON URINES WITH NEGATIVE PROTEIN, BLOOD, LEUKOCYTES, NITRITE, OR GLUCOSE <1000 mg/dL.  COMPREHENSIVE METABOLIC PANEL     Status: Abnormal   Collection Time    08/19/12  5:43 AM      Result Value Range   Sodium 136  135 - 145 mEq/L   Potassium 4.0  3.5 - 5.1 mEq/L   Chloride 103  96 - 112 mEq/L   CO2 27  19 - 32 mEq/L   Glucose, Bld 110 (*) 70 - 99 mg/dL   BUN 11  6 - 23 mg/dL   Creatinine, Ser 7.84  0.50 - 1.10 mg/dL   Calcium 8.6  8.4 - 69.6 mg/dL   Total Protein 5.6 (*) 6.0 - 8.3 g/dL   Albumin 2.8 (*) 3.5 - 5.2 g/dL   AST 13  0 - 37 U/L   ALT 7  0 - 35 U/L   Alkaline Phosphatase 45  39 - 117 U/L   Total Bilirubin 0.6  0.3 - 1.2 mg/dL   GFR calc non Af Amer 47 (*) >90 mL/min   GFR calc Af Amer 55 (*) >90 mL/min   Comment:  The eGFR has been  calculated     using the CKD EPI equation.     This calculation has not been     validated in all clinical     situations.     eGFR's persistently     <90 mL/min signify     possible Chronic Kidney Disease.  CBC     Status: Abnormal   Collection Time    08/19/12  5:43 AM      Result Value Range   WBC 4.4  4.0 - 10.5 K/uL   RBC 2.95 (*) 3.87 - 5.11 MIL/uL   Hemoglobin 9.8 (*) 12.0 - 15.0 g/dL   HCT 16.1 (*) 09.6 - 04.5 %   MCV 91.5  78.0 - 100.0 fL   MCH 33.2  26.0 - 34.0 pg   MCHC 36.3 (*) 30.0 - 36.0 g/dL   RDW 40.9  81.1 - 91.4 %   Platelets 204  150 - 400 K/uL  PROTIME-INR     Status: None   Collection Time    08/19/12  5:43 AM      Result Value Range   Prothrombin Time 13.6  11.6 - 15.2 seconds   INR 1.05  0.00 - 1.49  APTT     Status: None   Collection Time    08/19/12  5:43 AM      Result Value Range   aPTT 34  24 - 37 seconds    Dg Chest 2 View  08/18/2012  *RADIOLOGY REPORT*  Clinical Data: Preoperative chest x-ray.  Ankle fracture.  CHEST - 2 VIEW  Comparison: None.  Findings: Emphysema is present.  Moderate hiatal hernia.  Bilateral pleural apical scarring/thickening.  The cardiopericardial silhouette appears within normal limits.  Aortic arch atherosclerosis.  No airspace disease.  No pleural effusion. Dextroconvex thoracic scoliosis.  Tortuous thoracic aorta noted on the lateral view.  IMPRESSION: Emphysema without acute cardiopulmonary disease.  Moderate hiatal hernia.   Original Report Authenticated By: Andreas Newport, M.D.     Review of Systems  Constitutional: Negative for fever, chills, weight loss, malaise/fatigue and diaphoresis.  HENT: Negative.   Eyes: Negative.   Respiratory: Negative.   Cardiovascular: Negative.   Gastrointestinal: Negative.   Genitourinary: Negative.   Musculoskeletal: Positive for joint pain.       Right ankle  Skin:       Redness and swelling with ecchymosis right foot and ankle  Neurological: Positive for weakness.   Endo/Heme/Allergies: Negative.   Psychiatric/Behavioral: Negative.    Blood pressure 156/67, pulse 88, temperature 98.2 F (36.8 C), temperature source Oral, resp. rate 16, height 5\' 3"  (1.6 m), weight 51.256 kg (113 lb), SpO2 99.00%. Physical Exam  Constitutional: She is oriented to person, place, and time. She appears well-developed and well-nourished.  HENT:  Head: Normocephalic and atraumatic.  Eyes: EOM are normal. Pupils are equal, round, and reactive to light.  Neck: Neck supple.  Cardiovascular: Normal rate.   Respiratory: Effort normal.  GI: Soft.  Genitourinary:  Not pertinent to current symptomatology therefore not examined.  Musculoskeletal:  Right foot and ankle swollen with ecchymosis due to ankle fracture one week ago.  After removing the splint in the office yesterday, patient had marked redness on the distal ankle and foot.  Warm to touch.  Painful  Neurological: She is alert and oriented to person, place, and time.  Skin: Skin is warm. There is erythema.  Psychiatric: She has a normal mood and affect.    Assessment/ Active Problems:  Ankle fracture, right   Cellulitis of right foot   HTN (hypertension)   GERD (gastroesophageal reflux disease)   Osteoporosis, unspecified   Depression   Plan: Continue IV antibiotics for cellulitis.  Plan surgery for Wednesday or Thursday depending on symptoms.  Non weight bearing until then.  Social work consult for SNF placement post op  Pascal Lux 08/19/2012, 8:33 AM

## 2012-08-20 ENCOUNTER — Encounter (HOSPITAL_COMMUNITY): Payer: Self-pay | Admitting: General Practice

## 2012-08-20 LAB — URINE CULTURE: Culture: NO GROWTH

## 2012-08-20 MED ORDER — SACCHAROMYCES BOULARDII 250 MG PO CAPS
250.0000 mg | ORAL_CAPSULE | Freq: Two times a day (BID) | ORAL | Status: DC
  Administered 2012-08-20 – 2012-08-25 (×11): 250 mg via ORAL
  Filled 2012-08-20 (×12): qty 1

## 2012-08-20 MED ORDER — CEFAZOLIN SODIUM 1-5 GM-% IV SOLN
1.0000 g | INTRAVENOUS | Status: AC
  Administered 2012-08-21: 1 g via INTRAVENOUS
  Filled 2012-08-20: qty 50

## 2012-08-20 NOTE — Progress Notes (Signed)
[facility] PROGRESS NOTE  [name] WUJ:[id number] DOB: April 11, 1923 DOA: 08/18/2012 PCP: Astrid Divine, MD  Assessment/Plan: 1-Ankle fracture, right: Patient sustained a right ankle fracture on 08/10/12 but failed conservative management with increasing displacement of the fracture  - To OR 5-1 2. Cellulitis of right foot:  - BCx NGTD  - Continue vancomycin and zosyn day 3. - May transition to bactrim DS 2 tab BID plus keflex at time of discharge  3. HTN (hypertension): - Continue arb and metoprolol. PAC and atrial ectopy:  - Continue telemetry  -Continue with metoprolol.  4. GERD (gastroesophageal reflux disease) with hx of stricture, asymptomatic  - Continue BID PPI  5. Osteoporosis, unspecified:  - Continue bisphosphonate and calcium/vitamin D supplements.  6. Depression: Stable. Continued psychotropics.   Code Status: Full Family Communication: none at bedside.  Disposition Plan: surgery 5-1   Consultants:  Dr Thurston Hole.  Procedures:  none  Antibiotics: Vancomycin 4/28 >>  Zosyn 4/28 >>    HPI/Subjective: Feeling well, denies worsening right leg pain. Awaiting for the surgery tomorrow.   Objective: Filed Vitals:   08/19/12 1302 08/19/12 2028 08/20/12 0627 08/20/12 1037  BP: 132/51 151/89 131/68   Pulse: 75 70 82 85  Temp: 98.3 F (36.8 C) 98.2 F (36.8 C) 98.3 F (36.8 C)   TempSrc:  Oral Oral   Resp: 18 18 16    Height:      Weight:      SpO2: 99% 97% 99%     Intake/Output Summary (Last 24 hours) at 08/20/12 1250 Last data filed at 08/20/12 0830  Gross per 24 hour  Intake    360 ml  Output    500 ml  Net   -140 ml   Filed Weights   08/18/12 1700  Weight: 51.256 kg (113 lb)    Exam:   General:  No distress.   Cardiovascular: S 1, S 2 RRR  Respiratory: CTA  Abdomen: BS present, soft, NT  Musculoskeletal: Right foot with dressing.   Data Reviewed: Basic Metabolic Panel:  Recent Labs Lab 08/18/12 1803  08/18/12 2100 08/19/12 0543  NA 136  --  136  K 4.1  --  4.0  CL 101  --  103  CO2 26  --  27  GLUCOSE 104*  --  110*  BUN 13  --  11  CREATININE 1.04 1.03 1.03  CALCIUM 8.9  --  8.6   Liver Function Tests:  Recent Labs Lab 08/18/12 1803 08/19/12 0543  AST 15 13  ALT 9 7  ALKPHOS 53 45  BILITOT 0.3 0.6  PROT 6.3 5.6*  ALBUMIN 3.2* 2.8*   No results found for this basename: LIPASE, AMYLASE,  in the last 168 hours No results found for this basename: AMMONIA,  in the last 168 hours CBC:  Recent Labs Lab 08/18/12 1803 08/18/12 2100 08/19/12 0543  WBC 5.2 5.6 4.4  NEUTROABS 3.5  --   --   HGB 10.5* 10.6* 9.8*  HCT 29.2* 29.6* 27.0*  MCV 91.8 91.9 91.5  PLT 229 234 204   Cardiac Enzymes: No results found for this basename: CKTOTAL, CKMB, CKMBINDEX, TROPONINI,  in the last 168 hours BNP (last 3 results) No results found for this basename: PROBNP,  in the last 8760 hours CBG: No results found for this basename: GLUCAP,  in the last 168 hours  Recent Results (from the past 240 hour(s))  CULTURE, BLOOD (ROUTINE X 2)     Status: None   Collection  Time    08/18/12  8:58 PM      Result Value Range Status   Specimen Description BLOOD LEFT ARM   Final   Special Requests BOTTLES DRAWN AEROBIC AND ANAEROBIC 10CC   Final   Culture  Setup Time 08/19/2012 02:40   Final   Culture     Final   Value:        BLOOD CULTURE RECEIVED NO GROWTH TO DATE CULTURE WILL BE HELD FOR 5 DAYS BEFORE ISSUING A FINAL NEGATIVE REPORT   Report Status PENDING   Incomplete  CULTURE, BLOOD (ROUTINE X 2)     Status: None   Collection Time    08/18/12  8:59 PM      Result Value Range Status   Specimen Description BLOOD LEFT HAND   Final   Special Requests BOTTLES DRAWN AEROBIC AND ANAEROBIC 10CC   Final   Culture  Setup Time 08/19/2012 02:40   Final   Culture     Final   Value:        BLOOD CULTURE RECEIVED NO GROWTH TO DATE CULTURE WILL BE HELD FOR 5 DAYS BEFORE ISSUING A FINAL NEGATIVE REPORT    Report Status PENDING   Incomplete     Studies: Dg Chest 2 View  08/18/2012  *RADIOLOGY REPORT*  Clinical Data: Preoperative chest x-ray.  Ankle fracture.  CHEST - 2 VIEW  Comparison: None.  Findings: Emphysema is present.  Moderate hiatal hernia.  Bilateral pleural apical scarring/thickening.  The cardiopericardial silhouette appears within normal limits.  Aortic arch atherosclerosis.  No airspace disease.  No pleural effusion. Dextroconvex thoracic scoliosis.  Tortuous thoracic aorta noted on the lateral view.  IMPRESSION: Emphysema without acute cardiopulmonary disease.  Moderate hiatal hernia.   Original Report Authenticated By: Andreas Newport, M.D.     Scheduled Meds: . calcium-vitamin D  1 tablet Oral BID  . [START ON 08/21/2012]  ceFAZolin (ANCEF) IV  1 g Intravenous On Call to OR  . heparin  5,000 Units Subcutaneous Q8H  . irbesartan  150 mg Oral Daily  . metoprolol tartrate  12.5 mg Oral BID  . mirtazapine  30 mg Oral QHS  . pantoprazole  40 mg Oral BID  . piperacillin-tazobactam (ZOSYN)  IV  3.375 g Intravenous Q8H  . raloxifene  60 mg Oral Daily  . saccharomyces boulardii  250 mg Oral BID  . sodium chloride  3 mL Intravenous Q12H  . sodium chloride  3 mL Intravenous Q12H  . vancomycin  1,000 mg Intravenous Q48H   Continuous Infusions: . lactated ringers      Active Problems:   Ankle fracture, right   Cellulitis of right foot   HTN (hypertension)   GERD (gastroesophageal reflux disease)   Osteoporosis, unspecified   Depression    Time spent: 25 minutes.     Zacchary Pompei  [facility] Pager [phone]. If 7PM-7AM, please contact night-coverage at [email or web address], password [facility] 08/20/2012, 12:50 PM  LOS: 2 days

## 2012-08-20 NOTE — Progress Notes (Signed)
Subjective: 89 yof with right fibula fracture was admitted for cellulitis on Monday.  Celluilitis is improving with much less redness and some decrease in swelling.  Objective: Vital signs in last 24 hours: Temp:  [98.2 F (36.8 C)-98.3 F (36.8 C)] 98.3 F (36.8 C) (04/30 0627) Pulse Rate:  [70-90] 82 (04/30 0627) Resp:  [16-18] 16 (04/30 0627) BP: (131-151)/(51-89) 131/68 mmHg (04/30 0627) SpO2:  [97 %-99 %] 99 % (04/30 0627)  Intake/Output from previous day: 04/29 0701 - 04/30 0700 In: 720 [P.O.:720] Out: 500 [Urine:500] Intake/Output this shift:     Recent Labs  08/18/12 1803 08/18/12 2100 08/19/12 0543  HGB 10.5* 10.6* 9.8*    Recent Labs  08/18/12 2100 08/19/12 0543  WBC 5.6 4.4  RBC 3.22* 2.95*  HCT 29.6* 27.0*  PLT 234 204    Recent Labs  08/18/12 1803 08/18/12 2100 08/19/12 0543  NA 136  --  136  K 4.1  --  4.0  CL 101  --  103  CO2 26  --  27  BUN 13  --  11  CREATININE 1.04 1.03 1.03  GLUCOSE 104*  --  110*  CALCIUM 8.9  --  8.6    Recent Labs  08/18/12 1803 08/19/12 0543  INR 0.98 1.05    ABD soft Neurovascular intact Sensation intact distally Intact pulses distally less redness today however still moderate edema  Assessment/ Active Problems:   Ankle fracture, right   Cellulitis of right foot   HTN (hypertension)   GERD (gastroesophageal reflux disease)   Osteoporosis, unspecified   Depression     Plan: TO  OR in am at 7:30   NPO after midnight   Please have patient sign consent.  Plan on SNF placement for Friday.  Patients family requests [facility]   Fortino Haag J 08/20/2012, 7:14 AM      

## 2012-08-21 ENCOUNTER — Inpatient Hospital Stay (HOSPITAL_COMMUNITY): Payer: Medicare Other | Admitting: Anesthesiology

## 2012-08-21 ENCOUNTER — Encounter (HOSPITAL_COMMUNITY): Payer: Self-pay | Admitting: Anesthesiology

## 2012-08-21 ENCOUNTER — Encounter (HOSPITAL_COMMUNITY)
Admission: AD | Disposition: A | Discharge status: Skilled Nursing Facility | Payer: Self-pay | Source: Ambulatory Visit | Attending: Internal Medicine

## 2012-08-21 HISTORY — PX: ORIF ANKLE FRACTURE: SHX5408

## 2012-08-21 LAB — CBC
HCT: 26.2 % — ABNORMAL LOW (ref 36.0–46.0)
MCH: 33 pg (ref 26.0–34.0)
MCV: 91.9 fL (ref 78.0–100.0)
Platelets: 207 10*3/uL (ref 150–400)
RDW: 12.6 % (ref 11.5–15.5)

## 2012-08-21 LAB — BASIC METABOLIC PANEL
BUN: 9 mg/dL (ref 6–23)
Chloride: 103 mEq/L (ref 96–112)
GFR calc Af Amer: 41 mL/min — ABNORMAL LOW (ref >90)
GFR calc non Af Amer: 35 mL/min — ABNORMAL LOW (ref >90)
Potassium: 3.5 mEq/L (ref 3.5–5.1)
Sodium: 139 mEq/L (ref 135–145)

## 2012-08-21 LAB — SURGICAL PCR SCREEN: Staphylococcus aureus: NEGATIVE

## 2012-08-21 SURGERY — OPEN REDUCTION INTERNAL FIXATION (ORIF) ANKLE FRACTURE
Anesthesia: General | Site: Ankle | Laterality: Right | Wound class: Clean

## 2012-08-21 MED ORDER — DOCUSATE SODIUM 100 MG PO CAPS
100.0000 mg | ORAL_CAPSULE | Freq: Two times a day (BID) | ORAL | Status: DC
  Administered 2012-08-21 – 2012-08-25 (×8): 100 mg via ORAL
  Filled 2012-08-21 (×8): qty 1

## 2012-08-21 MED ORDER — SUCCINYLCHOLINE CHLORIDE 20 MG/ML IJ SOLN
INTRAMUSCULAR | Status: DC | PRN
  Administered 2012-08-21: 120 mg via INTRAVENOUS

## 2012-08-21 MED ORDER — DEXAMETHASONE SODIUM PHOSPHATE 4 MG/ML IJ SOLN
INTRAMUSCULAR | Status: DC | PRN
  Administered 2012-08-21: 8 mg via INTRAVENOUS

## 2012-08-21 MED ORDER — 0.9 % SODIUM CHLORIDE (POUR BTL) OPTIME
TOPICAL | Status: DC | PRN
  Administered 2012-08-21: 1000 mL

## 2012-08-21 MED ORDER — MENTHOL 3 MG MT LOZG: 1.0000 | LOZENGE | OROMUCOSAL | Status: DC | PRN

## 2012-08-21 MED ORDER — ACETAMINOPHEN 325 MG PO TABS
650.0000 mg | ORAL_TABLET | ORAL | Status: DC
  Administered 2012-08-21 – 2012-08-22 (×5): 650 mg via ORAL
  Filled 2012-08-21 (×6): qty 2

## 2012-08-21 MED ORDER — LIDOCAINE HCL (CARDIAC) 20 MG/ML IV SOLN
INTRAVENOUS | Status: DC | PRN
  Administered 2012-08-21: 40 mg via INTRAVENOUS

## 2012-08-21 MED ORDER — LACTATED RINGERS IV SOLN
INTRAVENOUS | Status: DC | PRN
  Administered 2012-08-21: 07:00:00 via INTRAVENOUS

## 2012-08-21 MED ORDER — LABETALOL HCL 5 MG/ML IV SOLN
INTRAVENOUS | Status: DC | PRN
  Administered 2012-08-21 (×2): 2.5 mg via INTRAVENOUS

## 2012-08-21 MED ORDER — BUPIVACAINE HCL (PF) 0.25 % IJ SOLN
INTRAMUSCULAR | Status: DC | PRN
  Administered 2012-08-21: 30 mL

## 2012-08-21 MED ORDER — METOCLOPRAMIDE HCL 5 MG/ML IJ SOLN: 5.0000 mg | Freq: Three times a day (TID) | INTRAMUSCULAR | Status: DC | PRN

## 2012-08-21 MED ORDER — BISACODYL 5 MG PO TBEC
10.0000 mg | DELAYED_RELEASE_TABLET | Freq: Every day | ORAL | Status: DC
  Administered 2012-08-21: 10 mg via ORAL
  Filled 2012-08-21: qty 2

## 2012-08-21 MED ORDER — ONDANSETRON HCL 4 MG/2ML IJ SOLN: 4.0000 mg | Freq: Four times a day (QID) | INTRAMUSCULAR | Status: DC | PRN

## 2012-08-21 MED ORDER — HYDROCODONE-ACETAMINOPHEN 5-325 MG PO TABS: 1.0000 | ORAL_TABLET | Freq: Four times a day (QID) | ORAL | Status: DC | PRN

## 2012-08-21 MED ORDER — FENTANYL CITRATE 0.05 MG/ML IJ SOLN
25.0000 ug | INTRAMUSCULAR | Status: DC | PRN
  Administered 2012-08-21 (×2): 25 ug via INTRAVENOUS

## 2012-08-21 MED ORDER — PHENOL 1.4 % MT LIQD
1.0000 | OROMUCOSAL | Status: DC | PRN
Start: 2012-08-21 — End: 2012-08-22

## 2012-08-21 MED ORDER — METOCLOPRAMIDE HCL 5 MG/ML IJ SOLN: 10.0000 mg | Freq: Once | INTRAMUSCULAR | Status: DC | PRN

## 2012-08-21 MED ORDER — ASPIRIN EC 325 MG PO TBEC
325.0000 mg | DELAYED_RELEASE_TABLET | Freq: Every day | ORAL | Status: DC
  Filled 2012-08-21: qty 1

## 2012-08-21 MED ORDER — CEFAZOLIN SODIUM-DEXTROSE 2-3 GM-% IV SOLR: 2.0000 g | Freq: Four times a day (QID) | INTRAVENOUS | Status: DC

## 2012-08-21 MED ORDER — ACETAMINOPHEN 650 MG RE SUPP: 650.0000 mg | Freq: Four times a day (QID) | RECTAL | Status: DC | PRN

## 2012-08-21 MED ORDER — SODIUM CHLORIDE 0.9 % IV SOLN
INTRAVENOUS | Status: DC
  Administered 2012-08-21 – 2012-08-22 (×2): via INTRAVENOUS

## 2012-08-21 MED ORDER — FENTANYL CITRATE 0.05 MG/ML IJ SOLN
INTRAMUSCULAR | Status: DC | PRN
  Administered 2012-08-21 (×2): 12.5 ug via INTRAVENOUS
  Administered 2012-08-21: 50 ug via INTRAVENOUS
  Administered 2012-08-21: 25 ug via INTRAVENOUS

## 2012-08-21 MED ORDER — PROPOFOL 10 MG/ML IV BOLUS
INTRAVENOUS | Status: DC | PRN
  Administered 2012-08-21: 110 mg via INTRAVENOUS

## 2012-08-21 MED ORDER — ONDANSETRON HCL 4 MG PO TABS: 4.0000 mg | ORAL_TABLET | Freq: Four times a day (QID) | ORAL | Status: DC | PRN

## 2012-08-21 MED ORDER — ONDANSETRON HCL 4 MG/2ML IJ SOLN
INTRAMUSCULAR | Status: DC | PRN
  Administered 2012-08-21: 4 mg via INTRAVENOUS

## 2012-08-21 MED ORDER — ASPIRIN EC 325 MG PO TBEC
325.0000 mg | DELAYED_RELEASE_TABLET | Freq: Two times a day (BID) | ORAL | Status: DC
  Administered 2012-08-22: 325 mg via ORAL
  Filled 2012-08-21 (×3): qty 1

## 2012-08-21 MED ORDER — MORPHINE SULFATE 2 MG/ML IJ SOLN: 0.5000 mg | INTRAMUSCULAR | Status: DC | PRN

## 2012-08-21 MED ORDER — POTASSIUM CHLORIDE IN NACL 20-0.9 MEQ/L-% IV SOLN
INTRAVENOUS | Status: DC
  Filled 2012-08-21 (×2): qty 1000

## 2012-08-21 MED ORDER — ACETAMINOPHEN 325 MG PO TABS: 650.0000 mg | ORAL_TABLET | Freq: Four times a day (QID) | ORAL | Status: DC | PRN

## 2012-08-21 MED ORDER — METOCLOPRAMIDE HCL 10 MG PO TABS: 5.0000 mg | ORAL_TABLET | Freq: Three times a day (TID) | ORAL | Status: DC | PRN

## 2012-08-21 SURGICAL SUPPLY — 81 items
BANDAGE ELASTIC 4 VELCRO ST LF (GAUZE/BANDAGES/DRESSINGS) ×3 IMPLANT
BANDAGE ELASTIC 6 VELCRO ST LF (GAUZE/BANDAGES/DRESSINGS) ×1 IMPLANT
BANDAGE ESMARK 6X9 LF (GAUZE/BANDAGES/DRESSINGS) ×1 IMPLANT
BANDAGE GAUZE ELAST BULKY 4 IN (GAUZE/BANDAGES/DRESSINGS) ×1 IMPLANT
BIT DRILL 2.5X110 QC LCP DISP (BIT) ×1 IMPLANT
BIT DRILL 2.8 (BIT) ×1
BIT DRILL CANN QC 2.8X165 (BIT) IMPLANT
BIT DRILL QC 3.5X110 (BIT) ×1 IMPLANT
BLADE SURG 10 STRL SS (BLADE) ×2 IMPLANT
BNDG CMPR 9X6 STRL LF SNTH (GAUZE/BANDAGES/DRESSINGS) ×1
BNDG COHESIVE 4X5 TAN STRL (GAUZE/BANDAGES/DRESSINGS) ×1 IMPLANT
BNDG ESMARK 6X9 LF (GAUZE/BANDAGES/DRESSINGS) ×2
CLOTH BEACON ORANGE TIMEOUT ST (SAFETY) ×3 IMPLANT
COVER MAYO STAND STRL (DRAPES) ×2 IMPLANT
COVER SURGICAL LIGHT HANDLE (MISCELLANEOUS) ×2 IMPLANT
CUFF TOURNIQUET SINGLE 18IN (TOURNIQUET CUFF) IMPLANT
CUFF TOURNIQUET SINGLE 24IN (TOURNIQUET CUFF) ×1 IMPLANT
CUFF TOURNIQUET SINGLE 34IN LL (TOURNIQUET CUFF) IMPLANT
CUFF TOURNIQUET SINGLE 44IN (TOURNIQUET CUFF) IMPLANT
DECANTER SPIKE VIAL GLASS SM (MISCELLANEOUS) IMPLANT
DRAPE C-ARM 42X72 X-RAY (DRAPES) IMPLANT
DRAPE OEC MINIVIEW 54X84 (DRAPES) ×1 IMPLANT
DRAPE U-SHAPE 47X51 STRL (DRAPES) ×2 IMPLANT
DRILL BIT 2.8MM (BIT) ×2
DRSG PAD ABDOMINAL 8X10 ST (GAUZE/BANDAGES/DRESSINGS) ×1 IMPLANT
DURAPREP 26ML APPLICATOR (WOUND CARE) ×2 IMPLANT
ELECT REM PT RETURN 9FT ADLT (ELECTROSURGICAL) ×2
ELECTRODE REM PT RTRN 9FT ADLT (ELECTROSURGICAL) ×1 IMPLANT
GAUZE XEROFORM 1X8 LF (GAUZE/BANDAGES/DRESSINGS) ×3 IMPLANT
GAUZE XEROFORM 5X9 LF (GAUZE/BANDAGES/DRESSINGS) ×1 IMPLANT
GLOVE BIO SURGEON STRL SZ7 (GLOVE) ×2 IMPLANT
GLOVE BIOGEL PI IND STRL 7.0 (GLOVE) ×1 IMPLANT
GLOVE BIOGEL PI IND STRL 7.5 (GLOVE) ×1 IMPLANT
GLOVE BIOGEL PI INDICATOR 7.0 (GLOVE) ×1
GLOVE BIOGEL PI INDICATOR 7.5 (GLOVE) ×1
GLOVE SS BIOGEL STRL SZ 7 (GLOVE) IMPLANT
GLOVE SS BIOGEL STRL SZ 7.5 (GLOVE) ×1 IMPLANT
GLOVE SUPERSENSE BIOGEL SZ 7 (GLOVE) ×1
GLOVE SUPERSENSE BIOGEL SZ 7.5 (GLOVE) ×1
GOWN STRL NON-REIN LRG LVL3 (GOWN DISPOSABLE) ×6 IMPLANT
KIT BASIN OR (CUSTOM PROCEDURE TRAY) ×2 IMPLANT
KIT ROOM TURNOVER OR (KITS) ×2 IMPLANT
MANIFOLD NEPTUNE II (INSTRUMENTS) ×2 IMPLANT
NDL HYPO 21X1.5 SAFETY (NEEDLE) IMPLANT
NDL HYPO 25GX1X1/2 BEV (NEEDLE) IMPLANT
NEEDLE HYPO 21X1.5 SAFETY (NEEDLE) IMPLANT
NEEDLE HYPO 25GX1X1/2 BEV (NEEDLE) ×2 IMPLANT
NS IRRIG 1000ML POUR BTL (IV SOLUTION) ×2 IMPLANT
PACK ORTHO EXTREMITY (CUSTOM PROCEDURE TRAY) ×2 IMPLANT
PAD ARMBOARD 7.5X6 YLW CONV (MISCELLANEOUS) ×4 IMPLANT
PAD CAST 4YDX4 CTTN HI CHSV (CAST SUPPLIES) ×2 IMPLANT
PADDING CAST COTTON 4X4 STRL (CAST SUPPLIES) ×4
PADDING CAST COTTON 6X4 STRL (CAST SUPPLIES) ×1 IMPLANT
PLATE LCP 3.5 1/3 TUB 6HX69 (Plate) ×1 IMPLANT
SCREW CANC FT 4.0X20 (Screw) ×2 IMPLANT
SCREW CORTEX 3.5 10MM (Screw) ×1 IMPLANT
SCREW CORTEX 3.5 12MM (Screw) ×2 IMPLANT
SCREW LOCK CORT ST 3.5X10 (Screw) IMPLANT
SCREW LOCK CORT ST 3.5X12 (Screw) IMPLANT
SCREW LOCK T15 FT 18X3.5X2.9X (Screw) IMPLANT
SCREW LOCK T15 FT 20X3.5XST (Screw) IMPLANT
SCREW LOCKING 3.5X18 (Screw) ×2 IMPLANT
SCREW LOCKING 3.5X20 (Screw) ×2 IMPLANT
SPONGE GAUZE 4X4 12PLY (GAUZE/BANDAGES/DRESSINGS) ×2 IMPLANT
SPONGE LAP 4X18 X RAY DECT (DISPOSABLE) ×3 IMPLANT
SPONGE SCRUB IODOPHOR (GAUZE/BANDAGES/DRESSINGS) ×1 IMPLANT
STAPLER VISISTAT 35W (STAPLE) ×2 IMPLANT
STRIP CLOSURE SKIN 1/2X4 (GAUZE/BANDAGES/DRESSINGS) ×1 IMPLANT
SUCTION FRAZIER TIP 10 FR DISP (SUCTIONS) ×2 IMPLANT
SUT MNCRL AB 3-0 PS2 18 (SUTURE) ×1 IMPLANT
SUT VIC AB 0 CT1 27 (SUTURE) ×2
SUT VIC AB 0 CT1 27XBRD ANBCTR (SUTURE) ×1 IMPLANT
SUT VIC AB 1 CT1 27 (SUTURE)
SUT VIC AB 1 CT1 27XBRD ANTBC (SUTURE) ×1 IMPLANT
SUT VIC AB 2-0 FS1 27 (SUTURE) ×2 IMPLANT
SYR CONTROL 10ML LL (SYRINGE) ×1 IMPLANT
TOWEL OR 17X24 6PK STRL BLUE (TOWEL DISPOSABLE) ×2 IMPLANT
TOWEL OR 17X26 10 PK STRL BLUE (TOWEL DISPOSABLE) ×2 IMPLANT
TUBE CONNECTING 12X1/4 (SUCTIONS) ×2 IMPLANT
UNDERPAD 30X30 INCONTINENT (UNDERPADS AND DIAPERS) ×2 IMPLANT
WATER STERILE IRR 1000ML POUR (IV SOLUTION) ×2 IMPLANT

## 2012-08-21 NOTE — Brief Op Note (Signed)
08/18/2012 - 08/21/2012  7:53 AM  PATIENT:  [name]  79 y.o. female  PRE-OPERATIVE DIAGNOSIS:  RIGHT ANKLE FRACTURE  POST-OPERATIVE DIAGNOSIS:  RIGHT ANKLE FRACTURE  PROCEDURE:  Procedure(s): OPEN REDUCTION INTERNAL FIXATION (ORIF) ANKLE FRACTURE (Right)  SURGEON:  Surgeon(s) and Role:    Salvatore Marvel MD    * Nilda Simmer, MD - Primary  PHYSICIAN ASSISTANT: Shepperson PA  ASSISTANTS: Tomasa Rand PA   ANESTHESIA:   general  EBL:     BLOOD ADMINISTERED:none  DRAINS: none   LOCAL MEDICATIONS USED:  NONE  SPECIMEN:  No Specimen  DISPOSITION OF SPECIMEN:  N/A  COUNTS:  YES  TOURNIQUET:  * Missing tourniquet times found for documented tourniquets in log:  [id number] *  DICTATION: .Note written in EPIC  PLAN OF CARE: Admit to inpatient   PATIENT DISPOSITION:  PACU - hemodynamically stable.   Delay start of Pharmacological VTE agent (>24hrs) due to surgical blood loss or risk of bleeding: no

## 2012-08-21 NOTE — Progress Notes (Signed)
Patient ID: [name], female   DOB: [birth year], 79 y.o.   MRN: [id number] 08/22/2011 Patient appears to be fluctuating between NSR and Afib.  Will consult Garnette Scheuermann MD of [facility] who is her cardiologist.  Will get a 12 lead EKG in the PACU and enzymes.  Will start Xarelto 10 mg in am for DVT prophylaxis in the setting of possible afib.  I am using the low dose of xarelto due to bleeding risk.  If Cardiology determines that the risk of bleeding is higher than the benefit of Xarelto then we will use Aspirin 325mg  BID per CHEST guidelines for DVT prophylaxis.  Lennyn Bellanca A. Gwinda Passe Physician Assistant [facility] [phone]  08/21/2012, 8:17 AM

## 2012-08-21 NOTE — Progress Notes (Signed)
EKG DONE AS ORDERED

## 2012-08-21 NOTE — Progress Notes (Signed)
ANTIBIOTIC CONSULT NOTE-PROGRESS NOTE  Pharmacy Consult for Vancomycin ; Zosyn Indication: R-foot Cellulitis  Hospital Problems Active Problems:   Ankle fracture, right   Cellulitis of right foot   HTN (hypertension)   GERD (gastroesophageal reflux disease)   Osteoporosis, unspecified   Depression   Height: 63 in Weight: 54.5 kg  Vitals: BP 150/80  Pulse 72  Temp(Src) 98 F (36.7 C) (Oral)  Resp 16  Ht 5\' 3"  (1.6 m)  Wt 120 lb 3.2 oz (54.522 kg)  BMI 21.3 kg/m2  SpO2 96%  Labs:  Recent Labs  08/18/12 2100 08/19/12 0543 08/21/12 0435  WBC 5.6 4.4 4.4  HGB 10.6* 9.8* 9.4*  PLT 234 204 207  CREATININE 1.03 1.03 1.31*   Estimated Creatinine Clearance: 25 ml/min (by C-G formula based on Cr of 1.31).   Microbiology: Recent Results (from the past 720 hour(s))  CULTURE, BLOOD (ROUTINE X 2)     Status: None   Collection Time    08/18/12  8:58 PM      Result Value Range Status   Specimen Description BLOOD LEFT ARM   Final   Special Requests BOTTLES DRAWN AEROBIC AND ANAEROBIC 10CC   Final   Culture  Setup Time 08/19/2012 02:40   Final   Culture     Final   Value:        BLOOD CULTURE RECEIVED NO GROWTH TO DATE CULTURE WILL BE HELD FOR 5 DAYS BEFORE ISSUING A FINAL NEGATIVE REPORT   Report Status PENDING   Incomplete  CULTURE, BLOOD (ROUTINE X 2)     Status: None   Collection Time    08/18/12  8:59 PM      Result Value Range Status   Specimen Description BLOOD LEFT HAND   Final   Special Requests BOTTLES DRAWN AEROBIC AND ANAEROBIC 10CC   Final   Culture  Setup Time 08/19/2012 02:40   Final   Culture     Final   Value:        BLOOD CULTURE RECEIVED NO GROWTH TO DATE CULTURE WILL BE HELD FOR 5 DAYS BEFORE ISSUING A FINAL NEGATIVE REPORT   Report Status PENDING   Incomplete  URINE CULTURE     Status: None   Collection Time    08/19/12  3:11 AM      Result Value Range Status   Specimen Description URINE, RANDOM   Final   Special Requests none   Final   Culture   Setup Time 08/19/2012 04:19   Final   Colony Count NO GROWTH   Final   Culture NO GROWTH   Final   Report Status 08/20/2012 FINAL   Final  SURGICAL PCR SCREEN     Status: None   Collection Time    08/20/12 11:29 PM      Result Value Range Status   MRSA, PCR NEGATIVE  NEGATIVE Final   Staphylococcus aureus NEGATIVE  NEGATIVE Final    Anti-infectives Anti-infectives   Start     Dose/Rate Route Frequency Ordered Stop   08/21/12 0945  ceFAZolin (ANCEF) IVPB 2 g/50 mL premix  Status:  Discontinued     2 g 100 mL/hr over 30 Minutes Intravenous Every 6 hours 08/21/12 0941 08/21/12 1020   08/21/12 0600  ceFAZolin (ANCEF) IVPB 1 g/50 mL premix     1 g 100 mL/hr over 30 Minutes Intravenous On call to O.R. 08/20/12 0719 08/21/12 0718   08/18/12 2200  piperacillin-tazobactam (ZOSYN) IVPB 3.375 g  3.375 g 12.5 mL/hr over 240 Minutes Intravenous 3 times per day 08/18/12 1924     08/18/12 2000  vancomycin (VANCOCIN) IVPB 1000 mg/200 mL premix  Status:  Discontinued     1,000 mg 200 mL/hr over 60 Minutes Intravenous Every 48 hours 08/18/12 1924 08/21/12 0824     Assessment:  79 y/o female s/p fall 08/10/12 with R-ankle fracture and Cellulitis of foot.  S/P ORIF of R-ankle distal fibula fracture.  Day # 3 Zosyn, Day # 2 Vancomycin.  Scr rising significantly over last 2 days 1.03 > 1.31.  Est CrCl 25 ml/min.  Goal of Therapy:   Vancomycin trough level 10-15 mcg/ml  Plan:   Continue Zosyn as previously ordered.  Restart Vancomycin post-op, but will hold next scheduled dose [08/22/12 @ 2000 pm] and check Vancomycin trough.  Will re-dose Vancomycin as indicated pending Vancomycin trough results.  Laurena Bering, Pharm.D.  08/21/2012 12:35 PM

## 2012-08-21 NOTE — Anesthesia Procedure Notes (Signed)
Procedure Name: Intubation Date/Time: 08/21/2012 7:16 AM Performed by: Armandina Gemma Pre-anesthesia Checklist: Patient identified, Timeout performed, Emergency Drugs available, Suction available and Patient being monitored Patient Re-evaluated:Patient Re-evaluated prior to inductionOxygen Delivery Method: Circle system utilized Preoxygenation: Pre-oxygenation with 100% oxygen Intubation Type: IV induction Ventilation: Mask ventilation without difficulty Laryngoscope Size: Miller and 2 Grade View: Grade I Tube type: Oral Tube size: 8.0 mm Number of attempts: 1 Airway Equipment and Method: Stylet Placement Confirmation: ETT inserted through vocal cords under direct vision,  breath sounds checked- equal and bilateral and positive ETCO2 Secured at: 22 cm Tube secured with: Tape Dental Injury: Teeth and Oropharynx as per pre-operative assessment  Comments: IV induction Fredricks- intubation AM CRNA- teeth and mouth as preop.

## 2012-08-21 NOTE — Anesthesia Postprocedure Evaluation (Signed)
Anesthesia Post Note  Patient: [name]  Procedure(s) Performed: Procedure(s) (LRB): OPEN REDUCTION INTERNAL FIXATION (ORIF) ANKLE FRACTURE (Right)  Anesthesia type: General  Patient location: PACU  Post pain: Pain level controlled  Post assessment: Patient's Cardiovascular Status Stable  Last Vitals:  Filed Vitals:   08/21/12 0830  BP: 172/78  Pulse: 73  Temp:   Resp:     Post vital signs: Reviewed and stable  Level of consciousness: alert  Complications: No apparent anesthesia complications

## 2012-08-21 NOTE — Preoperative (Signed)
Beta Blockers   Reason not to administer Beta Blockers:Not Applicable 

## 2012-08-21 NOTE — Consult Note (Signed)
Admit date: 08/18/2012 Referring Physician: Dr. Thurston Hole Primary Physician Astrid Divine, MD Primary Cardiologist  Dr. Mendel Ryder Reason for Consultation  Atrial ectopy  HPI: 79 year old female whom Dr. Katrinka Blazing saw in 2008 secondary to consultation for labile blood pressure (recommended Diovan which daughter says has helped out significantly),/family concerns over memory changes, who earlier this morning had right ankle fracture operation secondary to failed conservative management, cellulitis of right foot, hypertension, dementia who while on telemetry was noted to have bursts of irregular heart rhythm, somewhat tachycardic at times with P waves demonstrated throughout. There was concerns about the possibility of atrial fibrillation.  I have reviewed the EKGs within the record system and there are short bursts of paroxysmal atrial tachycardia but no clear evidence of atrial fibrillation is noted. During operation today, she was demonstrating similar findings. It was thought that she was possibly fluctuating between normal sinus rhythm and atrial fibrillation bursts.    She is very pleasant, with no complaints, no chest pain, no shortness of breath, no dizziness.     PMH:   Past Medical History  Diagnosis Date  . Arthritis   . GERD (gastroesophageal reflux disease)   . Hypertension   . Depression     PSH:   Past Surgical History  Procedure Laterality Date  . Tonsillectomy    . Esophagogastroduodenoscopy      with dil, multiple times  . Esophagogastroduodenoscopy  06/26/2011    Procedure: ESOPHAGOGASTRODUODENOSCOPY (EGD);  Surgeon: [name], MD;  Location: [facility];  Service: Endoscopy;  Laterality: N/A;  with c-arm  . Savory dilation  06/26/2011    Procedure: SAVORY DILATION;  Surgeon: [name], MD;  Location: [facility];  Service: Endoscopy;  Laterality: N/A;  . Esophagogastroduodenoscopy  10/22/2011    Procedure: ESOPHAGOGASTRODUODENOSCOPY (EGD);  Surgeon: Hilarie Fredrickson, MD;   Location: [facility];  Service: Endoscopy;  Laterality: N/A;  . Savory dilation  10/22/2011    Procedure: SAVORY DILATION;  Surgeon: Hilarie Fredrickson, MD;  Location: [facility];  Service: Endoscopy;  Laterality: N/A;  . Esophagogastroduodenoscopy  03/04/2012    Procedure: ESOPHAGOGASTRODUODENOSCOPY (EGD);  Surgeon: Hilarie Fredrickson, MD;  Location: [facility];  Service: Endoscopy;  Laterality: N/A;  . Savory dilation  03/04/2012    Procedure: SAVORY DILATION;  Surgeon: Hilarie Fredrickson, MD;  Location: [facility];  Service: Endoscopy;  Laterality: N/A;  . Esophagogastroduodenoscopy N/A 07/15/2012    Procedure: ESOPHAGOGASTRODUODENOSCOPY (EGD);  Surgeon: Hilarie Fredrickson, MD;  Location: [facility];  Service: Endoscopy;  Laterality: N/A;  . Savory dilation N/A 07/15/2012    Procedure: SAVORY DILATION;  Surgeon: Hilarie Fredrickson, MD;  Location: [facility];  Service: Endoscopy;  Laterality: N/A;   Allergies:  Codeine Prior to Admit Meds:   Prescriptions prior to admission  Medication Sig Dispense Refill  . esomeprazole (NEXIUM) 40 MG capsule Take 40 mg by mouth daily before breakfast.      . mirtazapine (REMERON) 30 MG tablet Take 30 mg by mouth at bedtime.      Marland Kitchen omeprazole (PRILOSEC) 20 MG capsule Take 20 mg by mouth daily.      . raloxifene (EVISTA) 60 MG tablet Take 60 mg by mouth daily.      . valsartan (DIOVAN) 160 MG tablet Take 160 mg by mouth daily.       Fam HX:   History reviewed. No pertinent family history. Social HX:    History   Social History  . Marital Status: Widowed    Spouse Name:  N/A    Number of Children: N/A  . Years of Education: N/A   Occupational History  . Not on file.   Social History Main Topics  . Smoking status: Never Smoker   . Smokeless tobacco: Never Used  . Alcohol Use: No  . Drug Use: No  . Sexually Active: No   Other Topics Concern  . Not on file   Social History Narrative  . No narrative on file     ROS:  recent cellulitis, memory issues, denies  chest pain, denies shortness of breath, denies recent stroke, no rashes. All 11 ROS were addressed and are negative except what is stated in the HPI  Physical Exam: Blood pressure 150/80, pulse 72, temperature 98 F (36.7 C), temperature source Oral, resp. rate 16, height 5\' 3"  (1.6 m), weight 54.522 kg (120 lb 3.2 oz), SpO2 96.00%.    General:  elderly female, smiling, pleasant, daughter in room,  in no acute distress Head: Eyes PERRLA, No xanthomas.   Normal cephalic and atramatic  Lungs:   Clear bilaterally to auscultation and percussion. Normal respiratory effort. No wheezes, no rales. Heart:   HRRR S1 S2 Pulses are  Diminished bilaterally. occasional ectopy, soft systolic murmur right upper sternal border           No carotid bruit. No JVD.  No abdominal bruits. Abdomen: Bowel sounds are positive, abdomen soft and non-tender without masses. No hepatosplenomegaly. Msk:  Back normal. Normal strength and tone for age. Extremities:  Dressing in place  No clubbing, cyanosis or edema.  DP +1 Neuro: Alert non-focal, MAE x 4 GU: Deferred Rectal: Deferred Psych:  Good affect    Labs:   Lab Results  Component Value Date   WBC 4.4 08/21/2012   HGB 9.4* 08/21/2012   HCT 26.2* 08/21/2012   MCV 91.9 08/21/2012   PLT 207 08/21/2012    Recent Labs Lab 08/19/12 0543 08/21/12 0435  NA 136 139  K 4.0 3.5  CL 103 103  CO2 27 27  BUN 11 9  CREATININE 1.03 1.31*  CALCIUM 8.6 8.5  PROT 5.6*  --   BILITOT 0.6  --   ALKPHOS 45  --   ALT 7  --   AST 13  --   GLUCOSE 110* 89   No results found for this basename: PTT   Lab Results  Component Value Date   INR 1.05 08/19/2012   INR 0.98 08/18/2012   Lab Results  Component Value Date   CKTOTAL 34 01/28/2007   CKMB 1.4 01/28/2007   TROPONINI  Value: 0.01        NO INDICATION OF MYOCARDIAL INJURY. 01/28/2007    EKG:   as described above, several EKGs reviewed Personally viewed.   prior consult with Dr. Katrinka Blazing reviewed from 2008. Telemetry  demonstrates PACs/occasional PVC, EKG with short burst of paroxysmal atrial tachycardia.  ASSESSMENT/PLAN:   79 year old female with arrhythmia, paroxysmal atrial tachycardia, premature atrial contractions, right ankle fixation, cellulitis, memory issues, hypertension.   1. Paroxysmal atrial tachycardia/PACs-I agree with current management of low-dose metoprolol. It would not be unreasonable to increase this dose slightly to 25 mg twice a day to help further suppress arrhythmias. I do not believe that this is atrial fibrillation from the evidence that I am able to review. Hence, she does not require full anticoagulation.  I discussed this with her daughter who is very Adult nurse. Her daughter states that she takes low-dose metoprolol as well and is a patient of  Dr. Shirlee Latch.  No further cardiac testing is warranted at this time. Please let us know we can be of further assistance.    Donato Schultz, MD  08/21/2012  11:37 AM

## 2012-08-21 NOTE — Anesthesia Preprocedure Evaluation (Signed)
Anesthesia Evaluation  Patient identified by MRN, date of birth, ID band Patient awake    Reviewed: Allergy & Precautions, H&P , NPO status , Patient's Chart, lab work & pertinent test results, reviewed documented beta blocker date and time   Airway Mallampati: II TM Distance: >3 FB Neck ROM: full    Dental   Pulmonary neg pulmonary ROS,  breath sounds clear to auscultation        Cardiovascular hypertension, On Medications Rhythm:regular     Neuro/Psych PSYCHIATRIC DISORDERS negative neurological ROS     GI/Hepatic Neg liver ROS, GERD-  Medicated and Controlled,  Endo/Other  negative endocrine ROS  Renal/GU negative Renal ROS  negative genitourinary   Musculoskeletal   Abdominal   Peds  Hematology negative hematology ROS (+)   Anesthesia Other Findings See surgeon's H&P   Reproductive/Obstetrics negative OB ROS                           Anesthesia Physical Anesthesia Plan  ASA: II  Anesthesia Plan: General   Post-op Pain Management:    Induction: Intravenous  Airway Management Planned: Oral ETT  Additional Equipment:   Intra-op Plan:   Post-operative Plan: Extubation in OR  Informed Consent: I have reviewed the patients History and Physical, chart, labs and discussed the procedure including the risks, benefits and alternatives for the proposed anesthesia with the patient or authorized representative who has indicated his/her understanding and acceptance.   Dental Advisory Given  Plan Discussed with: CRNA and Surgeon  Anesthesia Plan Comments:         Anesthesia Quick Evaluation

## 2012-08-21 NOTE — Progress Notes (Signed)
Orthopedic Tech Progress Note Patient Details:  [name] 1922-12-28 [id number] OHF applied to bed Patient ID: [name], female   DOB: 07/28/1922, 79 y.o.   MRN: [id number]   Orie Rout 08/21/2012, 1:16 PM

## 2012-08-21 NOTE — Interval H&P Note (Signed)
History and Physical Interval Note:  08/21/2012 7:00 AM  [name]  has presented today for surgery, with the diagnosis of RIGHT ANKLE FRACTURE  The various methods of treatment have been discussed with the patient and family. After consideration of risks, benefits and other options for treatment, the patient has consented to  Procedure(s): OPEN REDUCTION INTERNAL FIXATION (ORIF) ANKLE FRACTURE (Right) as a surgical intervention .  The patient's history has been reviewed, patient examined, no change in status, stable for surgery.  I have reviewed the patient's chart and labs.  Questions were answered to the patient's satisfaction.     Salvatore Marvel A

## 2012-08-21 NOTE — H&P (View-Only) (Signed)
Subjective: 5 yof with right fibula fracture was admitted for cellulitis on Monday.  Celluilitis is improving with much less redness and some decrease in swelling.  Objective: Vital signs in last 24 hours: Temp:  [98.2 F (36.8 C)-98.3 F (36.8 C)] 98.3 F (36.8 C) (04/30 0627) Pulse Rate:  [70-90] 82 (04/30 0627) Resp:  [16-18] 16 (04/30 0627) BP: (131-151)/(51-89) 131/68 mmHg (04/30 0627) SpO2:  [97 %-99 %] 99 % (04/30 0627)  Intake/Output from previous day: 04/29 0701 - 04/30 0700 In: 720 [P.O.:720] Out: 500 [Urine:500] Intake/Output this shift:     Recent Labs  08/18/12 1803 08/18/12 2100 08/19/12 0543  HGB 10.5* 10.6* 9.8*    Recent Labs  08/18/12 2100 08/19/12 0543  WBC 5.6 4.4  RBC 3.22* 2.95*  HCT 29.6* 27.0*  PLT 234 204    Recent Labs  08/18/12 1803 08/18/12 2100 08/19/12 0543  NA 136  --  136  K 4.1  --  4.0  CL 101  --  103  CO2 26  --  27  BUN 13  --  11  CREATININE 1.04 1.03 1.03  GLUCOSE 104*  --  110*  CALCIUM 8.9  --  8.6    Recent Labs  08/18/12 1803 08/19/12 0543  INR 0.98 1.05    ABD soft Neurovascular intact Sensation intact distally Intact pulses distally less redness today however still moderate edema  Assessment/ Active Problems:   Ankle fracture, right   Cellulitis of right foot   HTN (hypertension)   GERD (gastroesophageal reflux disease)   Osteoporosis, unspecified   Depression     Plan: TO  OR in am at 7:30   NPO after midnight   Please have patient sign consent.  Plan on SNF placement for Friday.  Patients family requests [facility]   Pascal Lux 08/20/2012, 7:14 AM

## 2012-08-21 NOTE — Transfer of Care (Signed)
Immediate Anesthesia Transfer of Care Note  Patient: [name]  Procedure(s) Performed: Procedure(s): OPEN REDUCTION INTERNAL FIXATION (ORIF) ANKLE FRACTURE (Right)  Patient Location: PACU  Anesthesia Type:General  Level of Consciousness: awake and alert   Airway & Oxygen Therapy: Patient Spontanous Breathing and Patient connected to nasal cannula oxygen  Post-op Assessment: Report given to PACU RN and Post -op Vital signs reviewed and stable  Post vital signs: Reviewed and stable  Complications: No apparent anesthesia complications

## 2012-08-21 NOTE — Progress Notes (Signed)
[facility] PROGRESS NOTE  SHIAN GOODNOW ZOX:[id number] DOB: [birth year] DOA: 08/18/2012 PCP: Astrid Divine, MD  Assessment/Plan: 1-Ankle fracture, right: Patient sustained a right ankle fracture on 08/10/12 but failed conservative management with increasing displacement of the fracture.  - Patient S/P  Open reduction and internal fixation of right ankle distal  fibula fracture on  5-1.  PT per ortho.  2. Cellulitis of right foot:  - BCx NGTD  - Continue vancomycin and zosyn day 4. - May transition to bactrim DS 2 tab BID plus keflex at time of discharge  3. HTN (hypertension): - Continue metoprolol. Hold arb due to mild increase cr.  PAC and atrial ectopy:  - Continue telemetry  -Continue with metoprolol.  4. GERD (gastroesophageal reflux disease) with hx of stricture, asymptomatic  - Continue BID PPI  5. Osteoporosis, unspecified:  - Continue bisphosphonate and calcium/vitamin D supplements.  6. Depression: Stable. Continued psychotropics. 7-Renal Insufficiency: Continue with IV fluids. Hold Arb. Repeat B-met in am.    Code Status: Full Family Communication: Care discussed with daughter who was at bedside.  Disposition Plan: probably SNF.   Consultants:  Dr Thurston Hole.  Procedures:  none  Antibiotics: Vancomycin 4/28 >>  Zosyn 4/28 >>    HPI/Subjective: Patient post surgery. Feeling well. Denies chest pain or dyspnea.   Objective: Filed Vitals:   08/21/12 0848 08/21/12 0900 08/21/12 0910 08/21/12 0935  BP: 147/61 156/71 166/74 150/80  Pulse: 75 72 74 72  Temp:   98 F (36.7 C) 98 F (36.7 C)  TempSrc:      Resp: 22 17 20 16   Height:      Weight:      SpO2: 100% 100% 100% 96%    Intake/Output Summary (Last 24 hours) at 08/21/12 1104 Last data filed at 08/21/12 0900  Gross per 24 hour  Intake   1420 ml  Output     25 ml  Net   1395 ml   Filed Weights   08/18/12 1700 08/21/12 0531  Weight: 51.256 kg (113 lb) 54.522 kg (120 lb 3.2 oz)     Exam:   General:  No distress.   Cardiovascular: S 1, S 2 RRR  Respiratory: CTA  Abdomen: BS present, soft, NT  Musculoskeletal: Right foot with dressing.   Data Reviewed: Basic Metabolic Panel:  Recent Labs Lab 08/18/12 1803 08/18/12 2100 08/19/12 0543 08/21/12 0435  NA 136  --  136 139  K 4.1  --  4.0 3.5  CL 101  --  103 103  CO2 26  --  27 27  GLUCOSE 104*  --  110* 89  BUN 13  --  11 9  CREATININE 1.04 1.03 1.03 1.31*  CALCIUM 8.9  --  8.6 8.5   Liver Function Tests:  Recent Labs Lab 08/18/12 1803 08/19/12 0543  AST 15 13  ALT 9 7  ALKPHOS 53 45  BILITOT 0.3 0.6  PROT 6.3 5.6*  ALBUMIN 3.2* 2.8*   CBC:  Recent Labs Lab 08/18/12 1803 08/18/12 2100 08/19/12 0543 08/21/12 0435  WBC 5.2 5.6 4.4 4.4  NEUTROABS 3.5  --   --   --   HGB 10.5* 10.6* 9.8* 9.4*  HCT 29.2* 29.6* 27.0* 26.2*  MCV 91.8 91.9 91.5 91.9  PLT 229 234 204 207   Cardiac Enzymes: No results found for this basename: CKTOTAL, CKMB, CKMBINDEX, TROPONINI,  in the last 168 hours BNP (last 3 results) No results found for this basename: PROBNP,  in the  last 8760 hours CBG: No results found for this basename: GLUCAP,  in the last 168 hours  Recent Results (from the past 240 hour(s))  CULTURE, BLOOD (ROUTINE X 2)     Status: None   Collection Time    08/18/12  8:58 PM      Result Value Range Status   Specimen Description BLOOD LEFT ARM   Final   Special Requests BOTTLES DRAWN AEROBIC AND ANAEROBIC 10CC   Final   Culture  Setup Time 08/19/2012 02:40   Final   Culture     Final   Value:        BLOOD CULTURE RECEIVED NO GROWTH TO DATE CULTURE WILL BE HELD FOR 5 DAYS BEFORE ISSUING A FINAL NEGATIVE REPORT   Report Status PENDING   Incomplete  CULTURE, BLOOD (ROUTINE X 2)     Status: None   Collection Time    08/18/12  8:59 PM      Result Value Range Status   Specimen Description BLOOD LEFT HAND   Final   Special Requests BOTTLES DRAWN AEROBIC AND ANAEROBIC 10CC   Final    Culture  Setup Time 08/19/2012 02:40   Final   Culture     Final   Value:        BLOOD CULTURE RECEIVED NO GROWTH TO DATE CULTURE WILL BE HELD FOR 5 DAYS BEFORE ISSUING A FINAL NEGATIVE REPORT   Report Status PENDING   Incomplete  URINE CULTURE     Status: None   Collection Time    08/19/12  3:11 AM      Result Value Range Status   Specimen Description URINE, RANDOM   Final   Special Requests none   Final   Culture  Setup Time 08/19/2012 04:19   Final   Colony Count NO GROWTH   Final   Culture NO GROWTH   Final   Report Status 08/20/2012 FINAL   Final  SURGICAL PCR SCREEN     Status: None   Collection Time    08/20/12 11:29 PM      Result Value Range Status   MRSA, PCR NEGATIVE  NEGATIVE Final   Staphylococcus aureus NEGATIVE  NEGATIVE Final   Comment:            The Xpert SA Assay (FDA     approved for NASAL specimens     in patients over 42 years of age),     is one component of     a comprehensive surveillance     program.  Test performance has     been validated by [employer] for patients greater     than or equal to 62 year old.     It is not intended     to diagnose infection nor to     guide or monitor treatment.     Studies: No results found.  Scheduled Meds: . acetaminophen  650 mg Oral Q4H  . [START ON 08/22/2012] aspirin EC  325 mg Oral BID WC  . bisacodyl  10 mg Oral QAC supper  . calcium-vitamin D  1 tablet Oral BID  . docusate sodium  100 mg Oral BID  . metoprolol tartrate  12.5 mg Oral BID  . mirtazapine  30 mg Oral QHS  . pantoprazole  40 mg Oral BID  . piperacillin-tazobactam (ZOSYN)  IV  3.375 g Intravenous Q8H  . raloxifene  60 mg Oral Daily  . saccharomyces boulardii  250  mg Oral BID  . sodium chloride  3 mL Intravenous Q12H  . sodium chloride  3 mL Intravenous Q12H   Continuous Infusions: . 0.9 % NaCl with KCl 20 mEq / L    . lactated ringers      Active Problems:   Ankle fracture, right   Cellulitis of right foot   HTN  (hypertension)   GERD (gastroesophageal reflux disease)   Osteoporosis, unspecified   Depression    Time spent: 25 minutes.     REGALADO,BELKYS  [facility] Pager [phone]. If 7PM-7AM, please contact night-coverage at [email or web address], password [email or web address] 08/21/2012, 11:04 AM  LOS: 3 days

## 2012-08-21 NOTE — Op Note (Signed)
NAMELEONARDA, LEIS              ACCOUNT NO.:  [id number]  MEDICAL RECORD NO.:  [id number]  LOCATION:  5N07C                        FACILITY:  [facility]  PHYSICIAN:  Elana Alm. Thurston Hole, M.D. DATE OF BIRTH:  April 29, 1922  DATE OF PROCEDURE:  08/21/2012 DATE OF DISCHARGE:                              OPERATIVE REPORT   PREOPERATIVE DIAGNOSIS:  Right ankle distal fibula fracture.  POSTOPERATIVE DIAGNOSIS:  Right ankle distal fibula fracture.  PROCEDURE:  Open reduction and internal fixation of right ankle distal fibula fracture.  SURGEON:  Elana Alm. Thurston Hole, M.D.  ASSISTANT:  Julien Girt, PA-C  ANESTHESIA:  General.  OPERATIVE TIME:  45 minutes.  COMPLICATIONS:  None.  DESCRIPTION OF PROCEDURE:  Ms. Doristine Counter was brought to the operating room on Aug 21, 2012, placed on the operative table in the supine position. She received Ancef 1 g IV preoperatively for prophylaxis.  After being placed under general anesthesia, her right foot and leg was prepped using sterile DuraPrep and draped using sterile technique.  Time-out procedure was called, and the correct right ankle identified.  The right foot and leg was exsanguinated, and a calf tourniquet was elevated to 250 mm.  Initially through a 5-6 cm longitudinal incision based over the distal lateral fibula, initial exposure was made.  Then, underlying subcutaneous tissues were incised along with skin incision.  The peroneal tendons and sural nerve were carefully protected while the fracture was exposed.  The fracture was irrigated and then reduced anatomically while anterior to posterior 4.0 mm cancellous lag screw was placed using standard AO technique.  A 6-hole 1/3 tubular plate was then placed on the lateral distal fibula.  The most distal screw hole was drilled, measured, tapped, and the appropriate length, 4.5 mm cancellous screw placed.  The second most distal screw holes drilled, measured, and the appropriate length 3.5 mm  locking screw placed.  The 3 most proximal screw holes were drilled, measured, tapped and the appropriate length 3.5 mm cortical screws placed.  At this point, the fracture was found to be in a reduced and anatomic position.  Intraoperative fluoroscopy confirmed this.  The wound was then irrigated and closed with 0 and 2-0 Vicryl.  Subcutaneous tissues were closed with 4-0 Monocryl.  Sterile dressings were applied, and a short-leg splint and the tourniquet was released.  The patient was awakened, taken to the recovery in stable condition.  Needle and sponge counts correct x2 at the end the case.  FOLLOWUP CARE:  Ms. Emmerich will be followed inpatient until she is stable and then transferred to the SNF.     Aldahir Litaker A. Thurston Hole, M.D.     RAW/MEDQ  D:  08/21/2012  T:  08/21/2012  Job:  [id number]

## 2012-08-22 DIAGNOSIS — I4891 Unspecified atrial fibrillation: Secondary | ICD-10-CM

## 2012-08-22 LAB — CBC
HCT: 28.7 % — ABNORMAL LOW (ref 36.0–46.0)
Hemoglobin: 9.1 g/dL — ABNORMAL LOW (ref 12.0–15.0)
Hemoglobin: 9.9 g/dL — ABNORMAL LOW (ref 12.0–15.0)
MCH: 32.1 pg (ref 26.0–34.0)
MCHC: 34.5 g/dL (ref 30.0–36.0)
MCV: 93.2 fL (ref 78.0–100.0)
RBC: 2.76 MIL/uL — ABNORMAL LOW (ref 3.87–5.11)
RBC: 3.08 MIL/uL — ABNORMAL LOW (ref 3.87–5.11)

## 2012-08-22 LAB — BASIC METABOLIC PANEL
CO2: 25 mEq/L (ref 19–32)
GFR calc non Af Amer: 41 mL/min — ABNORMAL LOW (ref >90)
Glucose, Bld: 96 mg/dL (ref 70–99)
Potassium: 3.4 mEq/L — ABNORMAL LOW (ref 3.5–5.1)
Sodium: 137 mEq/L (ref 135–145)

## 2012-08-22 MED ORDER — SACCHAROMYCES BOULARDII 250 MG PO CAPS: 250.0000 mg | ORAL_CAPSULE | Freq: Two times a day (BID) | ORAL | Status: DC

## 2012-08-22 MED ORDER — METOPROLOL TARTRATE 1 MG/ML IV SOLN
5.0000 mg | Freq: Once | INTRAVENOUS | Status: DC
  Filled 2012-08-22 (×2): qty 5

## 2012-08-22 MED ORDER — VANCOMYCIN HCL IN DEXTROSE 1-5 GM/200ML-% IV SOLN
1000.0000 mg | INTRAVENOUS | Status: DC
  Administered 2012-08-22 – 2012-08-24 (×2): 1000 mg via INTRAVENOUS
  Filled 2012-08-22 (×2): qty 200

## 2012-08-22 MED ORDER — PIPERACILLIN-TAZOBACTAM 3.375 G IVPB
3.3750 g | Freq: Three times a day (TID) | INTRAVENOUS | Status: DC
  Administered 2012-08-22 – 2012-08-25 (×8): 3.375 g via INTRAVENOUS
  Filled 2012-08-22 (×10): qty 50

## 2012-08-22 MED ORDER — DILTIAZEM HCL 100 MG IV SOLR
8.0000 mg/h | INTRAVENOUS | Status: DC
  Administered 2012-08-22: 8 mg/h via INTRAVENOUS
  Administered 2012-08-22: 200 mg/h via INTRAVENOUS
  Administered 2012-08-22 – 2012-08-24 (×3): 8 mg/h via INTRAVENOUS
  Filled 2012-08-22 (×4): qty 100

## 2012-08-22 MED ORDER — CALCIUM CARBONATE-VITAMIN D 500-200 MG-UNIT PO TABS: 1.0000 | ORAL_TABLET | Freq: Two times a day (BID) | ORAL | Status: DC

## 2012-08-22 MED ORDER — RIVAROXABAN 15 MG PO TABS
15.0000 mg | ORAL_TABLET | Freq: Every day | ORAL | Status: DC
  Administered 2012-08-22 – 2012-08-24 (×3): 15 mg via ORAL
  Filled 2012-08-22 (×5): qty 1

## 2012-08-22 MED ORDER — ACETAMINOPHEN 325 MG PO TABS: 650.0000 mg | ORAL_TABLET | ORAL | Status: DC

## 2012-08-22 MED ORDER — CEPHALEXIN 500 MG PO CAPS: 500.0000 mg | ORAL_CAPSULE | Freq: Four times a day (QID) | ORAL | Status: DC

## 2012-08-22 MED ORDER — ASPIRIN 325 MG PO TBEC: 325.0000 mg | DELAYED_RELEASE_TABLET | Freq: Two times a day (BID) | ORAL | Status: DC

## 2012-08-22 MED ORDER — METOPROLOL TARTRATE 12.5 MG HALF TABLET: 12.5000 mg | ORAL_TABLET | Freq: Two times a day (BID) | ORAL | Status: DC

## 2012-08-22 NOTE — Evaluation (Signed)
Physical Therapy Evaluation Patient Details Name: [name] MRN: [id number] DOB: Mar 31, 1923 Today's Date: 08/22/2012 Time: 0254-2706 PT Time Calculation (min): 19 min  PT Assessment / Plan / Recommendation Clinical Impression  Pt is a pleasant and highly motivated 79 y.o. female s/p R ORIF secondary to fall and ankle fx. Pt was fully indpendent prior to fall. Pt plans to D/C to [facility] for ST-SNF. Pt presents with decreased mobility, decreased balance and decreased independnce with transfers secondary to NWB status on RLE. Pt requires continuous cues to maintain NWB status on R LE. Will benefit from skilled PT to maximize functional mobility.      PT Assessment  Patient needs continued PT services    Follow Up Recommendations  SNF    Does the patient have the potential to tolerate intense rehabilitation      Barriers to Discharge        Equipment Recommendations  None recommended by PT    Recommendations for Other Services     Frequency Min 4X/week    Precautions / Restrictions Precautions Precautions: Fall Restrictions Weight Bearing Restrictions: Yes RLE Weight Bearing: Non weight bearing   Pertinent Vitals/Pain C/o increased pain in R LE with activity; did not rate pain. Denied pain at rest.       Mobility  Bed Mobility Bed Mobility: Supine to Sit;Sitting - Scoot to Edge of Bed Supine to Sit: 5: Supervision Sitting - Scoot to [place] of Bed: 5: Supervision Details for Bed Mobility Assistance: required increased time due to pain; demo good technique Transfers Transfers: Sit to Stand;Stand to Sit Sit to Stand: 4: Min assist;With upper extremity assist;From bed;From chair/3-in-1 Stand to Sit: 4: Min assist;With upper extremity assist;To chair/3-in-1 Details for Transfer Assistance: requries continuous cues to maintain NWB status on R LE; has difficulty maintining NWB and keeping balance; vc's for hand placement and RW safety  Ambulation/Gait Ambulation/Gait  Assistance: 4: Min assist Ambulation Distance (Feet): 12 Feet Assistive device: Rolling walker Ambulation/Gait Assistance Details: multimodal cues to maintain NWB status on R LE; requires assistance to advance RW around corners; vc's for hop to gt sequencing  Gait Pattern: Step-to pattern;Trunk flexed Gait velocity: decreased  Stairs: No Wheelchair Mobility Wheelchair Mobility: No    Exercises     PT Diagnosis: Difficulty walking;Acute pain  PT Problem List: Decreased activity tolerance;Decreased balance;Decreased mobility;Decreased knowledge of use of DME;Decreased knowledge of precautions;Decreased safety awareness;Pain PT Treatment Interventions: Gait training;DME instruction;Stair training;Functional mobility training;Therapeutic activities;Therapeutic exercise;Balance training;Neuromuscular re-education;Patient/family education   PT Goals Acute Rehab PT Goals PT Goal Formulation: With patient Time For Goal Achievement: 08/29/12 Potential to Achieve Goals: Good Pt will go Sit to Stand: with supervision PT Goal: Sit to Stand - Progress: Goal set today Pt will go Stand to Sit: with modified independence PT Goal: Stand to Sit - Progress: Goal set today Pt will Ambulate: 16 - 50 feet;with supervision;with rolling walker PT Goal: Ambulate - Progress: Goal set today  Visit Information  Last PT Received On: 08/22/12 Assistance Needed: +1 PT/OT Co-Evaluation/Treatment: Yes    Subjective Data  Subjective: "I am very independent but i guess its time to let someone help me"  Patient Stated Goal: to be indpendent again   Prior Functioning  Home Living Lives With: Alone Type of Home: House Home Access: Stairs to enter [employer] of Steps: 2 Entrance Stairs-Rails: None Home Layout: One level Firefighter: Standard Home Adaptive Equipment: Straight cane Prior Function Level of Independence: Independent Able to Take Stairs?: Yes Driving: Yes  Vocation:  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    Copywriter, advertising Arousal/Alertness: Awake/alert Behavior During Therapy: WFL for tasks assessed/performed Overall Cognitive Status: Within Functional Limits for tasks assessed    Extremity/Trunk Assessment Right Upper Extremity Assessment RUE ROM/Strength/Tone: WFL for tasks assessed Left Upper Extremity Assessment LUE ROM/Strength/Tone: WFL for tasks assessed Right Lower Extremity Assessment RLE ROM/Strength/Tone: Unable to fully assess;Due to pain;Due to precautions (knee WFL ) RLE Sensation: WFL - Light Touch Left Lower Extremity Assessment LLE ROM/Strength/Tone: WFL for tasks assessed LLE Sensation: WFL - Light Touch Trunk Assessment Trunk Assessment: Normal   Balance Balance Balance Assessed: No  End of Session PT - End of Session Equipment Utilized During Treatment: Gait belt Activity Tolerance: Patient tolerated treatment well Patient left: in chair;with call bell/phone within reach Nurse Communication: Mobility status  GP     Donell Sievert, [place] [phone] 08/22/2012, 11:18 AM

## 2012-08-22 NOTE — Clinical Social Work Placement (Addendum)
Clinical Social Work Department  CLINICAL SOCIAL WORK PLACEMENT NOTE  08/22/2012 Patient: [name]  Account Number:  [id number] Admit date: 08/18/12 Clinical Social Worker: Sabino Niemann LCSWA Date/time: 08/22/2012 10:30 AM  Clinical Social Work is seeking post-discharge placement for this patient at the following level of care: SKILLED NURSING (*CSW will update this form in Epic as items are completed)  5/2/2014Patient/family provided with [facility] Department of Clinical Social Work's list of facilities offering this level of care within the geographic area requested by the patient (or if unable, by the patient's family).  5/2/2014Patient/family informed of their freedom to choose among providers that offer the needed level of care, that participate in Medicare, Medicaid or managed care program needed by the patient, have an available bed and are willing to accept the patient.  08/22/2012 Patient/family informed of [facility]' ownership interest in [facility], as well as of the fact that they are under no obligation to receive care at this facility.  PASARR submitted to EDS on 08/22/2012 PASARR number received from EDS on 08/22/2012 FL2 transmitted to all facilities in geographic area requested by pt/family on 08/22/2012  FL2 transmitted to all facilities within larger geographic area on  Patient informed that his/her managed care company has contracts with or will negotiate with certain facilities, including the following:  Patient/family informed of bed offers received: 08/22/2012  Patient chooses bed at [facility] Physician recommends and patient chooses bed at  Patient to be transferred to [facility] on  08/25/2012 Patient to be transferred to facility by  Ambulance The following physician request were entered in Epic:  Additional Comments:

## 2012-08-22 NOTE — Care Management Note (Signed)
CARE MANAGEMENT NOTE 08/22/2012  Patient:  [name], [name]   Account Number:  [id number]  Date Initiated:  08/22/2012  Documentation initiated by:  Vance Peper  Subjective/Objective Assessment:   79 yr old female s/p ORIF of right distal fibula fracture.     Action/Plan:   Patient is for shortterm rehab at [facility]. Patient will be going to [facility]. Social worker is aware.   Anticipated DC Date:  08/22/2012   Anticipated DC Plan:  SKILLED NURSING FACILITY  In-house referral  Clinical Social Worker      DC Planning Services  CM consult      Choice offered to / List presented to:     DME arranged  NA        HH arranged  NA      Status of service:  Completed, signed off Medicare Important Message given?   (If response is "NO", the following Medicare IM given date fields will be blank) Date Medicare IM given:   Date Additional Medicare IM given:    Discharge Disposition:  SKILLED NURSING FACILITY  Per UR Regulation:    If discussed at Long Length of Stay Meetings, dates discussed:    Comments:

## 2012-08-22 NOTE — Progress Notes (Signed)
Pts HR 120s-150s. Cardiac Monitor showing extreme tachy and a.fib. VS were taken BP 148/125, Temp 98.3, HR 168-177, O2 sat 88% on room air. O2 at 2L applied. Pt asymptomatic and resting in chair. Daughter at bedside. Paged Dr. Sunnie Nielsen. Orders given to give 5mg  of lopressor, STAT EKG, labs, and contact cardiology. Called Dr. Anne Fu explained situation. Dr. Anne Fu will get one of his associates here at the hospital to see her. Will continue to monitor.

## 2012-08-22 NOTE — Evaluation (Signed)
Occupational Therapy Evaluation Patient Details Name: [name] MRN: [id number] DOB: 03-30-1923 Today's Date: 08/22/2012 Time: 8295-6213 OT Time Calculation (min): 16 min  OT Assessment / Plan / Recommendation Clinical Impression    Pt is a pleasant and highly motivated 79 y.o. female s/p R ORIF secondary to fall and ankle fx. Pt was fully indpendent prior to fall. Pt plans to D/C to [facility] for ST-SNF. Pt presents with below problem list and at overall Mod A level for LB ADLs. Pt requires continuous cues to maintain NWB status on R LE.      OT Assessment  Patient needs continued OT Services    Follow Up Recommendations  SNF    Barriers to Discharge Decreased caregiver support    Equipment Recommendations  Other (comment) (defer to SNF)    Recommendations for Other Services    Frequency  Min 2X/week    Precautions / Restrictions Precautions Precautions: Fall Restrictions Weight Bearing Restrictions: Yes RLE Weight Bearing: Non weight bearing   Pertinent Vitals/Pain C/o increased pain in R LE with activity; did not rate pain. Denied pain at rest.      ADL  Eating/Feeding: Independent Where Assessed - Eating/Feeding: Chair Grooming: Set up Where Assessed - Grooming: Unsupported sitting Upper Body Bathing: Set up Where Assessed - Upper Body Bathing: Unsupported sitting Lower Body Bathing: Moderate assistance Where Assessed - Lower Body Bathing: Supported sit to stand Upper Body Dressing: Set up Where Assessed - Upper Body Dressing: Unsupported sitting Lower Body Dressing: Moderate assistance Where Assessed - Lower Body Dressing: Supported sit to stand Toilet Transfer: Minimal assistance Toilet Transfer Method: Sit to stand Toileting - Clothing Manipulation and Hygiene: Moderate assistance Where Assessed - Toileting Clothing Manipulation and Hygiene: Sit to stand from 3-in-1 or toilet Tub/Shower Transfer Method: Not assessed Equipment Used: Gait belt;Rolling  walker ADL Comments: Pt at Mod A level for LB ADLs with sit to stand transfer.  Pt had difficulty maintaining NWB status on LE.     OT Diagnosis: Acute pain  OT Problem List: Decreased strength;Decreased activity tolerance;Impaired balance (sitting and/or standing);Decreased knowledge of use of DME or AE;Decreased safety awareness;Decreased knowledge of precautions;Pain OT Treatment Interventions: Self-care/ADL training;DME and/or AE instruction;Therapeutic activities;Patient/family education;Balance training   OT Goals Acute Rehab OT Goals OT Goal Formulation: With patient Time For Goal Achievement: 08/29/12 Potential to Achieve Goals: Good ADL Goals Pt Will Perform Lower Body Bathing: with modified independence;Sit to stand from chair ADL Goal: Lower Body Bathing - Progress: Goal set today Pt Will Perform Lower Body Dressing: with modified independence;Sit to stand from bed;Sit to stand from chair ADL Goal: Lower Body Dressing - Progress: Goal set today Pt Will Transfer to Toilet: with modified independence;Ambulation;with DME ADL Goal: Toilet Transfer - Progress: Goal set today Pt Will Perform Toileting - Clothing Manipulation: with modified independence;Standing ADL Goal: Toileting - Clothing Manipulation - Progress: Goal set today Pt Will Perform Toileting - Hygiene: with modified independence;Sit to stand from 3-in-1/toilet;Leaning right and/or left on 3-in-1/toilet ADL Goal: Toileting - Hygiene - Progress: Goal set today  Visit Information  Last OT Received On: 08/22/12 Assistance Needed: +1 PT/OT Co-Evaluation/Treatment: Yes    Subjective Data      Prior Functioning     Home Living Lives With: Alone Type of Home: House Home Access: Stairs to enter Entrance Stairs-Number of Steps: 2 Entrance Stairs-Rails: None Home Layout: One level Firefighter: Standard Home Adaptive Equipment: Straight cane Prior Function Level of Independence: Independent Able to Take  Stairs?: Yes  Driving: Yes Vocation: 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         Vision/Perception Vision - History Baseline Vision: Wears glasses all the time Patient Visual Report: No change from baseline   Cognition  Cognition Arousal/Alertness: Awake/alert Behavior During Therapy: WFL for tasks assessed/performed Overall Cognitive Status: Within Functional Limits for tasks assessed    Extremity/Trunk Assessment Right Upper Extremity Assessment RUE ROM/Strength/Tone: WFL for tasks assessed Left Upper Extremity Assessment LUE ROM/Strength/Tone: WFL for tasks assessed     Mobility Bed Mobility Bed Mobility: Supine to Sit;Sitting - Scoot to Edge of Bed Supine to Sit: 5: Supervision Sitting - Scoot to [place] of Bed: 5: Supervision Details for Bed Mobility Assistance: required increased time due to pain; demo good technique Transfers Transfers: Sit to Stand;Stand to Sit Sit to Stand: 4: Min assist;With upper extremity assist;From bed;From chair/3-in-1 Stand to Sit: 4: Min assist;With upper extremity assist;To chair/3-in-1 Details for Transfer Assistance: requries continuous cues to maintain NWB status on R LE; has difficulty maintining NWB and keeping balance; vc's for hand placement and RW safety      Exercise   Balance   End of Session OT - End of Session Equipment Utilized During Treatment: Gait belt Activity Tolerance: Patient tolerated treatment well Patient left: in chair;with call bell/phone within reach  [employer] OTR/L [phone] 08/22/2012, 11:23 AM

## 2012-08-22 NOTE — Clinical Social Work Psychosocial (Signed)
Clinical Social Work Department  BRIEF PSYCHOSOCIAL ASSESSMENT  Patient: [name] Account Number: [id number]  Admit date: 08/18/12 Clinical Social Worker Sabino Niemann, MSW Date/Time: 08/22/2012 Referred by: Physician Date Referred: 08/21/12 Referred for   SNF Placement   Other Referral:  Interview type: Patient  Other interview type: PSYCHOSOCIAL DATA  Living Status: Alone Admitted from facility:  Level of care:  Primary support name: [name] Primary support relationship to patient: Daughter Degree of support available:  Strong and vested  CURRENT CONCERNS  Current Concerns   Post-Acute Placement   Other Concerns:  SOCIAL WORK ASSESSMENT / PLAN  CSW met with pt re: PT recommendation for SNF.   Pt lives alone  CSW explained placement process and answered questions.   Pt reports [facility]  as her preference    CSW completed FL2 and initiated SNF search.     Assessment/plan status: Information/Referral to [facility]  Other assessment/ plan:  Information/referral to community resources:  SNF   PTAR  PATIENT'S/FAMILY'S RESPONSE TO PLAN OF CARE:  Pt  reports she is agreeable to ST SNF in order to increase strength and independence with mobility prior to returning home  Pt verbalized understanding of placement process and appreciation for CSW assist.   Sabino Niemann, MSW [phone]

## 2012-08-22 NOTE — Progress Notes (Addendum)
Patient Name: [name] Date of Encounter: 08/22/2012    SUBJECTIVE: The patient is asymptomatic  TELEMETRY:  Atrial fibrillation RVR Filed Vitals:   08/21/12 1300 08/21/12 2000 08/21/12 2131 08/22/12 0546  BP: 152/57  123/64 150/53  Pulse: 60  75 69  Temp: 98.1 F (36.7 C)  98.4 F (36.9 C) 98.4 F (36.9 C)  TempSrc:      Resp: 18 18 16 16   Height:      Weight:      SpO2: 100%  100% 97%    Intake/Output Summary (Last 24 hours) at 08/22/12 1156 Last data filed at 08/22/12 0900  Gross per 24 hour  Intake   1440 ml  Output      0 ml  Net   1440 ml    LABS: Basic Metabolic Panel:  Recent Labs  47/82/95 0435 08/22/12 0530  NA 139 137  K 3.5 3.4*  CL 103 105  CO2 27 25  GLUCOSE 89 96  BUN 9 9  CREATININE 1.31* 1.17*  CALCIUM 8.5 8.6   CBC:  Recent Labs  08/22/12 0530 08/22/12 1104  WBC 5.3 7.3  HGB 9.1* 9.9*  HCT 25.6* 28.7*  MCV 92.8 93.2  PLT 196 217     Radiology/Studies:  No new.  Physical Exam: Blood pressure 150/53, pulse 69, temperature 98.4 F (36.9 C), temperature source Oral, resp. rate 16, height 5\' 3"  (1.6 m), weight 54.522 kg (120 lb 3.2 oz), SpO2 97.00%. Weight change:    Rapid irregular heart rate.  Lungs clear to A and P  ECG: A fib with RVR and rate related ST depression  ASSESSMENT:  1. Paroxysmal atrial fib with RVR, asymptomatic. CHADS score 2  2. Raises question of whether fall could have been due to syncope and Tachy Brady syndrome.  Plan:  1. IV diltiazem 2. IV heparin 3. Monitor and observe for bradycardia at conversion 4. 2 D doppler 5. TSH  Signed, Lesleigh Noe 08/22/2012, 11:56 AM

## 2012-08-22 NOTE — Progress Notes (Signed)
ANTICOAGULATION CONSULT NOTE   Pharmacy Consult for Xarelto Indication: Afib RVR and VTE prophylaxis s/p ORIF  Patient Measurements: Height: 5\' 3"  (160 cm) Weight: 120 lb 3.2 oz (54.522 kg) IBW/kg (Calculated) : 52.4   Recent Labs  08/21/12 0435 08/22/12 0530 08/22/12 1104  HGB 9.4* 9.1* 9.9*  HCT 26.2* 25.6* 28.7*  PLT 207 196 217  CREATININE 1.31* 1.17*  --    Estimated Creatinine Clearance: 28 ml/min (by C-G formula based on Cr of 1.17).  Assessment: 89yoF being started on Xarelto for atrial fibrillation RVR and VTE prophylaxis s/p ORIF. Discharge to SNF postponed due to atrial fibrillation -- patient being transferred to telemetry. Discussed anticoagulation options with both Dr. Katrinka Blazing and Julien Girt, PA-C. Surgery is ok with atrial fibrillation dosing 15mg  daily. Pharmacy asked to adjust dosing for renal dysfunction -- CrCl ~28 ml/min. CBC stable, no bleeding noted in chart.  Plan:  1) Xarelto 15mg  once daily with evening meal 2) Recommend watching renal function -- not recommended if CrCl falls <15 ml/min 3) Watch for signs/symptoms of bleeding     Benjaman Pott, PharmD, BCPS 08/22/2012   1:54 PM

## 2012-08-22 NOTE — Progress Notes (Signed)
[facility] PROGRESS NOTE  [name] ZOX:[id number] DOB: August 12, 1922 DOA: 08/18/2012 PCP: Astrid Divine, MD  Assessment/Plan:  1-Ankle fracture, right: Patient sustained a right ankle fracture on 08/10/12 but failed conservative management with increasing displacement of the fracture.  - Patient S/P  Open reduction and internal fixation of right ankle distal  fibula fracture on  5-1.  PT per ortho.   2. Cellulitis of right foot:  - BCx NGTD  - Continue vancomycin and zosyn day 5. - May transition to bactrim DS 2 tab BID plus keflex at time of discharge   3. HTN (hypertension): - Continue metoprolol. Hold arb due to mild increase cr.   4. GERD (gastroesophageal reflux disease) with hx of stricture, asymptomatic  - Continue BID PPI  5. Osteoporosis, unspecified:  - Continue bisphosphonate and calcium/vitamin D supplements.  6. Depression: Stable. Continued psychotropics. 7-Renal Insufficiency: NSL. Hold Arb. Cr decrease to 1.1 8-A fib RVR: patient convert to A fib HR 150. Cardizem IV started. Xarelto started. Appreciate DR Katrinka Blazing help/    Code Status: Full Family Communication: Care discussed with daughter who was at bedside.  Disposition Plan: probably SNF.   Consultants:  Dr Thurston Hole.  Procedures:  none  Antibiotics: Vancomycin 4/28 >>  Zosyn 4/28 >>    HPI/Subjective: Patient developed A fib RVR. She denies chest pain or dyspnea.   Objective: Filed Vitals:   08/21/12 1300 08/21/12 2000 08/21/12 2131 08/22/12 0546  BP: 152/57  123/64 150/53  Pulse: 60  75 69  Temp: 98.1 F (36.7 C)  98.4 F (36.9 C) 98.4 F (36.9 C)  TempSrc:      Resp: 18 18 16 16   Height:      Weight:      SpO2: 100%  100% 97%    Intake/Output Summary (Last 24 hours) at 08/22/12 1240 Last data filed at 08/22/12 0900  Gross per 24 hour  Intake   1440 ml  Output      0 ml  Net   1440 ml   Filed Weights   08/18/12 1700 08/21/12 0531  Weight: 51.256 kg (113 lb)  54.522 kg (120 lb 3.2 oz)    Exam:   General:  No distress.   Cardiovascular: S 1, S 2 RRR  Respiratory: CTA  Abdomen: BS present, soft, NT  Musculoskeletal: Right foot with dressing.   Data Reviewed: Basic Metabolic Panel:  Recent Labs Lab 08/18/12 1803 08/18/12 2100 08/19/12 0543 08/21/12 0435 08/22/12 0530  NA 136  --  136 139 137  K 4.1  --  4.0 3.5 3.4*  CL 101  --  103 103 105  CO2 26  --  27 27 25   GLUCOSE 104*  --  110* 89 96  BUN 13  --  11 9 9   CREATININE 1.04 1.03 1.03 1.31* 1.17*  CALCIUM 8.9  --  8.6 8.5 8.6   Liver Function Tests:  Recent Labs Lab 08/18/12 1803 08/19/12 0543  AST 15 13  ALT 9 7  ALKPHOS 53 45  BILITOT 0.3 0.6  PROT 6.3 5.6*  ALBUMIN 3.2* 2.8*   CBC:  Recent Labs Lab 08/18/12 1803 08/18/12 2100 08/19/12 0543 08/21/12 0435 08/22/12 0530 08/22/12 1104  WBC 5.2 5.6 4.4 4.4 5.3 7.3  NEUTROABS 3.5  --   --   --   --   --   HGB 10.5* 10.6* 9.8* 9.4* 9.1* 9.9*  HCT 29.2* 29.6* 27.0* 26.2* 25.6* 28.7*  MCV 91.8 91.9 91.5 91.9 92.8 93.2  PLT 229 234 204 207 196 217   Cardiac Enzymes: No results found for this basename: CKTOTAL, CKMB, CKMBINDEX, TROPONINI,  in the last 168 hours BNP (last 3 results) No results found for this basename: PROBNP,  in the last 8760 hours CBG: No results found for this basename: GLUCAP,  in the last 168 hours  Recent Results (from the past 240 hour(s))  CULTURE, BLOOD (ROUTINE X 2)     Status: None   Collection Time    08/18/12  8:58 PM      Result Value Range Status   Specimen Description BLOOD LEFT ARM   Final   Special Requests BOTTLES DRAWN AEROBIC AND ANAEROBIC 10CC   Final   Culture  Setup Time 08/19/2012 02:40   Final   Culture     Final   Value:        BLOOD CULTURE RECEIVED NO GROWTH TO DATE CULTURE WILL BE HELD FOR 5 DAYS BEFORE ISSUING A FINAL NEGATIVE REPORT   Report Status PENDING   Incomplete  CULTURE, BLOOD (ROUTINE X 2)     Status: None   Collection Time    08/18/12   8:59 PM      Result Value Range Status   Specimen Description BLOOD LEFT HAND   Final   Special Requests BOTTLES DRAWN AEROBIC AND ANAEROBIC 10CC   Final   Culture  Setup Time 08/19/2012 02:40   Final   Culture     Final   Value:        BLOOD CULTURE RECEIVED NO GROWTH TO DATE CULTURE WILL BE HELD FOR 5 DAYS BEFORE ISSUING A FINAL NEGATIVE REPORT   Report Status PENDING   Incomplete  URINE CULTURE     Status: None   Collection Time    08/19/12  3:11 AM      Result Value Range Status   Specimen Description URINE, RANDOM   Final   Special Requests none   Final   Culture  Setup Time 08/19/2012 04:19   Final   Colony Count NO GROWTH   Final   Culture NO GROWTH   Final   Report Status 08/20/2012 FINAL   Final  SURGICAL PCR SCREEN     Status: None   Collection Time    08/20/12 11:29 PM      Result Value Range Status   MRSA, PCR NEGATIVE  NEGATIVE Final   Staphylococcus aureus NEGATIVE  NEGATIVE Final   Comment:            The Xpert SA Assay (FDA     approved for NASAL specimens     in patients over 31 years of age),     is one component of     a comprehensive surveillance     program.  Test performance has     been validated by [employer] for patients greater     than or equal to 22 year old.     It is not intended     to diagnose infection nor to     guide or monitor treatment.     Studies: No results found.  Scheduled Meds: . acetaminophen  650 mg Oral Q4H  . aspirin EC  325 mg Oral BID WC  . bisacodyl  10 mg Oral QAC supper  . calcium-vitamin D  1 tablet Oral BID  . docusate sodium  100 mg Oral BID  . metoprolol tartrate  12.5 mg Oral BID  . mirtazapine  30 mg Oral QHS  . pantoprazole  40 mg Oral BID  . piperacillin-tazobactam (ZOSYN)  IV  3.375 g Intravenous Q8H  . raloxifene  60 mg Oral Daily  . saccharomyces boulardii  250 mg Oral BID  . sodium chloride  3 mL Intravenous Q12H  . sodium chloride  3 mL Intravenous Q12H   Continuous Infusions: . sodium  chloride 50 mL/hr at 08/21/12 1619  . diltiazem (CARDIZEM) infusion      Active Problems:   Ankle fracture, right   Cellulitis of right foot   HTN (hypertension)   GERD (gastroesophageal reflux disease)   Osteoporosis, unspecified   Depression   Atrial fibrillation    Time spent: 25 minutes.     [name]  [facility] Pager [phone]. If 7PM-7AM, please contact night-coverage at [email or web address], password [facility] 08/22/2012, 12:40 PM  LOS: 4 days

## 2012-08-22 NOTE — Significant Event (Signed)
Rapid Response Event Note  Overview: Time Called: 1304 Arrival Time: 1318 Event Type: Cardiac  Initial Focused Assessment: Called to patient's bedside because patient in RAF 140-160s.  Patient denies pain or SOB or fatigue.   BP 123/60  HR 150  RR 16  O2 sat 100%  Patient alert and oriented, Lung sounds clear, heart tones irregular  Interventions: 1325: Cardizem 10mg  bolus given IV and Cardizem gtt started at 8mg /hr per orders Attempted IV start unsuccessful. Tx to 2020 via bed with O2 and heart monitor.  Event Summary: Name of Physician Notified: prior to arrival Dr Sunnie Nielsen and Dr Katrinka Blazing at bedside at      at    Outcome: Transferred (Comment) (2000)     Marcellina Millin

## 2012-08-22 NOTE — Progress Notes (Signed)
ANTIBIOTIC CONSULT NOTE - INITIAL  Pharmacy Consult for Vancomycin and Zosyn Indication: R foot cellulitis  Allergies  Allergen Reactions  . Codeine Nausea And Vomiting    Patient Measurements: Height: 5\' 3"  (160 cm) Weight: 120 lb 3.2 oz (54.522 kg) IBW/kg (Calculated) : 52.4  Vital Signs: Temp: 97.7 F (36.5 C) (05/02 1434) Temp src: Oral (05/02 1434) BP: 119/63 mmHg (05/02 1434) Pulse Rate: 113 (05/02 1434) Intake/Output from previous day: 05/01 0701 - 05/02 0700 In: 2140 [P.O.:840; I.V.:1300] Out: 25 [Blood:25] Intake/Output from this shift: Total I/O In: 480 [P.O.:480] Out: -   Labs:  Recent Labs  08/21/12 0435 08/22/12 0530 08/22/12 1104  WBC 4.4 5.3 7.3  HGB 9.4* 9.1* 9.9*  PLT 207 196 217  CREATININE 1.31* 1.17*  --    Estimated Creatinine Clearance: 28 ml/min (by C-G formula based on Cr of 1.17). No results found for this basename: VANCOTROUGH, VANCOPEAK, VANCORANDOM, GENTTROUGH, GENTPEAK, GENTRANDOM, TOBRATROUGH, TOBRAPEAK, TOBRARND, AMIKACINPEAK, AMIKACINTROU, AMIKACIN,  in the last 72 hours   Microbiology: Recent Results (from the past 720 hour(s))  CULTURE, BLOOD (ROUTINE X 2)     Status: None   Collection Time    08/18/12  8:58 PM      Result Value Range Status   Specimen Description BLOOD LEFT ARM   Final   Special Requests BOTTLES DRAWN AEROBIC AND ANAEROBIC 10CC   Final   Culture  Setup Time 08/19/2012 02:40   Final   Culture     Final   Value:        BLOOD CULTURE RECEIVED NO GROWTH TO DATE CULTURE WILL BE HELD FOR 5 DAYS BEFORE ISSUING A FINAL NEGATIVE REPORT   Report Status PENDING   Incomplete  CULTURE, BLOOD (ROUTINE X 2)     Status: None   Collection Time    08/18/12  8:59 PM      Result Value Range Status   Specimen Description BLOOD LEFT HAND   Final   Special Requests BOTTLES DRAWN AEROBIC AND ANAEROBIC 10CC   Final   Culture  Setup Time 08/19/2012 02:40   Final   Culture     Final   Value:        BLOOD CULTURE RECEIVED NO  GROWTH TO DATE CULTURE WILL BE HELD FOR 5 DAYS BEFORE ISSUING A FINAL NEGATIVE REPORT   Report Status PENDING   Incomplete  URINE CULTURE     Status: None   Collection Time    08/19/12  3:11 AM      Result Value Range Status   Specimen Description URINE, RANDOM   Final   Special Requests none   Final   Culture  Setup Time 08/19/2012 04:19   Final   Colony Count NO GROWTH   Final   Culture NO GROWTH   Final   Report Status 08/20/2012 FINAL   Final  SURGICAL PCR SCREEN     Status: None   Collection Time    08/20/12 11:29 PM      Result Value Range Status   MRSA, PCR NEGATIVE  NEGATIVE Final   Staphylococcus aureus NEGATIVE  NEGATIVE Final   Comment:            The Xpert SA Assay (FDA     approved for NASAL specimens     in patients over 15 years of age),     is one component of     a comprehensive surveillance     program.  Test performance has  been validated by [facility] for patients greater     than or equal to 15 year old.     It is not intended     to diagnose infection nor to     guide or monitor treatment.    Medical History: Past Medical History  Diagnosis Date  . Arthritis   . GERD (gastroesophageal reflux disease)   . Hypertension   . Depression    Assessment: 79 y/o female s/p fall with R ankle fracture and cellulitis of foot s/p ORIF. Appears vancomycin was to start on 4/28 but there is no dose charted for that day. Vancomycin was discontinued on 5/1 due to increase in SCr.  Pharmacy consulted to resume vancomycin and Zosyn. SCr trending back down to normal. WBC are normal and patient is afebrile. Last dose of Zosyn 5/2 at 06:49.  Antibiotics: Zosyn 4/29>>5/2 Vancomycin 4/30>>  Cultures: 4/29 Urine - negative 4/28 Blood x2 - ngtd  Goal of Therapy:  Vancomycin trough level 10-15 mcg/ml  Plan:  -Zosyn 3.375 g IV q8h (infuse over 4 hrs) -Vancomycin 1000 mg IV q48h -F/U in the am  [facility], [place].D., BCPS Clinical Pharmacist Pager:  [phone] 08/22/2012 6:43 PM

## 2012-08-23 LAB — BASIC METABOLIC PANEL
BUN: 10 mg/dL (ref 6–23)
CO2: 26 mEq/L (ref 19–32)
Calcium: 8.2 mg/dL — ABNORMAL LOW (ref 8.4–10.5)
Chloride: 106 mEq/L (ref 96–112)
Creatinine, Ser: 1.18 mg/dL — ABNORMAL HIGH (ref 0.50–1.10)
Glucose, Bld: 105 mg/dL — ABNORMAL HIGH (ref 70–99)

## 2012-08-23 LAB — CBC
HCT: 25.8 % — ABNORMAL LOW (ref 36.0–46.0)
MCH: 32.4 pg (ref 26.0–34.0)
MCHC: 34.5 g/dL (ref 30.0–36.0)
MCV: 93.8 fL (ref 78.0–100.0)
RDW: 13.3 % (ref 11.5–15.5)
WBC: 6.6 10*3/uL (ref 4.0–10.5)

## 2012-08-23 MED ORDER — POLYSACCHARIDE IRON COMPLEX 150 MG PO CAPS
150.0000 mg | ORAL_CAPSULE | Freq: Every day | ORAL | Status: DC
  Administered 2012-08-23 – 2012-08-25 (×3): 150 mg via ORAL
  Filled 2012-08-23 (×3): qty 1

## 2012-08-23 NOTE — Progress Notes (Addendum)
[facility] PROGRESS NOTE  [name] YQM:[id number] DOB: 1922-12-23 DOA: 08/18/2012 PCP: Astrid Divine, MD  Assessment/Plan:  1-Ankle fracture, right: Patient sustained a right ankle fracture on 08/10/12 but failed conservative management with increasing displacement of the fracture.  - Patient S/P  Open reduction and internal fixation of right ankle distal fibula fracture on  5-1.  -PT per ortho.   2. Cellulitis of right foot:  - BCx NGTD  - Continue vancomycin and zosyn day 6. - May transition to bactrim DS 2 tab BID plus keflex at time of discharge   3. HTN (hypertension): - Continue metoprolol. Hold arb due to mild increase cr.   4. GERD (gastroesophageal reflux disease) with hx of stricture, asymptomatic  - Continue BID PPI  5. Osteoporosis, unspecified:  - Continue bisphosphonate and calcium/vitamin D supplements.  6. Depression: Stable. Continued psychotropics. 7-Renal Insufficiency: NSL. Hold Arb. Cr stable at 1.1. Continue to monitor.  8-A fib RVR: patient convert to A fib HR 150 on 5-2. Will defer to cardio transition to oral Cardizem. Continue with  Xarelto. HR in the 70, sinus.  9-Anemia: Continue to monitor hb. Will add iron table.    Code Status: Full Family Communication: Care discussed patient. Disposition Plan: probably SNF on Monday.    Consultants:  Dr Thurston Hole.  Dr Katrinka Blazing.  Procedures:  none  Antibiotics: Vancomycin 4/28 >>  Zosyn 4/28 >>    HPI/Subjective: Patient feeling well this morning. No chest pain or dyspnea. No significant pain.   Objective: Filed Vitals:   08/22/12 1348 08/22/12 1434 08/22/12 2030 08/23/12 0415  BP: 116/53 119/63 116/56 129/55  Pulse: 98 113 68 63  Temp:  97.7 F (36.5 C) 98.4 F (36.9 C) 99.2 F (37.3 C)  TempSrc:  Oral Oral Oral  Resp: 16 18 18 19   Height:      Weight:      SpO2: 98% 95% 100% 99%    Intake/Output Summary (Last 24 hours) at 08/23/12 0818 Last data filed at 08/23/12  0757  Gross per 24 hour  Intake    480 ml  Output    300 ml  Net    180 ml   Filed Weights   08/18/12 1700 08/21/12 0531  Weight: 51.256 kg (113 lb) 54.522 kg (120 lb 3.2 oz)    Exam:   General:  No distress.   Cardiovascular: S 1, S 2 RRR  Respiratory: CTA  Abdomen: BS present, soft, NT  Musculoskeletal: Right foot with cast.   Data Reviewed: Basic Metabolic Panel:  Recent Labs Lab 08/18/12 1803 08/18/12 2100 08/19/12 0543 08/21/12 0435 08/22/12 0530 08/23/12 0505  NA 136  --  136 139 137 138  K 4.1  --  4.0 3.5 3.4* 3.8  CL 101  --  103 103 105 106  CO2 26  --  27 27 25 26   GLUCOSE 104*  --  110* 89 96 105*  BUN 13  --  11 9 9 10   CREATININE 1.04 1.03 1.03 1.31* 1.17* 1.18*  CALCIUM 8.9  --  8.6 8.5 8.6 8.2*   Liver Function Tests:  Recent Labs Lab 08/18/12 1803 08/19/12 0543  AST 15 13  ALT 9 7  ALKPHOS 53 45  BILITOT 0.3 0.6  PROT 6.3 5.6*  ALBUMIN 3.2* 2.8*   CBC:  Recent Labs Lab 08/18/12 1803  08/19/12 0543 08/21/12 0435 08/22/12 0530 08/22/12 1104 08/23/12 0505  WBC 5.2  < > 4.4 4.4 5.3 7.3 6.6  NEUTROABS 3.5  --   --   --   --   --   --  HGB 10.5*  < > 9.8* 9.4* 9.1* 9.9* 8.9*  HCT 29.2*  < > 27.0* 26.2* 25.6* 28.7* 25.8*  MCV 91.8  < > 91.5 91.9 92.8 93.2 93.8  PLT 229  < > 204 207 196 217 193  < > = values in this interval not displayed. Cardiac Enzymes: No results found for this basename: CKTOTAL, CKMB, CKMBINDEX, TROPONINI,  in the last 168 hours BNP (last 3 results) No results found for this basename: PROBNP,  in the last 8760 hours CBG: No results found for this basename: GLUCAP,  in the last 168 hours  Recent Results (from the past 240 hour(s))  CULTURE, BLOOD (ROUTINE X 2)     Status: None   Collection Time    08/18/12  8:58 PM      Result Value Range Status   Specimen Description BLOOD LEFT ARM   Final   Special Requests BOTTLES DRAWN AEROBIC AND ANAEROBIC 10CC   Final   Culture  Setup Time 08/19/2012 02:40    Final   Culture     Final   Value:        BLOOD CULTURE RECEIVED NO GROWTH TO DATE CULTURE WILL BE HELD FOR 5 DAYS BEFORE ISSUING A FINAL NEGATIVE REPORT   Report Status PENDING   Incomplete  CULTURE, BLOOD (ROUTINE X 2)     Status: None   Collection Time    08/18/12  8:59 PM      Result Value Range Status   Specimen Description BLOOD LEFT HAND   Final   Special Requests BOTTLES DRAWN AEROBIC AND ANAEROBIC 10CC   Final   Culture  Setup Time 08/19/2012 02:40   Final   Culture     Final   Value:        BLOOD CULTURE RECEIVED NO GROWTH TO DATE CULTURE WILL BE HELD FOR 5 DAYS BEFORE ISSUING A FINAL NEGATIVE REPORT   Report Status PENDING   Incomplete  URINE CULTURE     Status: None   Collection Time    08/19/12  3:11 AM      Result Value Range Status   Specimen Description URINE, RANDOM   Final   Special Requests none   Final   Culture  Setup Time 08/19/2012 04:19   Final   Colony Count NO GROWTH   Final   Culture NO GROWTH   Final   Report Status 08/20/2012 FINAL   Final  SURGICAL PCR SCREEN     Status: None   Collection Time    08/20/12 11:29 PM      Result Value Range Status   MRSA, PCR NEGATIVE  NEGATIVE Final   Staphylococcus aureus NEGATIVE  NEGATIVE Final   Comment:            The Xpert SA Assay (FDA     approved for NASAL specimens     in patients over 33 years of age),     is one component of     a comprehensive surveillance     program.  Test performance has     been validated by [employer] for patients greater     than or equal to 16 year old.     It is not intended     to diagnose infection nor to     guide or monitor treatment.     Studies: No results found.  Scheduled Meds: . bisacodyl  10 mg Oral QAC supper  . calcium-vitamin D  1 tablet Oral BID  . docusate sodium  100 mg Oral BID  . metoprolol tartrate  12.5 mg Oral BID  . mirtazapine  30 mg Oral QHS  . pantoprazole  40 mg Oral BID  . piperacillin-tazobactam (ZOSYN)  IV  3.375 g Intravenous Q8H   . raloxifene  60 mg Oral Daily  . rivaroxaban  15 mg Oral Q supper  . saccharomyces boulardii  250 mg Oral BID  . sodium chloride  3 mL Intravenous Q12H  . sodium chloride  3 mL Intravenous Q12H  . vancomycin  1,000 mg Intravenous Q48H   Continuous Infusions: . diltiazem (CARDIZEM) infusion 8 mg/hr (08/22/12 2138)    Active Problems:   Ankle fracture, right   Cellulitis of right foot   HTN (hypertension)   GERD (gastroesophageal reflux disease)   Osteoporosis, unspecified   Depression   Atrial fibrillation    Time spent: 25 minutes.     Vesta Wheeland  [facility] Pager [phone]. If 7PM-7AM, please contact night-coverage at [email or web address], password [facility] 08/23/2012, 8:18 AM  LOS: 5 days

## 2012-08-23 NOTE — Progress Notes (Signed)
Physical Therapy Treatment Patient Details Name: [name] MRN: [id number] DOB: 06-Sep-1922 Today's Date: 08/23/2012 Time: 9528-4132 PT Time Calculation (min): 29 min  PT Assessment / Plan / Recommendation Comments on Treatment Session  Pt agreeable to participate in therapy.  Has difficulty maintaining NWB R LE.      Follow Up Recommendations  SNF     Does the patient have the potential to tolerate intense rehabilitation     Barriers to Discharge        Equipment Recommendations  None recommended by PT    Recommendations for Other Services    Frequency Min 4X/week   Plan Discharge plan remains appropriate    Precautions / Restrictions Precautions Precautions: Fall Restrictions Weight Bearing Restrictions: Yes RLE Weight Bearing: Non weight bearing       Mobility  Bed Mobility Bed Mobility: Supine to Sit;Sitting - Scoot to Edge of Bed Supine to Sit: 5: Supervision;HOB flat Sitting - Scoot to Edge of Bed: 5: Supervision Details for Bed Mobility Assistance: Increased time to transition  Transfers Transfers: Sit to Stand;Stand to [employer] Transfers Sit to Stand: 4: Min assist;With upper extremity assist;From bed;With armrests;From chair/3-in-1 Stand to Sit: 4: Min assist;With upper extremity assist;With armrests;To chair/3-in-1 Stand Pivot Transfers: 4: Min guard Details for Transfer Assistance: Cues for hand placement, NWB R LE, & technique.  Pt has difficulty maintaining NWB.   Ambulation/Gait Ambulation/Gait Assistance: 4: Min assist Ambulation Distance (Feet): 5 Feet Assistive device: Rolling walker Ambulation/Gait Assistance Details: Pt has difficulty maintaining NWB R LE.   Gait Pattern: Step-to pattern Stairs: No Wheelchair Mobility Wheelchair Mobility: No    Exercises General Exercises - Lower Extremity Long Arc Quad: AROM;Strengthening;Both;10 reps;Seated Hip ABduction/ADduction: AAROM;AROM;Strengthening;Both;10 reps (AAROM R LE) Straight Leg  Raises: AROM;AAROM;Strengthening;Both;10 reps (AAROM R LE) Hip Flexion/Marching: AROM;Strengthening;10 reps;Seated     PT Goals Acute Rehab PT Goals Time For Goal Achievement: 08/29/12 Potential to Achieve Goals: Good Pt will go Sit to Stand: with supervision PT Goal: Sit to Stand - Progress: Progressing toward goal Pt will go Stand to Sit: with modified independence PT Goal: Stand to Sit - Progress: Progressing toward goal Pt will Ambulate: 16 - 50 feet;with supervision;with rolling walker PT Goal: Ambulate - Progress: Progressing toward goal  Visit Information  Last PT Received On: 08/23/12 Assistance Needed: +1    Subjective Data      Cognition  Cognition Arousal/Alertness: Awake/alert Behavior During Therapy: WFL for tasks assessed/performed Overall Cognitive Status: Within Functional Limits for tasks assessed    Balance     End of Session PT - End of Session Equipment Utilized During Treatment: Gait belt Activity Tolerance: Patient tolerated treatment well Patient left: in chair;with call bell/phone within reach Nurse Communication: Mobility status     Verdell Face, [place] [phone] 08/23/2012

## 2012-08-23 NOTE — Progress Notes (Signed)
  Echocardiogram 2D Echocardiogram has been performed.  [name] FRANCES 08/23/2012, 12:17 PM

## 2012-08-24 LAB — BASIC METABOLIC PANEL
BUN: 10 mg/dL (ref 6–23)
Calcium: 8.6 mg/dL (ref 8.4–10.5)
GFR calc Af Amer: 48 mL/min — ABNORMAL LOW (ref >90)
GFR calc non Af Amer: 42 mL/min — ABNORMAL LOW (ref >90)
Glucose, Bld: 105 mg/dL — ABNORMAL HIGH (ref 70–99)
Sodium: 137 mEq/L (ref 135–145)

## 2012-08-24 LAB — CBC
Hemoglobin: 9.2 g/dL — ABNORMAL LOW (ref 12.0–15.0)
MCH: 32.5 pg (ref 26.0–34.0)
MCHC: 34.6 g/dL (ref 30.0–36.0)
RDW: 12.8 % (ref 11.5–15.5)

## 2012-08-24 MED ORDER — DILTIAZEM HCL ER COATED BEADS 120 MG PO CP24
120.0000 mg | ORAL_CAPSULE | Freq: Every day | ORAL | Status: DC
  Administered 2012-08-24 – 2012-08-25 (×2): 120 mg via ORAL
  Filled 2012-08-24 (×2): qty 1

## 2012-08-24 NOTE — Progress Notes (Signed)
[facility] PROGRESS NOTE  [name] JXB:[id number] DOB: 1922/12/03 DOA: 08/18/2012 PCP: Astrid Divine, MD  Assessment/Plan:  1-Ankle fracture, right: Patient sustained a right ankle fracture on 08/10/12 but failed conservative management with increasing displacement of the fracture.  - Patient S/P  Open reduction and internal fixation of right ankle distal fibula fracture on  5-1.  -PT per ortho.   2. Cellulitis of right foot:  - BCx NGTD  - Continue vancomycin and zosyn day 7. - Will transition to bactrim DS 2 tab BID plus keflex at time of discharge   3. HTN (hypertension): - Continue metoprolol. Hold arb due to mild increase cr.   4. GERD (gastroesophageal reflux disease) with hx of stricture, asymptomatic  - Continue BID PPI  5. Osteoporosis, unspecified:  - Continue bisphosphonate and calcium/vitamin D supplements.  6. Depression: Stable. Continued psychotropics. 7-Renal Insufficiency: NSL. Hold Arb. Cr stable at 1.1. Continue to monitor.  8-A fib RVR: patient convert to A fib HR 150 on 5-2.  transition to oral Cardizem 5-4. Continue with  Xarelto. HR in the 70, sinus.  9-Anemia: Continue to monitor hb. Continue with  iron table.    Code Status: Full Family Communication: Care discussed patient. Disposition Plan: probably SNF on Monday.    Consultants:  Dr Thurston Hole.  Dr Katrinka Blazing.  Procedures:  none  Antibiotics: Vancomycin 4/28 >>  Zosyn 4/28 >>    HPI/Subjective: Patient feeling well. No chest pain or dyspnea. No significant pain. She is sitting bed, eating lunch.   Objective: Filed Vitals:   08/23/12 1321 08/23/12 2018 08/24/12 0439 08/24/12 1019  BP: 141/59 134/70 129/62 135/59  Pulse: 65 64 69 77  Temp: 98.5 F (36.9 C) 98.9 F (37.2 C) 98.9 F (37.2 C)   TempSrc: Oral Oral Oral   Resp: 18 19 20    Height:      Weight:      SpO2: 97% 98% 94%    No intake or output data in the 24 hours ending 08/24/12 1314 Filed Weights   08/18/12 1700 08/21/12 0531  Weight: 51.256 kg (113 lb) 54.522 kg (120 lb 3.2 oz)    Exam:   General:  No distress.   Cardiovascular: S 1, S 2 RRR  Respiratory: CTA  Abdomen: BS present, soft, NT  Musculoskeletal: Right foot with cast.   Data Reviewed: Basic Metabolic Panel:  Recent Labs Lab 08/19/12 0543 08/21/12 0435 08/22/12 0530 08/23/12 0505 08/24/12 0552  NA 136 139 137 138 137  K 4.0 3.5 3.4* 3.8 3.5  CL 103 103 105 106 105  CO2 27 27 25 26 25   GLUCOSE 110* 89 96 105* 105*  BUN 11 9 9 10 10   CREATININE 1.03 1.31* 1.17* 1.18* 1.15*  CALCIUM 8.6 8.5 8.6 8.2* 8.6   Liver Function Tests:  Recent Labs Lab 08/18/12 1803 08/19/12 0543  AST 15 13  ALT 9 7  ALKPHOS 53 45  BILITOT 0.3 0.6  PROT 6.3 5.6*  ALBUMIN 3.2* 2.8*   CBC:  Recent Labs Lab 08/18/12 1803  08/21/12 0435 08/22/12 0530 08/22/12 1104 08/23/12 0505 08/24/12 0552  WBC 5.2  < > 4.4 5.3 7.3 6.6 6.6  NEUTROABS 3.5  --   --   --   --   --   --   HGB 10.5*  < > 9.4* 9.1* 9.9* 8.9* 9.2*  HCT 29.2*  < > 26.2* 25.6* 28.7* 25.8* 26.6*  MCV 91.8  < > 91.9 92.8 93.2 93.8 94.0  PLT  229  < > 207 196 217 193 201  < > = values in this interval not displayed. Cardiac Enzymes: No results found for this basename: CKTOTAL, CKMB, CKMBINDEX, TROPONINI,  in the last 168 hours BNP (last 3 results) No results found for this basename: PROBNP,  in the last 8760 hours CBG: No results found for this basename: GLUCAP,  in the last 168 hours  Recent Results (from the past 240 hour(s))  CULTURE, BLOOD (ROUTINE X 2)     Status: None   Collection Time    08/18/12  8:58 PM      Result Value Range Status   Specimen Description BLOOD LEFT ARM   Final   Special Requests BOTTLES DRAWN AEROBIC AND ANAEROBIC 10CC   Final   Culture  Setup Time 08/19/2012 02:40   Final   Culture     Final   Value:        BLOOD CULTURE RECEIVED NO GROWTH TO DATE CULTURE WILL BE HELD FOR 5 DAYS BEFORE ISSUING A FINAL NEGATIVE REPORT    Report Status PENDING   Incomplete  CULTURE, BLOOD (ROUTINE X 2)     Status: None   Collection Time    08/18/12  8:59 PM      Result Value Range Status   Specimen Description BLOOD LEFT HAND   Final   Special Requests BOTTLES DRAWN AEROBIC AND ANAEROBIC 10CC   Final   Culture  Setup Time 08/19/2012 02:40   Final   Culture     Final   Value:        BLOOD CULTURE RECEIVED NO GROWTH TO DATE CULTURE WILL BE HELD FOR 5 DAYS BEFORE ISSUING A FINAL NEGATIVE REPORT   Report Status PENDING   Incomplete  URINE CULTURE     Status: None   Collection Time    08/19/12  3:11 AM      Result Value Range Status   Specimen Description URINE, RANDOM   Final   Special Requests none   Final   Culture  Setup Time 08/19/2012 04:19   Final   Colony Count NO GROWTH   Final   Culture NO GROWTH   Final   Report Status 08/20/2012 FINAL   Final  SURGICAL PCR SCREEN     Status: None   Collection Time    08/20/12 11:29 PM      Result Value Range Status   MRSA, PCR NEGATIVE  NEGATIVE Final   Staphylococcus aureus NEGATIVE  NEGATIVE Final   Comment:            The Xpert SA Assay (FDA     approved for NASAL specimens     in patients over 10 years of age),     is one component of     a comprehensive surveillance     program.  Test performance has     been validated by [employer] for patients greater     than or equal to 69 year old.     It is not intended     to diagnose infection nor to     guide or monitor treatment.     Studies: No results found.  Scheduled Meds: . bisacodyl  10 mg Oral QAC supper  . calcium-vitamin D  1 tablet Oral BID  . diltiazem  120 mg Oral Daily  . docusate sodium  100 mg Oral BID  . iron polysaccharides  150 mg Oral Daily  . metoprolol  tartrate  12.5 mg Oral BID  . mirtazapine  30 mg Oral QHS  . pantoprazole  40 mg Oral BID  . piperacillin-tazobactam (ZOSYN)  IV  3.375 g Intravenous Q8H  . raloxifene  60 mg Oral Daily  . rivaroxaban  15 mg Oral Q supper  .  saccharomyces boulardii  250 mg Oral BID  . sodium chloride  3 mL Intravenous Q12H  . sodium chloride  3 mL Intravenous Q12H  . vancomycin  1,000 mg Intravenous Q48H   Continuous Infusions:    Active Problems:   Ankle fracture, right   Cellulitis of right foot   HTN (hypertension)   GERD (gastroesophageal reflux disease)   Osteoporosis, unspecified   Depression   Atrial fibrillation    Time spent: 25 minutes.     Aengus Sauceda  [facility] Pager [phone]. If 7PM-7AM, please contact night-coverage at [email or web address], password [facility] 08/24/2012, 1:14 PM  LOS: 6 days

## 2012-08-24 NOTE — Progress Notes (Addendum)
SUBJECTIVE:  Appears to be in NSR with PAC's  OBJECTIVE:   Vitals:   Filed Vitals:   08/23/12 1142 08/23/12 1321 08/23/12 2018 08/24/12 0439  BP:  141/59 134/70 129/62  Pulse: 71 65 64 69  Temp:  98.5 F (36.9 C) 98.9 F (37.2 C) 98.9 F (37.2 C)  TempSrc:  Oral Oral Oral  Resp:  18 19 20   Height:      Weight:      SpO2:  97% 98% 94%   I&O's:  No intake or output data in the 24 hours ending 08/24/12 0906 TELEMETRY: Reviewed telemetry pt in NSR with PSC's:     PHYSICAL EXAM General: Well developed, well nourished, in no acute distress Head: Eyes PERRLA, No xanthomas.   Normal cephalic and atramatic  Lungs:   Clear bilaterally to auscultation and percussion. Heart:   HRRR S1 S2 Pulses are 2+ & equal. Abdomen: Bowel sounds are positive, abdomen soft and non-tender without masses  Extremities:   No clubbing, cyanosis or edema.  DP +1 Neuro: Alert and oriented X 3. Psych:  Good affect, responds appropriately   LABS: Basic Metabolic Panel:  Recent Labs  40/98/11 0505 08/24/12 0552  NA 138 137  K 3.8 3.5  CL 106 105  CO2 26 25  GLUCOSE 105* 105*  BUN 10 10  CREATININE 1.18* 1.15*  CALCIUM 8.2* 8.6   Liver Function Tests: No results found for this basename: AST, ALT, ALKPHOS, BILITOT, PROT, ALBUMIN,  in the last 72 hours No results found for this basename: LIPASE, AMYLASE,  in the last 72 hours CBC:  Recent Labs  08/23/12 0505 08/24/12 0552  WBC 6.6 6.6  HGB 8.9* 9.2*  HCT 25.8* 26.6*  MCV 93.8 94.0  PLT 193 201    Recent Labs  08/22/12 1255  TSH 1.497   Anemia Panel: No results found for this basename: VITAMINB12, FOLATE, FERRITIN, TIBC, IRON, RETICCTPCT,  in the last 72 hours Coag Panel:   Lab Results  Component Value Date   INR 1.05 08/19/2012   INR 0.98 08/18/2012    RADIOLOGY: Dg Chest 2 View  08/18/2012  *RADIOLOGY REPORT*  Clinical Data: Preoperative chest x-ray.  Ankle fracture.  CHEST - 2 VIEW  Comparison: None.  Findings: Emphysema is  present.  Moderate hiatal hernia.  Bilateral pleural apical scarring/thickening.  The cardiopericardial silhouette appears within normal limits.  Aortic arch atherosclerosis.  No airspace disease.  No pleural effusion. Dextroconvex thoracic scoliosis.  Tortuous thoracic aorta noted on the lateral view.  IMPRESSION: Emphysema without acute cardiopulmonary disease.  Moderate hiatal hernia.   Original Report Authenticated By: Andreas Newport, M.D.    Dg Ankle Complete Right  08/10/2012  *RADIOLOGY REPORT*  Clinical Data: Fall, lateral ankle pain, swelling.  RIGHT ANKLE - COMPLETE 3+ VIEW  Comparison: None  Findings: There is an oblique fracture of the distal right fibula at the level of the ankle mortise.  Transverse medial malleolar fracture.  The distal fibular fracture is mildly displaced.  Marked diffuse soft tissue swelling.  IMPRESSION: Distal fibular and medial malleolar fractures.   Original Report Authenticated By: Charlett Nose, M.D.     ASSESSMENT:  1. Paroxysmal atrial fib with RVR, asymptomatic. CHADS score 2.  Now in NSR 2. Fall with ankle fx 3.  Normal LVF with mild to mod AI on echo  Plan:  1. Continue beta blocker and change IV Cardizem to PO 2. Consider changing Xarelto to Eliquis given decreased bleeding risk in elderly 3.  Monitor and observe for bradycardia      Quintella Reichert, MD  08/24/2012  9:06 AM

## 2012-08-25 ENCOUNTER — Encounter (HOSPITAL_COMMUNITY): Payer: Self-pay | Admitting: Orthopedic Surgery

## 2012-08-25 LAB — CULTURE, BLOOD (ROUTINE X 2): Culture: NO GROWTH

## 2012-08-25 MED ORDER — POLYSACCHARIDE IRON COMPLEX 150 MG PO CAPS: 150.0000 mg | ORAL_CAPSULE | Freq: Every day | ORAL | Status: DC

## 2012-08-25 MED ORDER — SULFAMETHOXAZOLE-TRIMETHOPRIM 800-160 MG PO TABS: 1.0000 | ORAL_TABLET | Freq: Two times a day (BID) | ORAL | Status: DC

## 2012-08-25 MED ORDER — RIVAROXABAN 15 MG PO TABS: 15.0000 mg | ORAL_TABLET | Freq: Every day | ORAL | Status: AC

## 2012-08-25 MED ORDER — DILTIAZEM HCL ER COATED BEADS 120 MG PO CP24: 120.0000 mg | ORAL_CAPSULE | Freq: Every day | ORAL | Status: DC

## 2012-08-25 NOTE — Care Management Note (Signed)
    Page 1 of 1   08/25/2012     4:33:56 PM   CARE MANAGEMENT NOTE 08/25/2012  Patient:  [name], [name]   Account Number:  [id number]  Date Initiated:  08/22/2012  Documentation initiated by:  Vance Peper  Subjective/Objective Assessment:   79 yr old female s/p ORIF of right distal fibula fracture.     Action/Plan:   Patient is for shortterm rehab at [facility]. Patient will be going to [facility]. Social worker is aware.   Anticipated DC Date:  08/22/2012   Anticipated DC Plan:  SKILLED NURSING FACILITY  In-house referral  Clinical Social Worker      DC Planning Services  CM consult      Choice offered to / List presented to:     DME arranged  NA        HH arranged  NA      Status of service:  Completed, signed off Medicare Important Message given?   (If response is "NO", the following Medicare IM given date fields will be blank) Date Medicare IM given:   Date Additional Medicare IM given:    Discharge Disposition:  SKILLED NURSING FACILITY  Per UR Regulation:    If discussed at Long Length of Stay Meetings, dates discussed:    Comments:  08/25/12 Nethaniel Mattie,RN,BSN [phone] PT DISCHARGING TO SNF TODAY, PER CSW ARRANGEMENTS.

## 2012-08-25 NOTE — Progress Notes (Addendum)
Pt being transported to [name] via ambulance. Report called to [place], Charity fundraiser ([facility]). Pt's brother in law notified. Vitals stable. PIV d/c. Pt taken off telemetry monitoring. central monitoring Aware.

## 2012-08-25 NOTE — Discharge Summary (Signed)
Physician Discharge Summary  [name] ZOX:[id number] DOB: 03-Jan-1923 DOA: 08/18/2012  PCP: Astrid Divine, MD  Admit date: 08/18/2012 Discharge date: 08/25/2012  Time spent: 35 minutes  Recommendations for Outpatient Follow-up:  1. Need to follow up with Dr Katrinka Blazing for further control of A fib. 2. Needs to follow up with Dr Thurston Hole for ankle fracture and cellulitis.  3. Need a B-met to follow up renal function.   Discharge Diagnoses:    Ankle fracture, right   Atrial fibrillation   Cellulitis of right foot   HTN (hypertension)   GERD (gastroesophageal reflux disease)   Osteoporosis, unspecified   Depression    Discharge Condition: Stable  Diet recommendation: Heart Healthy  Filed Weights   08/18/12 1700 08/21/12 0531  Weight: 51.256 kg (113 lb) 54.522 kg (120 lb 3.2 oz)    History of present illness:  This is an 79 year old female, direct admission to [facility] service, per request of Dr Thurston Hole, orthopedic surgeon, [facility]. It appears that patient sustained mechanical fall on 08/10/12, resulting in minimally displaced right ankle fracture. Conservative management was initially utilized, with immobilization and non-weight bearing. Patient has utilized a walker, since, but fracture has now become more displaced, and surgical treatment is planned. Patient was seen in Dr Sherene Sires office on 08/18/12, and found to have what appears to be a cellulitis of her right foot. She has a known history of OA, osteoporosis, GERD, esophageal strictures, slp multiple endoscopic dilatations, last one on 07/15/12, HTN, depression.    Hospital Course:  Very pleasant 79 year old admitted for treatment of cellulitis of right foot and repair of right ankle fracture. Patient had Open reduction and internal fixation of right ankle distal fibula fracture on 5-1. Patient developed post operative A fib with RVR. Dr Katrinka Blazing with cardiologist help with management. Patient was started on IV  Cardizem 8 mg/hr. Her HR decrease to 60 and 70. She was transition to 120 mg oral Cardizem. She was also started on metoprolol. Patient received 7 days of IV zosyn and vancomycin for Right foot cellulitis. She will be discharge on keflex and bactrim. Her Arb was on hold due to mild renal insufficiency. Cr increase mildly to 1.3. Repeated cr at 1.1.   1-Ankle fracture, right: Patient sustained a right ankle fracture on 08/10/12 but failed conservative management with increasing displacement of the fracture.  - Patient S/P Open reduction and internal fixation of right ankle distal fibula fracture on 5-1.  -PT per ortho.  2. Cellulitis of right foot:  - BCx NGTD  -Received vancomycin and zosyn day 7.  - Will transition to bactrim DS 2 tab BID plus keflex at time of discharge  3. HTN (hypertension):  - Continue metoprolol. Hold arb due to mild increase cr.  4. GERD (gastroesophageal reflux disease) with hx of stricture, asymptomatic  - Continue BID PPI  5. Osteoporosis, unspecified:  - Continue bisphosphonate and calcium/vitamin D supplements.  6. Depression: Stable. Continued psychotropics.  7-Renal Insufficiency: NSL. Hold Arb. Cr stable at 1.1. Continue to monitor.  8-A fib RVR: patient convert to A fib HR 150 on 5-2. transition to oral Cardizem 5-4. Continue with Xarelto. HR in the 70, sinus. Continue with metoprolol.  9-Anemia: Continue to monitor hb. Continue with iron table.    Procedures:  Patient S/P Open reduction and internal fixation of right ankle distal fibula fracture on 5-1.  Consultations:  Dr. Katrinka Blazing Dr Thurston Hole.   Discharge Exam: Filed Vitals:   08/24/12 1019 08/24/12 1338  08/24/12 1928 08/25/12 0542  BP: 135/59 141/71 130/60 151/69  Pulse: 77 95 74 83  Temp:  99.1 F (37.3 C) 99.4 F (37.4 C) 99.7 F (37.6 C)  TempSrc:  Oral Oral   Resp:  20 18 20   Height:      Weight:      SpO2:  96% 93% 97%    General: no distress.  Cardiovascular: S 1, S 2  IRR Respiratory: CTA  Discharge Instructions  Discharge Orders   Future Orders Complete By Expires     Diet - low sodium heart healthy  As directed     Diet - low sodium heart healthy  As directed     Discharge instructions  As directed     Comments:      Ankle Fracture Care After Refer to this sheet in the next few weeks. These discharge instructions provide you with general information on caring for yourself after you leave the hospital. Your caregiver may also give you specific instructions. Your treatment has been planned according to the most current medical practices available, but unavoidable complications sometimes occur. If you have any problems or questions after discharge, please call your caregiver. HOME INSTRUCTIONS You may resume a normal diet and activities as directed. Walk with crutches NON WEIGHT BEARING Do NOT get cast wet.  Do NOT remove dressing until you come back to the doctor Only take over-the-counter or prescription medicines for pain, discomfort, or fever as directed by your caregiver.  Eat a well-balanced diet.  Avoid lifting or driving until you are instructed otherwise.  Make an appointment to see your caregiver for stitches (suture) or staple removal as directed.   SEEK MEDICAL CARE IF: You have swelling of your calf or leg.  You develop shortness of breath or chest pain.  You have redness, swelling, or increasing pain in the wound.  There is pus or any unusual drainage coming from the surgical site.  You notice a bad smell coming from the surgical site or dressing.  The surgical site breaks open after sutures or staples have been removed.  There is persistent bleeding from the suture or staple line.  You are getting worse or are not improving.  You have any other questions or concerns.  SEEK IMMEDIATE MEDICAL CARE IF:  You have a fever.  You develop a rash.  You have difficulty breathing.  You develop any reaction or side effects to medicines given.   Your knee motion is decreasing rather than improving.  MAKE SURE YOU:  Understand these instructions.  Will watch your condition.  Will get help right away if you are not doing well or get worse.    Increase activity slowly  As directed     Increase activity slowly  As directed     Leave dressing on - Keep it clean, dry, and intact until clinic visit  As directed     Non weight bearing  As directed     Comments:      Non weight bearing right lower extremity for 6 weeks    Other Restrictions  As directed     Comments:      Non weight bearing for 6 weeks on right lower extremity        Medication List    STOP taking these medications       raloxifene 60 MG tablet  Commonly known as:  EVISTA     valsartan 160 MG tablet  Commonly known as:  DIOVAN  TAKE these medications       acetaminophen 325 MG tablet  Commonly known as:  TYLENOL  Take 2 tablets (650 mg total) by mouth every 4 (four) hours.     calcium-vitamin D 500-200 MG-UNIT per tablet  Commonly known as:  OSCAL WITH D  Take 1 tablet by mouth 2 (two) times daily.     cephALEXin 500 MG capsule  Commonly known as:  KEFLEX  Take 1 capsule (500 mg total) by mouth 4 (four) times daily.     diltiazem 120 MG 24 hr capsule  Commonly known as:  CARDIZEM CD  Take 1 capsule (120 mg total) by mouth daily.     esomeprazole 40 MG capsule  Commonly known as:  NEXIUM  Take 40 mg by mouth daily before breakfast.     iron polysaccharides 150 MG capsule  Commonly known as:  NIFEREX  Take 1 capsule (150 mg total) by mouth daily.     metoprolol tartrate 12.5 mg Tabs  Commonly known as:  LOPRESSOR  Take 0.5 tablets (12.5 mg total) by mouth 2 (two) times daily.     mirtazapine 30 MG tablet  Commonly known as:  REMERON  Take 30 mg by mouth at bedtime.     omeprazole 20 MG capsule  Commonly known as:  PRILOSEC  Take 20 mg by mouth daily.     Rivaroxaban 15 MG Tabs tablet  Commonly known as:  XARELTO  Take 1 tablet  (15 mg total) by mouth daily with supper.     saccharomyces boulardii 250 MG capsule  Commonly known as:  FLORASTOR  Take 1 capsule (250 mg total) by mouth 2 (two) times daily.     sulfamethoxazole-trimethoprim 800-160 MG per tablet  Commonly known as:  SEPTRA DS  Take 1 tablet by mouth 2 (two) times daily.       Allergies  Allergen Reactions  . Codeine Nausea And Vomiting       Follow-up Information   Follow up with Nilda Simmer, MD On 09/01/2012. (appt time 3:15pm)    Contact information:   [address] [place] [address] [phone]        The results of significant diagnostics from this hospitalization (including imaging, microbiology, ancillary and laboratory) are listed below for reference.    Significant Diagnostic Studies: Dg Chest 2 View  08/18/2012  *RADIOLOGY REPORT*  Clinical Data: Preoperative chest x-ray.  Ankle fracture.  CHEST - 2 VIEW  Comparison: None.  Findings: Emphysema is present.  Moderate hiatal hernia.  Bilateral pleural apical scarring/thickening.  The cardiopericardial silhouette appears within normal limits.  Aortic arch atherosclerosis.  No airspace disease.  No pleural effusion. Dextroconvex thoracic scoliosis.  Tortuous thoracic aorta noted on the lateral view.  IMPRESSION: Emphysema without acute cardiopulmonary disease.  Moderate hiatal hernia.   Original Report Authenticated By: Andreas Newport, M.D.    Dg Ankle Complete Right  08/10/2012  *RADIOLOGY REPORT*  Clinical Data: Fall, lateral ankle pain, swelling.  RIGHT ANKLE - COMPLETE 3+ VIEW  Comparison: None  Findings: There is an oblique fracture of the distal right fibula at the level of the ankle mortise.  Transverse medial malleolar fracture.  The distal fibular fracture is mildly displaced.  Marked diffuse soft tissue swelling.  IMPRESSION: Distal fibular and medial malleolar fractures.   Original Report Authenticated By: Charlett Nose, M.D.     Microbiology: Recent  Results (from the past 240 hour(s))  CULTURE, BLOOD (ROUTINE X 2)  Status: None   Collection Time    08/18/12  8:58 PM      Result Value Range Status   Specimen Description BLOOD LEFT ARM   Final   Special Requests BOTTLES DRAWN AEROBIC AND ANAEROBIC 10CC   Final   Culture  Setup Time 08/19/2012 02:40   Final   Culture     Final   Value:        BLOOD CULTURE RECEIVED NO GROWTH TO DATE CULTURE WILL BE HELD FOR 5 DAYS BEFORE ISSUING A FINAL NEGATIVE REPORT   Report Status PENDING   Incomplete  CULTURE, BLOOD (ROUTINE X 2)     Status: None   Collection Time    08/18/12  8:59 PM      Result Value Range Status   Specimen Description BLOOD LEFT HAND   Final   Special Requests BOTTLES DRAWN AEROBIC AND ANAEROBIC 10CC   Final   Culture  Setup Time 08/19/2012 02:40   Final   Culture     Final   Value:        BLOOD CULTURE RECEIVED NO GROWTH TO DATE CULTURE WILL BE HELD FOR 5 DAYS BEFORE ISSUING A FINAL NEGATIVE REPORT   Report Status PENDING   Incomplete  URINE CULTURE     Status: None   Collection Time    08/19/12  3:11 AM      Result Value Range Status   Specimen Description URINE, RANDOM   Final   Special Requests none   Final   Culture  Setup Time 08/19/2012 04:19   Final   Colony Count NO GROWTH   Final   Culture NO GROWTH   Final   Report Status 08/20/2012 FINAL   Final  SURGICAL PCR SCREEN     Status: None   Collection Time    08/20/12 11:29 PM      Result Value Range Status   MRSA, PCR NEGATIVE  NEGATIVE Final   Staphylococcus aureus NEGATIVE  NEGATIVE Final   Comment:            The Xpert SA Assay (FDA     approved for NASAL specimens     in patients over 42 years of age),     is one component of     a comprehensive surveillance     program.  Test performance has     been validated by [employer] for patients greater     than or equal to 63 year old.     It is not intended     to diagnose infection nor to     guide or monitor treatment.     Labs: Basic  Metabolic Panel:  Recent Labs Lab 08/19/12 0543 08/21/12 0435 08/22/12 0530 08/23/12 0505 08/24/12 0552  NA 136 139 137 138 137  K 4.0 3.5 3.4* 3.8 3.5  CL 103 103 105 106 105  CO2 27 27 25 26 25   GLUCOSE 110* 89 96 105* 105*  BUN 11 9 9 10 10   CREATININE 1.03 1.31* 1.17* 1.18* 1.15*  CALCIUM 8.6 8.5 8.6 8.2* 8.6   Liver Function Tests:  Recent Labs Lab 08/18/12 1803 08/19/12 0543  AST 15 13  ALT 9 7  ALKPHOS 53 45  BILITOT 0.3 0.6  PROT 6.3 5.6*  ALBUMIN 3.2* 2.8*   No results found for this basename: LIPASE, AMYLASE,  in the last 168 hours No results found for this basename: AMMONIA,  in the last 168  hours CBC:  Recent Labs Lab 08/18/12 1803  08/21/12 0435 08/22/12 0530 08/22/12 1104 08/23/12 0505 08/24/12 0552  WBC 5.2  < > 4.4 5.3 7.3 6.6 6.6  NEUTROABS 3.5  --   --   --   --   --   --   HGB 10.5*  < > 9.4* 9.1* 9.9* 8.9* 9.2*  HCT 29.2*  < > 26.2* 25.6* 28.7* 25.8* 26.6*  MCV 91.8  < > 91.9 92.8 93.2 93.8 94.0  PLT 229  < > 207 196 217 193 201  < > = values in this interval not displayed. Cardiac Enzymes: No results found for this basename: CKTOTAL, CKMB, CKMBINDEX, TROPONINI,  in the last 168 hours BNP: BNP (last 3 results) No results found for this basename: PROBNP,  in the last 8760 hours CBG: No results found for this basename: GLUCAP,  in the last 168 hours     Signed:  REGALADO,BELKYS  [facility] 08/25/2012, 7:28 AM

## 2012-08-25 NOTE — Progress Notes (Signed)
Physical Therapy Treatment Patient Details Name: [name] MRN: [id number] DOB: 09-Jul-1922 Today's Date: 08/25/2012 Time: 9528-4132 PT Time Calculation (min): 10 min  PT Assessment / Plan / Recommendation Comments on Treatment Session  Pt continues to have difficulty maintaining NWB on RLE.    Follow Up Recommendations  SNF     Does the patient have the potential to tolerate intense rehabilitation     Barriers to Discharge        Equipment Recommendations  None recommended by PT    Recommendations for Other Services    Frequency Min 3X/week   Plan Discharge plan remains appropriate;Frequency remains appropriate    Precautions / Restrictions Precautions Precautions: Fall Restrictions Weight Bearing Restrictions: Yes RLE Weight Bearing: Non weight bearing   Pertinent Vitals/Pain No c/o's    Mobility  Bed Mobility Bed Mobility: Sit to Supine Supine to Sit: 5: Supervision;HOB elevated Sitting - Scoot to Edge of Bed: 5: Supervision Sit to Supine: 4: Min assist Details for Bed Mobility Assistance: Assist to bring rt leg back up into bed. Transfers Sit to Stand: 4: Min assist;With upper extremity assist;From bed;With armrests;From chair/3-in-1 Stand to Sit: 4: Min assist;With upper extremity assist;With armrests;To chair/3-in-1;To bed Stand Pivot Transfers: 4: Min assist;With armrests Details for Transfer Assistance: Cues for hand placement, NWB R LE, & technique.  Pt has difficulty maintaining NWB.      Exercises     PT Diagnosis:    PT Problem List:   PT Treatment Interventions:     PT Goals Acute Rehab PT Goals PT Goal: Sit to Stand - Progress: Progressing toward goal PT Goal: Stand to Sit - Progress: Progressing toward goal  Visit Information  Last PT Received On: 08/25/12 Assistance Needed: +1    Subjective Data  Subjective: "I'm letting her handle all of that," pt stated about daughter finding a SNF for her.   Cognition   Cognition Arousal/Alertness: Awake/alert Behavior During Therapy: WFL for tasks assessed/performed Overall Cognitive Status: Within Functional Limits for tasks assessed    Balance     End of Session PT - End of Session Activity Tolerance: Patient tolerated treatment well Patient left: in bed;with call bell/phone within reach;with bed alarm set Nurse Communication: Mobility status   GP     [facility] 08/25/2012, 9:40 AM  [facility] PT [phone]

## 2012-08-25 NOTE — Clinical Social Work Note (Signed)
Clinical Social Worker facilitated patient discharge including confirming plans with patient and facility.  Patient states that her family plans to meet her at [facility].  Facility has verbalized that they are ready for patient admission.  RN to call report prior to discharge.  CSW arranged ambulance transport via [facility] to [facility].  RN notified patient family of plans to transport.  Clinical Social Worker will sign off for now as social work intervention is no longer needed. Please consult [place] again if new need arises.  Macario Golds, [place] [phone]

## 2012-08-26 ENCOUNTER — Non-Acute Institutional Stay (SKILLED_NURSING_FACILITY): Payer: Medicare Other | Admitting: Internal Medicine

## 2012-08-26 DIAGNOSIS — S8290XS Unspecified fracture of unspecified lower leg, sequela: Secondary | ICD-10-CM

## 2012-08-26 DIAGNOSIS — L03115 Cellulitis of right lower limb: Secondary | ICD-10-CM

## 2012-08-26 DIAGNOSIS — I10 Essential (primary) hypertension: Secondary | ICD-10-CM

## 2012-08-26 DIAGNOSIS — I4891 Unspecified atrial fibrillation: Secondary | ICD-10-CM

## 2012-08-26 DIAGNOSIS — L02619 Cutaneous abscess of unspecified foot: Secondary | ICD-10-CM

## 2012-08-26 DIAGNOSIS — S82891S Other fracture of right lower leg, sequela: Secondary | ICD-10-CM

## 2012-09-12 NOTE — Progress Notes (Signed)
Patient ID: [name], female   DOB: 02-19-1923, 79 y.o.   MRN: [id number]        HISTORY & PHYSICAL  DATE: 08/26/2012   FACILITY: [facility]  LEVEL OF CARE: SNF (31)  ALLERGIES:  Allergies  Allergen Reactions  . Codeine Nausea And Vomiting    CHIEF COMPLAINT:  Manage right ankle fracture, atrial fibrillation, and hypertension.    HISTORY OF PRESENT ILLNESS:  The patient is an 79 year-old, Caucasian female.    ANKLE FRACTURE: The patient had a mechanical fall and sustained a minimally displaced right ankle fracture. Initially, conservative management was utilized with immobilization and non-weightbearing, but the fracture became more displaced.   Therefore, she underwent ORIF and tolerated the procedure well. Patient is admitted to this facility for short-term rehabilitation. Her right lower extremity is in a soft cast now.   The patient denies ongoing ankle pain. No complications reported from the current medication(s) being used.    ATRIAL FIBRILLATION: the patients atrial fibrillation remains stable.  The patient denies DOE, tachycardia, orthopnea, transient neurological sx, pedal edema, palpitations, & PNDs.  No complications noted from the medications currently being used.   HTN: Pt 's HTN remains stable.  Denies CP, sob, DOE, pedal edema, headaches, dizziness or visual disturbances.  No complications from the medications currently being used.  Last BP : 130/60.  PAST MEDICAL HISTORY :  Past Medical History  Diagnosis Date  . Arthritis   . GERD (gastroesophageal reflux disease)   . Hypertension   . Depression     PAST SURGICAL HISTORY: Past Surgical History  Procedure Laterality Date  . Tonsillectomy    . Esophagogastroduodenoscopy      with dil, multiple times  . Esophagogastroduodenoscopy  06/26/2011    Procedure: ESOPHAGOGASTRODUODENOSCOPY (EGD);  Surgeon: [name], MD;  Location: [facility];  Service: Endoscopy;  Laterality: N/A;  with  c-arm  . Savory dilation  06/26/2011    Procedure: SAVORY DILATION;  Surgeon: [name], MD;  Location: [facility];  Service: Endoscopy;  Laterality: N/A;  . Esophagogastroduodenoscopy  10/22/2011    Procedure: ESOPHAGOGASTRODUODENOSCOPY (EGD);  Surgeon: Hilarie Fredrickson, MD;  Location: [facility];  Service: Endoscopy;  Laterality: N/A;  . Savory dilation  10/22/2011    Procedure: SAVORY DILATION;  Surgeon: Hilarie Fredrickson, MD;  Location: [facility];  Service: Endoscopy;  Laterality: N/A;  . Esophagogastroduodenoscopy  03/04/2012    Procedure: ESOPHAGOGASTRODUODENOSCOPY (EGD);  Surgeon: Hilarie Fredrickson, MD;  Location: [facility];  Service: Endoscopy;  Laterality: N/A;  . Savory dilation  03/04/2012    Procedure: SAVORY DILATION;  Surgeon: Hilarie Fredrickson, MD;  Location: [facility];  Service: Endoscopy;  Laterality: N/A;  . Esophagogastroduodenoscopy N/A 07/15/2012    Procedure: ESOPHAGOGASTRODUODENOSCOPY (EGD);  Surgeon: Hilarie Fredrickson, MD;  Location: [facility];  Service: Endoscopy;  Laterality: N/A;  . Savory dilation N/A 07/15/2012    Procedure: SAVORY DILATION;  Surgeon: Hilarie Fredrickson, MD;  Location: [facility];  Service: Endoscopy;  Laterality: N/A;  . Orif ankle fracture Right 08/21/2012    Procedure: OPEN REDUCTION INTERNAL FIXATION (ORIF) ANKLE FRACTURE;  Surgeon: Nilda Simmer, MD;  Location: [facility] OR;  Service: Orthopedics;  Laterality: Right;    SOCIAL HISTORY:  reports that she has never smoked. She has never used smokeless tobacco. She reports that she does not drink alcohol or use illicit drugs.  FAMILY HISTORY: None  CURRENT MEDICATIONS: Reviewed per [facility]  REVIEW OF SYSTEMS:  See  HPI otherwise 14 point ROS is negative.  PHYSICAL EXAMINATION  VS:  T 99.7      P 83     RR 20       BP 130/60      POX 97%        WT (Lb)  GENERAL: no acute distress, normal body habitus SKIN: right lower extremity is in a soft cast, warm & dry, no suspicious lesions or rashes, no excessive dryness EYES:  conjunctivae normal, sclerae normal, normal eye lids MOUTH/THROAT: lips without lesions,no lesions in the mouth,tongue is without lesions,uvula elevates in midline NECK: supple, trachea midline, no neck masses, no thyroid tenderness, no thyromegaly LYMPHATICS: no LAN in the neck, no supraclavicular LAN RESPIRATORY: breathing is even & unlabored, BS CTAB CARDIAC: RRR, no murmur,no extra heart sounds, no edema GI:  ABDOMEN: abdomen soft, normal BS, no masses, no tenderness  LIVER/SPLEEN: no hepatomegaly, no splenomegaly MUSCULOSKELETAL: HEAD: normal to inspection & palpation BACK: no kyphosis, scoliosis or spinal processes tenderness EXTREMITIES: LEFT UPPER EXTREMITY: full range of motion, normal strength & tone RIGHT UPPER EXTREMITY:  full range of motion, normal strength & tone LEFT LOWER EXTREMITY:  full range of motion, normal strength & tone RIGHT LOWER EXTREMITY: not tested PSYCHIATRIC: the patient is alert & oriented to person, affect & behavior appropriate  LABS/RADIOLOGY: Chest x-ray:  No acute findings.    Right ankle x-ray showed distal fibular and medial malleolar fractures.    Blood culture x2 showed no growth.   MRSA by PCR negative.    Glucose 105, creatinine 1.15, otherwise BMP normal.    Albumin 2.8, total protein 5.6, otherwise liver profile normal.    Hemoglobin 9.2, MCV 94, otherwise CBC normal.    ASSESSMENT/PLAN:  Right ankle fracture.  Status post ORIF.  Continue rehabilitation.   Atrial fibrillation.  Rate controlled.   Hypertension.  Stable.   Right foot cellulitis.  Continue Bactrim as prescribed.    Acute blood loss anemia.  Reassess.  Continue iron.    GERD.  Continue Nexium.    Check CBC and BMP.   I have reviewed patient's medical records received at admission/from hospitalization.  CPT CODE: 60454

## 2012-10-16 ENCOUNTER — Non-Acute Institutional Stay (SKILLED_NURSING_FACILITY): Payer: Medicare Other | Admitting: Internal Medicine

## 2012-10-16 DIAGNOSIS — K219 Gastro-esophageal reflux disease without esophagitis: Secondary | ICD-10-CM

## 2012-10-16 DIAGNOSIS — I4891 Unspecified atrial fibrillation: Secondary | ICD-10-CM

## 2012-10-16 DIAGNOSIS — I10 Essential (primary) hypertension: Secondary | ICD-10-CM

## 2012-10-16 DIAGNOSIS — D62 Acute posthemorrhagic anemia: Secondary | ICD-10-CM

## 2012-10-16 NOTE — Progress Notes (Signed)
PROGRESS NOTE  DATE: 10/16/2012  FACILITY: Nursing Home Location: [facility]  LEVEL OF CARE: SNF (31)  Routine Visit  CHIEF COMPLAINT:  Manage atrial fibrillation, HTN & acute blood loss anemia  HISTORY OF PRESENT ILLNESS:  REASSESSMENT OF ONGOING PROBLEM(S):  ATRIAL FIBRILLATION: the patients atrial fibrillation remains stable.  The patient denies DOE, tachycardia, orthopnea, transient neurological sx, pedal edema, palpitations, & PNDs.  No complications noted from the medications currently being used.  HTN: Pt 's HTN remains stable.  Denies CP, sob, DOE, pedal edema, headaches, dizziness or visual disturbances.  No complications from the medications currently being used.  Last BP :134/72.  ANEMIA: The anemia has been stable. The patient denies fatigue, melena or hematochezia. No complications from the medications currently being used.in 5/14 Hb 10.3, mcv 95.3.  PAST MEDICAL HISTORY : Reviewed.  No changes.  CURRENT MEDICATIONS: Reviewed per [facility]  REVIEW OF SYSTEMS:  GENERAL: no change in appetite, no fatigue, no weight changes, no fever, chills or weakness RESPIRATORY: no cough, SOB, DOE, wheezing, hemoptysis CARDIAC: no chest pain, edema or palpitations GI: no abdominal pain, diarrhea, constipation, heart burn, nausea or vomiting  PHYSICAL EXAMINATION  VS:  T98       P67      RR18      BP     POX % 99     WT (Lb)  GENERAL: no acute distress, thin body habitus EYES: conjunctivae normal, sclerae normal, normal eye lids NECK: supple, trachea midline, no neck masses, no thyroid tenderness, no thyromegaly LYMPHATICS: no LAN in the neck, no supraclavicular LAN RESPIRATORY: breathing is even & unlabored, BS CTAB CARDIAC: RRR, no murmur,no extra heart sounds, no edema,LLE in brace GI: abdomen soft, normal BS, no masses, no tenderness, no hepatomegaly, no splenomegaly PSYCHIATRIC: the patient is alert & oriented to person, affect & behavior  appropriate  LABS/RADIOLOGY:  5/14 Hb 10.3, mcv 95.3 ow cbc nl, cr1.12, ow bmp nl  ASSESSMENT/PLAN:  Atrial fibrillation-rate controlled. HTN-stable. Acute blood loss anemia-Fe d/c'd. GERD-rate controlled. Right ankle fx-s/p ORIF.  Cont. Rehab. Depression-stable. Check liver profile.  CPT CODE: 16109

## 2012-10-20 ENCOUNTER — Encounter: Payer: Self-pay | Admitting: Adult Health

## 2012-10-20 ENCOUNTER — Non-Acute Institutional Stay (SKILLED_NURSING_FACILITY): Payer: Medicare Other | Admitting: Adult Health

## 2012-10-20 DIAGNOSIS — S8290XD Unspecified fracture of unspecified lower leg, subsequent encounter for closed fracture with routine healing: Secondary | ICD-10-CM

## 2012-10-20 DIAGNOSIS — D62 Acute posthemorrhagic anemia: Secondary | ICD-10-CM | POA: Insufficient documentation

## 2012-10-20 DIAGNOSIS — L03119 Cellulitis of unspecified part of limb: Secondary | ICD-10-CM

## 2012-10-20 DIAGNOSIS — F329 Major depressive disorder, single episode, unspecified: Secondary | ICD-10-CM

## 2012-10-20 DIAGNOSIS — L03115 Cellulitis of right lower limb: Secondary | ICD-10-CM

## 2012-10-20 DIAGNOSIS — K219 Gastro-esophageal reflux disease without esophagitis: Secondary | ICD-10-CM

## 2012-10-20 DIAGNOSIS — I10 Essential (primary) hypertension: Secondary | ICD-10-CM

## 2012-10-20 DIAGNOSIS — S82891E Other fracture of right lower leg, subsequent encounter for open fracture type I or II with routine healing: Secondary | ICD-10-CM

## 2012-10-20 DIAGNOSIS — I4891 Unspecified atrial fibrillation: Secondary | ICD-10-CM

## 2012-10-20 NOTE — Progress Notes (Signed)
  Subjective:    Patient ID: [name], female    DOB: 1922/10/01, 79 y.o.   MRN: [id number]  HPI This is an 79 year old female who is for discharge home with Home health PT, OT, Nursing, CNA, Speech therapy and social worker. DME: Rolling walke due to unsteady gait. She has been admitted to [facility] on 08/25/12 from [facility] with right ankle fracture S/P ORIF and Cellulitis of right foot. She has completed SNF rehabilitation and therapy has cleared the patient for discharge.   Review of Systems  Constitutional: Negative.   HENT: Negative.   Eyes: Negative.   Respiratory: Negative for cough and shortness of breath.   Cardiovascular: Negative for leg swelling.  Gastrointestinal: Negative.   Endocrine: Negative.   Genitourinary: Negative.   Neurological: Negative.   Hematological: Negative for adenopathy. Does not bruise/bleed easily.  Psychiatric/Behavioral: Negative.        Objective:   Physical Exam  Nursing note and vitals reviewed. Constitutional: She is oriented to person, place, and time. She appears well-developed and well-nourished.  HENT:  Head: Normocephalic and atraumatic.  Right Ear: External ear normal.  Left Ear: External ear normal.  Nose: Nose normal.  Mouth/Throat: Oropharynx is clear and moist.  Eyes: Conjunctivae and EOM are normal. Pupils are equal, round, and reactive to light.  Neck: Normal range of motion. Neck supple.  Cardiovascular: Normal rate, regular rhythm, normal heart sounds and intact distal pulses.   Pulmonary/Chest: Effort normal and breath sounds normal.  Abdominal: Soft. Bowel sounds are normal.  Musculoskeletal: Normal range of motion. She exhibits no edema and no tenderness.  Neurological: She is alert and oriented to person, place, and time.  Skin: Skin is warm and dry.  Psychiatric: She has a normal mood and affect. Her behavior is normal. Judgment and thought content normal.    LABS: 09/19/12  Wbc 4.8  hgb 10.3   hct 30.6 09/08/12  Prealbumin 24.9 08/26/12  Wbc 5.8  hgb 9.4  hct 27.1  NA 139  K 3.8  Glucose 82  BUN 8  Creatinine 1.12   Medications reviewed per [facility]      Assessment & Plan:   Acute blood loss anemia - stable  Atrial fibrillation - stable  Ankle fracture, right S/P ORIF for Home health PT, OT, CNA, ST and Social worker  Cellulitis of right foot - resolved  HTN (hypertension) - well-controlled  GERD (gastroesophageal reflux disease) - stable  Depression - stable  GERD - stable    Total discharge time: >30 minutes Discharge time involved coordination of the discharge process with Child psychotherapist, nursing staff and therapy department. Medical justification for Home health services/DME verified.    CPT CODE:   [id number]

## 2012-11-10 ENCOUNTER — Non-Acute Institutional Stay (SKILLED_NURSING_FACILITY): Payer: Medicare Other | Admitting: Internal Medicine

## 2012-11-10 DIAGNOSIS — I4891 Unspecified atrial fibrillation: Secondary | ICD-10-CM

## 2012-11-10 DIAGNOSIS — D62 Acute posthemorrhagic anemia: Secondary | ICD-10-CM

## 2012-11-10 DIAGNOSIS — I10 Essential (primary) hypertension: Secondary | ICD-10-CM

## 2012-11-10 DIAGNOSIS — K219 Gastro-esophageal reflux disease without esophagitis: Secondary | ICD-10-CM

## 2012-11-14 NOTE — Progress Notes (Signed)
PROGRESS NOTE  DATE: 11-10-12  FACILITY: Nursing Home Location: [facility]  LEVEL OF CARE: SNF (31)  Routine Visit  CHIEF COMPLAINT:  Manage atrial fibrillation, HTN & acute blood loss anemia  HISTORY OF PRESENT ILLNESS:  REASSESSMENT OF ONGOING PROBLEM(S):  ATRIAL FIBRILLATION: the patients atrial fibrillation remains stable.  The patient denies DOE, tachycardia, orthopnea, transient neurological sx, pedal edema, palpitations, & PNDs.  No complications noted from the medications currently being used.  HTN: Pt 's HTN remains stable.  Denies CP, sob, DOE, pedal edema, headaches, dizziness or visual disturbances.  No complications from the medications currently being used.  Last BP :134/72, 138/57  ANEMIA: The anemia has been stable. The patient denies fatigue, melena or hematochezia. No complications from the medications currently being used.in 5/14 Hb 10.3, mcv 95.3.  PAST MEDICAL HISTORY : Reviewed.  No changes.  CURRENT MEDICATIONS: Reviewed per [facility]  REVIEW OF SYSTEMS:  GENERAL: no change in appetite, no fatigue, no weight changes, no fever, chills or weakness RESPIRATORY: no cough, SOB, DOE, wheezing, hemoptysis CARDIAC: no chest pain, edema or palpitations GI: no abdominal pain, diarrhea, constipation, heart burn, nausea or vomiting  PHYSICAL EXAMINATION  VS:  T97.6       P83      RR20      BP     POX % 99     WT (Lb)  GENERAL: no acute distress, thin body habitus NECK: supple, trachea midline, no neck masses, no thyroid tenderness, no thyromegaly RESPIRATORY: breathing is even & unlabored, BS CTAB CARDIAC: RRR, no murmur,no extra heart sounds, no edema,LLE in brace GI: abdomen soft, normal BS, no masses, no tenderness, no hepatomegaly, no splenomegaly PSYCHIATRIC: the patient is alert & oriented to person, affect & behavior appropriate  LABS/RADIOLOGY: 6/14 total protein 5.3, albumin 3.2 otherwise liver profile normal  5/14 Hb 10.3, mcv 95.3  ow cbc nl, cr1.12, ow bmp nl  ASSESSMENT/PLAN:  Atrial fibrillation-rate controlled. HTN-stable. Acute blood loss anemia-stable. GERD-rate controlled. Right ankle fx-s/p ORIF.  Cont. Rehab. Depression-stable.  CPT CODE: 09811

## 2012-11-24 ENCOUNTER — Other Ambulatory Visit: Payer: Self-pay | Admitting: Geriatric Medicine

## 2013-02-28 ENCOUNTER — Encounter: Payer: Self-pay | Admitting: Interventional Cardiology

## 2013-02-28 ENCOUNTER — Encounter: Payer: Self-pay | Admitting: *Deleted

## 2013-03-03 ENCOUNTER — Ambulatory Visit (INDEPENDENT_AMBULATORY_CARE_PROVIDER_SITE_OTHER): Payer: Medicare Other | Admitting: Interventional Cardiology

## 2013-03-03 ENCOUNTER — Encounter: Payer: Self-pay | Admitting: Interventional Cardiology

## 2013-03-03 VITALS — BP 160/70 | HR 63 | Ht 63.0 in | Wt 108.1 lb

## 2013-03-03 DIAGNOSIS — I4891 Unspecified atrial fibrillation: Secondary | ICD-10-CM

## 2013-03-03 DIAGNOSIS — I10 Essential (primary) hypertension: Secondary | ICD-10-CM

## 2013-03-03 DIAGNOSIS — Z7901 Long term (current) use of anticoagulants: Secondary | ICD-10-CM | POA: Insufficient documentation

## 2013-03-03 NOTE — Progress Notes (Signed)
Patient ID: [name], female   DOB: 1922-08-08, 79 y.o.   MRN: [id number]    [address] [place], [place]  [address] Phone: [phone] Fax:  [phone]  Date:  03/03/2013   ID:  [name], DOB 11-Apr-1923, MRN [id number]  PCP:  Astrid Divine, MD   ASSESSMENT:  1. Paroxysmal atrial fibrillation, with auscultatory findings suggesting sinus rhythm 2. Anticoagulation therapy without bleeding complications, Xarelto 15 mg daily, 3. Hypertension, controlled for age  PLAN:  1. Continue current medical regimen 2. Continue to monitor her for dose change or bleeding on anticoagulation therapy. Hemoglobin and creatinine are obtained today.   SUBJECTIVE: [name] is a 79 y.o. female who is accompanied by her daughter. No major problems have occurred since last being seen in August. She has not had syncope or palpitations. Her appetite is poor. She denies edema.   Wt Readings from Last 3 Encounters:  03/03/13 108 lb 1.9 oz (49.043 kg)  10/20/12 114 lb (51.71 kg)  08/21/12 120 lb 3.2 oz (54.522 kg)     Past Medical History  Diagnosis Date  . Arthritis   . GERD (gastroesophageal reflux disease)   . Hypertension   . Depression   . Hypercholesteremia   . Osteoporosis   . Anxiety   . Dyskinesia   . Dementia     Current Outpatient Prescriptions  Medication Sig Dispense Refill  . calcium-vitamin D (OSCAL WITH D) 500-200 MG-UNIT per tablet Take 1 tablet by mouth 2 (two) times daily.  60 tablet  0  . diltiazem (CARDIZEM CD) 120 MG 24 hr capsule Take 1 capsule (120 mg total) by mouth daily.  30 capsule  0  . IRON, FERROUS GLUCONATE, PO Take 65 mg by mouth daily.      . metoprolol tartrate (LOPRESSOR) 25 MG tablet Take 1/2 tablet in the am and 1/2 tablet in the pm      . mirtazapine (REMERON) 30 MG tablet Take 30 mg by mouth at bedtime.      Marland Kitchen omeprazole (PRILOSEC) 20 MG capsule Take 20 mg by mouth daily.      . Rivaroxaban (XARELTO) 15 MG  TABS tablet Take 1 tablet (15 mg total) by mouth daily with supper.  30 tablet  0   No current facility-administered medications for this visit.    Allergies:    Allergies  Allergen Reactions  . Codeine Nausea And Vomiting    Social History:  History   Social History  . Marital Status: Widowed    Spouse Name: N/A    Number of Children: N/A  . Years of Education: N/A   Occupational History  . Not on file.   Social History Main Topics  . Smoking status: Never Smoker   . Smokeless tobacco: Never Used  . Alcohol Use: No  . Drug Use: No  . Sexual Activity: No   Other Topics Concern  . Not on file   Social History Narrative  . No narrative on file   ROS:  Please see the history of present illness.    Denies chest pain, bleeding, syncope, edema, orthopnea, and medication side effects.   All other systems reviewed and negative.   OBJECTIVE: VS:  BP 160/70  Pulse 63  Ht 5\' 3"  (1.6 m)  Wt 108 lb 1.9 oz (49.043 kg)  BMI 19.16 kg/m2 Well nourished, well developed, in no acute distress,  elderly and frail HEENT: normal Neck: JVD  flat . Carotid  bruit  absent  Cardiac:  normal S1, S2; RRR; no murmur Lungs:  clear to auscultation bilaterally Abd: soft, nontender, no hepatomegaly Ext: Edema . Pulses 1+ bilateral  Skin: warm and dry Neuro:  CNs 2-12 intact, no focal abnormalities noted  EKG:   not performed    Signed, Darci Needle III, MD 03/03/2013 4:14 PM

## 2013-03-03 NOTE — Patient Instructions (Addendum)
Your physician recommends that you continue on your current medications as directed. Please refer to the Current Medication list given to you today.  Your physician wants you to follow-up in: 1 year You will receive a reminder letter in the mail two months in advance. If you don't receive a letter, please call our office to schedule the follow-up appointment.    Lab today Hemoglobin, Creatinine

## 2013-03-05 ENCOUNTER — Other Ambulatory Visit: Payer: Medicare Other

## 2013-03-05 ENCOUNTER — Telehealth: Payer: Self-pay

## 2013-03-05 NOTE — Telephone Encounter (Signed)
pt aware of labs.Labs are okay.pt verbalized understanding.

## 2013-03-05 NOTE — Telephone Encounter (Signed)
Message copied by Jarvis Newcomer on Thu Mar 05, 2013  8:25 AM ------      Message from: Verdis Prime      Created: Wed Mar 04, 2013  9:00 PM       Labs are okay. ------

## 2013-04-01 ENCOUNTER — Telehealth: Payer: Self-pay | Admitting: Internal Medicine

## 2013-04-01 ENCOUNTER — Encounter: Payer: Self-pay | Admitting: Internal Medicine

## 2013-04-01 NOTE — Telephone Encounter (Signed)
Pt is now on Xeralto, she fell and broke her ankle and was in an assisted living facility for a while. Daughter scheduled an OV with Dr. Marina Goodell but would like to go ahead and schedule a procedure at [facility] after OV with Dr. Marina Goodell just in case she needs to have it done.

## 2013-04-01 NOTE — Telephone Encounter (Signed)
Left message for pt's daughter to call back.

## 2013-04-03 ENCOUNTER — Other Ambulatory Visit: Payer: Self-pay

## 2013-04-03 DIAGNOSIS — R1319 Other dysphagia: Secondary | ICD-10-CM

## 2013-04-03 NOTE — Telephone Encounter (Signed)
Pt is scheduled for an OV already. Pt scheduled at [facility] tentatively for 05/18/13@10 :30am. Pt to arrive there at 9:00am. Pt scheduled for EGD with savary/fluro with mac. Left message for daughter to call back.

## 2013-04-03 NOTE — Telephone Encounter (Signed)
Daughter would like to have her mother scheduled for EGD with dil at [facility] just in case her mother can have it done. Are you ok with me scheduling her at the hospital your next hospital week to have her on the schedule?

## 2013-04-03 NOTE — Telephone Encounter (Signed)
She needs an OV prior, but ok to tentatively schedule EGD/Savary w/ C-arm fluoroscopy at [facility] next hospital week. Would like MAC/propofol this time, for her. Thanks

## 2013-04-07 NOTE — Telephone Encounter (Signed)
Daughter aware.

## 2013-04-24 ENCOUNTER — Telehealth: Payer: Self-pay | Admitting: *Deleted

## 2013-04-24 ENCOUNTER — Ambulatory Visit (INDEPENDENT_AMBULATORY_CARE_PROVIDER_SITE_OTHER): Payer: Medicare Other | Admitting: Internal Medicine

## 2013-04-24 ENCOUNTER — Encounter: Payer: Self-pay | Admitting: Internal Medicine

## 2013-04-24 VITALS — BP 148/62 | HR 80 | Ht 63.0 in | Wt 109.4 lb

## 2013-04-24 DIAGNOSIS — K222 Esophageal obstruction: Secondary | ICD-10-CM

## 2013-04-24 DIAGNOSIS — Z7901 Long term (current) use of anticoagulants: Secondary | ICD-10-CM

## 2013-04-24 DIAGNOSIS — K219 Gastro-esophageal reflux disease without esophagitis: Secondary | ICD-10-CM

## 2013-04-24 DIAGNOSIS — R1310 Dysphagia, unspecified: Secondary | ICD-10-CM

## 2013-04-24 NOTE — Patient Instructions (Signed)
We have contacted Dr Verdis PrimeHenry Anselma Herbel regarding holding your Xarelto prior to your endoscopic procedure. We will contact you once we have gotten a response from him. If you have not heard from us at least 1 week prior to your procedure, call our office at [phone]. DO NOT stop Xarelto without being advised to by your doctors office.

## 2013-04-24 NOTE — Progress Notes (Signed)
HISTORY OF PRESENT ILLNESS:  [name] is a 80 y.o. female who is known to me for management of chronic distal esophageal stricture with recurrent food impaction. She has been managed in recent years with regularly scheduled endoscopies and dilation. This has allowed her to avoid food impactions. She generally has procedure every 3-6 months. Her last upper endoscopy with dilation was performed March 2014. Followup in 3-4 months recommended. However, the following month, she fractured her ankle. She required operative repair which was delayed due to to the development of acute atrial fibrillation. Since her ankle surgery, she has been on Xarelto. Other issues include progressive dementia. As best we can tell, she has not had obvious difficulty with swallowing. She always denies dysphagia, however despite significant stricturing. She is accompanied today by her daughter, [name]  REVIEW OF SYSTEMS:  All non-GI ROS negative except for irregular heart rate, memory problems  Past Medical History  Diagnosis Date  . Arthritis   . GERD (gastroesophageal reflux disease)   . Hypertension   . Depression   . Hypercholesteremia   . Osteoporosis   . Anxiety   . Dyskinesia   . Dementia   . Esophageal stricture     Past Surgical History  Procedure Laterality Date  . Tonsillectomy    . Esophagogastroduodenoscopy      with dil, multiple times  . Esophagogastroduodenoscopy  06/26/2011    Procedure: ESOPHAGOGASTRODUODENOSCOPY (EGD);  Surgeon: [name], MD;  Location: [facility];  Service: Endoscopy;  Laterality: N/A;  with c-arm  . Savory dilation  06/26/2011    Procedure: SAVORY DILATION;  Surgeon: [name], MD;  Location: [facility];  Service: Endoscopy;  Laterality: N/A;  . Esophagogastroduodenoscopy  10/22/2011    Procedure: ESOPHAGOGASTRODUODENOSCOPY (EGD);  Surgeon: Hilarie Fredrickson, MD;  Location: [facility];  Service: Endoscopy;  Laterality: N/A;  . Savory dilation  10/22/2011   Procedure: SAVORY DILATION;  Surgeon: Hilarie Fredrickson, MD;  Location: [facility];  Service: Endoscopy;  Laterality: N/A;  . Esophagogastroduodenoscopy  03/04/2012    Procedure: ESOPHAGOGASTRODUODENOSCOPY (EGD);  Surgeon: Hilarie Fredrickson, MD;  Location: [facility];  Service: Endoscopy;  Laterality: N/A;  . Savory dilation  03/04/2012    Procedure: SAVORY DILATION;  Surgeon: Hilarie Fredrickson, MD;  Location: [facility];  Service: Endoscopy;  Laterality: N/A;  . Esophagogastroduodenoscopy N/A 07/15/2012    Procedure: ESOPHAGOGASTRODUODENOSCOPY (EGD);  Surgeon: Hilarie Fredrickson, MD;  Location: [facility];  Service: Endoscopy;  Laterality: N/A;  . Savory dilation N/A 07/15/2012    Procedure: SAVORY DILATION;  Surgeon: Hilarie Fredrickson, MD;  Location: [facility];  Service: Endoscopy;  Laterality: N/A;  . Orif ankle fracture Right 08/21/2012    Procedure: OPEN REDUCTION INTERNAL FIXATION (ORIF) ANKLE FRACTURE;  Surgeon: Nilda Simmer, MD;  Location: [facility] OR;  Service: Orthopedics;  Laterality: Right;    Social History Elfa KAELEY VINJE  reports that she has never smoked. She has never used smokeless tobacco. She reports that she does not drink alcohol or use illicit drugs.  family history is not on file.  Allergies  Allergen Reactions  . Codeine Nausea And Vomiting       PHYSICAL EXAMINATION: Vital signs: BP 148/62  Pulse 80  Ht 5\' 3"  (1.6 m)  Wt 109 lb 6.4 oz (49.624 kg)  BMI 19.38 kg/m2 General: Thin, frail, but well-nourished, no acute distress HEENT: Sclerae are anicteric, conjunctiva pink. Oral mucosa intact Lungs: Clear Heart: Regular Abdomen: soft, nontender, nondistended, no obvious ascites, no  peritoneal signs, normal bowel sounds. No organomegaly. Extremities: No edema Psychiatric: alert and oriented x3. Cooperative   ASSESSMENT:  #1. History of significant esophageal stricturing, secondary to GERD, managed with periodic dilation. He presented today regarding repeat dilation. The  patient's daughter is interested in having this done to avoid recurrent food impaction as she has had in the past. She is currently stable with regards to her other medical problems. #2. History of atrial fibrillation, on Xarelto. Regular heart rate and rhythm today #3. Dementia  PLAN:  #1. Schedule upper endoscopy with esophageal dilation. This will be performed at the hospital. CRNA monitored propofol sedation. #2. Consult with the patient's cardiologist, Dr. Verdis PrimeHenry Smith, regarding the feasibility of holding Xarelto preprocedure #3. Continue PPI

## 2013-04-24 NOTE — Telephone Encounter (Signed)
04/24/2013 RE: [name] DOB: 1923-01-27 MRN: [id number]  Dear Dr Katrinka BlazingSmith:   We have scheduled the above patient for an endoscopy with esophageal dilation. Our records show that she is on anticoagulation therapy.  Please advise as to how long the patient may come off her therapy of Xarelto prior to the procedure, which is scheduled for 05/18/13. Please fax back/ or route your response to Vernia Bufforothy Tamberly Pomplun, CMA at [phone].   Sincerely,  Vernia Bufforothy Maddoxx Burkitt

## 2013-04-29 ENCOUNTER — Encounter (HOSPITAL_COMMUNITY): Payer: Self-pay | Admitting: *Deleted

## 2013-04-29 NOTE — Progress Notes (Signed)
04-29-13 1415 Pre-procedure instructions reviewed with daughter -Wilhelmina McardleBeverly Sykes.W. Guled Gahan,RN

## 2013-05-01 ENCOUNTER — Encounter (HOSPITAL_COMMUNITY): Payer: Self-pay | Admitting: Pharmacy Technician

## 2013-05-03 NOTE — Telephone Encounter (Signed)
Stay off Xarelto 48-72 hours before endo and resume > 24 hours after procedure when safe.

## 2013-05-04 ENCOUNTER — Telehealth: Payer: Self-pay

## 2013-05-04 NOTE — Telephone Encounter (Signed)
Message copied by Chrystie NoseHUNT, Nevaan Bunton R on Mon May 04, 2013  9:42 AM ------      Message from: Hilarie FredricksonPERRY, JOHN N      Created: Mon May 04, 2013  8:12 AM      Regarding: FW: Anticoagulation       Bonita QuinLinda, please contact the patient's daughter and have her hold the Xarelto 3 days prior to endoscopy with dilation. Thanks                              ----- Message -----         From: Lesleigh NoeHenry W Smith III, MD         Sent: 05/03/2013   9:03 AM           To: Hilarie FredricksonJohn N Perry, MD      Subject: Anticoagulation                                          Okay to hold Xarelto for 48-72 hours prior to endo procedure and resume when safe 24-48 hours later.       ------

## 2013-05-04 NOTE — Telephone Encounter (Signed)
Left message for pt's daughter to call back.

## 2013-05-04 NOTE — Telephone Encounter (Signed)
Left message for patient's daughter to call back. 

## 2013-05-04 NOTE — Telephone Encounter (Signed)
See 05/04/13 phone note

## 2013-05-04 NOTE — Telephone Encounter (Signed)
Spoke with pts daughter and she is aware, states she is the one who does the pill box for the pt.

## 2013-05-18 ENCOUNTER — Encounter (HOSPITAL_COMMUNITY)
Admission: RE | Disposition: A | Discharge status: Home / Self Care | Payer: Self-pay | Source: Ambulatory Visit | Attending: Internal Medicine

## 2013-05-18 ENCOUNTER — Encounter (HOSPITAL_COMMUNITY): Payer: Self-pay

## 2013-05-18 ENCOUNTER — Encounter (HOSPITAL_COMMUNITY): Payer: Medicare Other | Admitting: Anesthesiology

## 2013-05-18 ENCOUNTER — Ambulatory Visit (HOSPITAL_COMMUNITY): Payer: Medicare Other | Admitting: Anesthesiology

## 2013-05-18 ENCOUNTER — Ambulatory Visit (HOSPITAL_COMMUNITY)
Admission: RE | Admit: 2013-05-18 | Discharge: 2013-05-18 | Disposition: A | Discharge status: Home / Self Care | Payer: Medicare Other | Source: Ambulatory Visit | Attending: Internal Medicine | Admitting: Internal Medicine

## 2013-05-18 ENCOUNTER — Ambulatory Visit (HOSPITAL_COMMUNITY): Payer: Medicare Other

## 2013-05-18 DIAGNOSIS — F0390 Unspecified dementia without behavioral disturbance: Secondary | ICD-10-CM | POA: Insufficient documentation

## 2013-05-18 DIAGNOSIS — I10 Essential (primary) hypertension: Secondary | ICD-10-CM | POA: Insufficient documentation

## 2013-05-18 DIAGNOSIS — D649 Anemia, unspecified: Secondary | ICD-10-CM | POA: Insufficient documentation

## 2013-05-18 DIAGNOSIS — I4891 Unspecified atrial fibrillation: Secondary | ICD-10-CM | POA: Insufficient documentation

## 2013-05-18 DIAGNOSIS — Z7901 Long term (current) use of anticoagulants: Secondary | ICD-10-CM | POA: Diagnosis not present

## 2013-05-18 DIAGNOSIS — K219 Gastro-esophageal reflux disease without esophagitis: Secondary | ICD-10-CM

## 2013-05-18 DIAGNOSIS — K222 Esophageal obstruction: Secondary | ICD-10-CM | POA: Diagnosis not present

## 2013-05-18 DIAGNOSIS — R1319 Other dysphagia: Secondary | ICD-10-CM

## 2013-05-18 HISTORY — DX: Cardiac arrhythmia, unspecified: I49.9

## 2013-05-18 HISTORY — PX: SAVORY DILATION: SHX5439

## 2013-05-18 HISTORY — PX: ESOPHAGOGASTRODUODENOSCOPY: SHX5428

## 2013-05-18 SURGERY — EGD (ESOPHAGOGASTRODUODENOSCOPY)
Anesthesia: Monitor Anesthesia Care

## 2013-05-18 MED ORDER — PROPOFOL 10 MG/ML IV BOLUS
INTRAVENOUS | Status: AC
  Filled 2013-05-18: qty 20

## 2013-05-18 MED ORDER — BUTAMBEN-TETRACAINE-BENZOCAINE 2-2-14 % EX AERO
INHALATION_SPRAY | CUTANEOUS | Status: DC | PRN
  Administered 2013-05-18: 1 via TOPICAL

## 2013-05-18 MED ORDER — GLYCOPYRROLATE 0.2 MG/ML IJ SOLN
INTRAMUSCULAR | Status: AC
  Filled 2013-05-18: qty 1

## 2013-05-18 MED ORDER — LACTATED RINGERS IV SOLN
INTRAVENOUS | Status: DC | PRN
  Administered 2013-05-18: 10:00:00 via INTRAVENOUS

## 2013-05-18 MED ORDER — LIDOCAINE HCL (CARDIAC) 20 MG/ML IV SOLN
INTRAVENOUS | Status: AC
  Filled 2013-05-18: qty 5

## 2013-05-18 MED ORDER — LIDOCAINE HCL (CARDIAC) 20 MG/ML IV SOLN
INTRAVENOUS | Status: DC | PRN
  Administered 2013-05-18: 50 mg via INTRAVENOUS

## 2013-05-18 MED ORDER — PROPOFOL INFUSION 10 MG/ML OPTIME
INTRAVENOUS | Status: DC | PRN
  Administered 2013-05-18: 140 ug/kg/min via INTRAVENOUS

## 2013-05-18 MED ORDER — FENTANYL CITRATE 0.05 MG/ML IJ SOLN
INTRAMUSCULAR | Status: AC
  Filled 2013-05-18: qty 2

## 2013-05-18 MED ORDER — SODIUM CHLORIDE 0.9 % IV SOLN: INTRAVENOUS | Status: DC

## 2013-05-18 MED ORDER — GLYCOPYRROLATE 0.2 MG/ML IJ SOLN
INTRAMUSCULAR | Status: DC | PRN
  Administered 2013-05-18: 0.2 mg via INTRAVENOUS

## 2013-05-18 MED ORDER — KETAMINE HCL 10 MG/ML IJ SOLN
INTRAMUSCULAR | Status: DC | PRN
  Administered 2013-05-18: 7.5 mg via INTRAVENOUS

## 2013-05-18 NOTE — Anesthesia Postprocedure Evaluation (Signed)
  Anesthesia Post-op Note  Patient: [name]  Procedure(s) Performed: Procedure(s) (LRB): ESOPHAGOGASTRODUODENOSCOPY (EGD) (N/A) SAVORY DILATION (N/A)  Patient Location: PACU  Anesthesia Type: MAC  Level of Consciousness: awake and alert   Airway and Oxygen Therapy: Patient Spontanous Breathing  Post-op Pain: mild  Post-op Assessment: Post-op Vital signs reviewed, Patient's Cardiovascular Status Stable, Respiratory Function Stable, Patent Airway and No signs of Nausea or vomiting  Last Vitals:  Filed Vitals:   05/18/13 1220  BP: 186/74  Pulse:   Temp:   Resp: 12    Post-op Vital Signs: stable   Complications: No apparent anesthesia complications

## 2013-05-18 NOTE — Discharge Instructions (Addendum)
Monitored Anesthesia Care  °Monitored anesthesia care is an anesthesia service for a medical procedure. Anesthesia is the loss of the ability to feel pain. It is produced by medications called anesthetics. It may affect a small area of your body (local anesthesia), a large area of your body (regional anesthesia), or your entire body (general anesthesia). The need for monitored anesthesia care depends your procedure, your condition, and the potential need for regional or general anesthesia. It is often provided during procedures where:  °· General anesthesia may be needed if there are complications. This is because you need special care when you are under general anesthesia.   °· You will be under local or regional anesthesia. This is so that you are able to have higher levels of anesthesia if needed.   °· You will receive calming medications (sedatives). This is especially the case if sedatives are given to put you in a semi-conscious state of relaxation (deep sedation). This is because the amount of sedative needed to produce this state can be hard to predict. Too much of a sedative can produce general anesthesia. °Monitored anesthesia care is performed by one or more caregivers who have special training in all types of anesthesia. You will need to meet with these caregivers before your procedure. During this meeting, they will ask you about your medical history. They will also give you instructions to follow. (For example, you will need to stop eating and drinking before your procedure. You may also need to stop or change medications you are taking.) During your procedure, your caregivers will stay with you. They will:  °· Watch your condition. This includes watching you blood pressure, breathing, and level of pain.   °· Diagnose and treat problems that occur.   °· Give medications if they are needed. These may include calming medications (sedatives) and anesthetics.   °· Make sure you are comfortable.   °Having  monitored anesthesia care does not necessarily mean that you will be under anesthesia. It does mean that your caregivers will be able to manage anesthesia if you need it or if it occurs. It also means that you will be able to have a different type of anesthesia than you are having if you need it. When your procedure is complete, your caregivers will continue to watch your condition. They will make sure any medications wear off before you are allowed to go home.  °Document Released: 01/03/2005 Document Revised: 08/04/2012 Document Reviewed: 05/21/2012 °Exit[employer]® Patient Information ©2014 [employer]. ° °

## 2013-05-18 NOTE — Op Note (Signed)
[facility] [address]  [place], [id number]   ENDOSCOPY PROCEDURE REPORT  PATIENT: [name], Connie K.  MR#: [id number] BIRTHDATE: [birth year] , 89  yrs. old GENDER: Female ENDOSCOPIST: Roxy CedarJohn N Perry Jr, MD REFERRED BY:  .  Self / Office PROCEDURE DATE:  05/18/2013 PROCEDURE:  EGD  and Savary dilation of esophagus  -18 mm ASA CLASS:     Class III INDICATIONS:  Dysphagia.   Therapeutic procedure. MEDICATIONS: MAC sedation, administered by CRNA and See Anesthesia Report. TOPICAL ANESTHETIC: Cetacaine Spray  DESCRIPTION OF PROCEDURE: After the risks benefits and alternatives of the procedure were thoroughly explained, informed consent was obtained.  The EG-[id number] ([id number]) endoscope was introduced through the mouth and advanced to the second portion of the duodenum. Without limitations.  The instrument was slowly withdrawn as the mucosa was fully examined.    EXAM:The esophagus revealed 12 mm strictureat the gastroesophageal junction.  No esophagitis.  The stomach revealed changes in the antrum consistent with gastric antral vascular ectasia (GAVE).  The duodenum was normal.  Retroflexed views revealed a hiatal hernia. THERAPY: With fluoroscopic assistance a guidewire was placed into the gastric antrum. An 18 mm Savary dilator was passed over the guidewire and monitored with fluoroscopy. Relook endoscopy revealed disruption of the distal ring with minor bleeding. COMPLICATIONS: There were no complications. ENDOSCOPIC IMPRESSION: 1. Esophageal stricture status post dilation 2. GAVE  RECOMMENDATIONS: 1.  Clear liquids until 1 PM, then soft foods rest of day.  Resume prior diet tomorrow. 2.  Continue PPI 3. Once daily iron supplement. Can get over-the-counter. 65 mg of elemental iron 4. Would restart Xarelto tomorrow 5. Consider repeat dilation in 6 months  REPEAT EXAM:  eSigned:  Roxy CedarJohn N Perry Jr, MD 05/18/2013 11:45 AM   NF:[id number]:Elaine Valentina LucksGriffin, MD and The  Patient

## 2013-05-18 NOTE — Anesthesia Preprocedure Evaluation (Addendum)
Anesthesia Evaluation  Patient identified by MRN, date of birth, ID band Patient awake    Reviewed: Allergy & Precautions, H&P , NPO status , Patient's Chart, lab work & pertinent test results  Airway Mallampati: II TM Distance: >3 FB Neck ROM: Full    Dental no notable dental hx.    Pulmonary neg pulmonary ROS,  breath sounds clear to auscultation  Pulmonary exam normal       Cardiovascular hypertension, Pt. on medications negative cardio ROS  + dysrhythmias Rhythm:Regular Rate:Normal  Saw Dr. Mendel RyderH. Smith in November 2014   Neuro/Psych PSYCHIATRIC DISORDERS Anxiety Depression Minor dementia per daughter. negative neurological ROS     GI/Hepatic Neg liver ROS, GERD-  Medicated,  Endo/Other  negative endocrine ROS  Renal/GU negative Renal ROS  negative genitourinary   Musculoskeletal negative musculoskeletal ROS (+)   Abdominal   Peds negative pediatric ROS (+)  Hematology  (+) anemia ,   Anesthesia Other Findings   Reproductive/Obstetrics negative OB ROS                         Anesthesia Physical Anesthesia Plan  ASA: III  Anesthesia Plan: MAC   Post-op Pain Management:    Induction: Intravenous  Airway Management Planned:   Additional Equipment:   Intra-op Plan:   Post-operative Plan:   Informed Consent: I have reviewed the patients History and Physical, chart, labs and discussed the procedure including the risks, benefits and alternatives for the proposed anesthesia with the patient or authorized representative who has indicated his/her understanding and acceptance.   Dental advisory given  Plan Discussed with: CRNA  Anesthesia Plan Comments:         Anesthesia Quick Evaluation

## 2013-05-18 NOTE — Transfer of Care (Signed)
Immediate Anesthesia Transfer of Care Note  Patient: [name]  Procedure(s) Performed: Procedure(s): ESOPHAGOGASTRODUODENOSCOPY (EGD) (N/A) SAVORY DILATION (N/A)  Patient Location: PACU  Anesthesia Type:MAC  Level of Consciousness: awake, alert  and oriented  Airway & Oxygen Therapy: Patient Spontanous Breathing and Patient connected to face mask oxygen  Post-op Assessment: Report given to PACU RN and Post -op Vital signs reviewed and stable  Post vital signs: Reviewed and stable  Complications: No apparent anesthesia complications

## 2013-05-18 NOTE — Preoperative (Signed)
Beta Blockers   Reason not to administer Beta Blockers:Not Applicable 

## 2013-05-18 NOTE — Interval H&P Note (Signed)
History and Physical Interval Note:  05/18/2013 11:04 AM  [name]  has presented today for surgery, with the diagnosis of 787.20  The various methods of treatment have been discussed with the patient and family. After consideration of risks, benefits and other options for treatment, the patient has consented to  Procedure(s): ESOPHAGOGASTRODUODENOSCOPY (EGD) (N/A) SAVORY DILATION (N/A) as a surgical intervention .  The patient's history has been reviewed, patient examined, no change in status, stable for surgery.  I have reviewed the patient's chart and labs.  Questions were answered to the patient's satisfaction.     Yancey FlemingsJohn Yurika Pereda

## 2013-05-18 NOTE — H&P (View-Only) (Signed)
HISTORY OF PRESENT ILLNESS:  [name] is a 80 y.o. female who is known to me for management of chronic distal esophageal stricture with recurrent food impaction. She has been managed in recent years with regularly scheduled endoscopies and dilation. This has allowed her to avoid food impactions. She generally has procedure every 3-6 months. Her last upper endoscopy with dilation was performed March 2014. Followup in 3-4 months recommended. However, the following month, she fractured her ankle. She required operative repair which was delayed due to to the development of acute atrial fibrillation. Since her ankle surgery, she has been on Xarelto. Other issues include progressive dementia. As best we can tell, she has not had obvious difficulty with swallowing. She always denies dysphagia, however despite significant stricturing. She is accompanied today by her daughter, [name]  REVIEW OF SYSTEMS:  All non-GI ROS negative except for irregular heart rate, memory problems  Past Medical History  Diagnosis Date  . Arthritis   . GERD (gastroesophageal reflux disease)   . Hypertension   . Depression   . Hypercholesteremia   . Osteoporosis   . Anxiety   . Dyskinesia   . Dementia   . Esophageal stricture     Past Surgical History  Procedure Laterality Date  . Tonsillectomy    . Esophagogastroduodenoscopy      with dil, multiple times  . Esophagogastroduodenoscopy  06/26/2011    Procedure: ESOPHAGOGASTRODUODENOSCOPY (EGD);  Surgeon: [name], MD;  Location: [facility];  Service: Endoscopy;  Laterality: N/A;  with c-arm  . Savory dilation  06/26/2011    Procedure: SAVORY DILATION;  Surgeon: [name], MD;  Location: [facility];  Service: Endoscopy;  Laterality: N/A;  . Esophagogastroduodenoscopy  10/22/2011    Procedure: ESOPHAGOGASTRODUODENOSCOPY (EGD);  Surgeon: Hilarie Fredrickson, MD;  Location: [facility];  Service: Endoscopy;  Laterality: N/A;  . Savory dilation  10/22/2011   Procedure: SAVORY DILATION;  Surgeon: Hilarie Fredrickson, MD;  Location: [facility];  Service: Endoscopy;  Laterality: N/A;  . Esophagogastroduodenoscopy  03/04/2012    Procedure: ESOPHAGOGASTRODUODENOSCOPY (EGD);  Surgeon: Hilarie Fredrickson, MD;  Location: [facility];  Service: Endoscopy;  Laterality: N/A;  . Savory dilation  03/04/2012    Procedure: SAVORY DILATION;  Surgeon: Hilarie Fredrickson, MD;  Location: [facility];  Service: Endoscopy;  Laterality: N/A;  . Esophagogastroduodenoscopy N/A 07/15/2012    Procedure: ESOPHAGOGASTRODUODENOSCOPY (EGD);  Surgeon: Hilarie Fredrickson, MD;  Location: [facility];  Service: Endoscopy;  Laterality: N/A;  . Savory dilation N/A 07/15/2012    Procedure: SAVORY DILATION;  Surgeon: Hilarie Fredrickson, MD;  Location: [facility];  Service: Endoscopy;  Laterality: N/A;  . Orif ankle fracture Right 08/21/2012    Procedure: OPEN REDUCTION INTERNAL FIXATION (ORIF) ANKLE FRACTURE;  Surgeon: Nilda Simmer, MD;  Location: [facility];  Service: Orthopedics;  Laterality: Right;    Social History Jeniece KAELEY VINJE  reports that she has never smoked. She has never used smokeless tobacco. She reports that she does not drink alcohol or use illicit drugs.  family history is not on file.  Allergies  Allergen Reactions  . Codeine Nausea And Vomiting       PHYSICAL EXAMINATION: Vital signs: BP 148/62  Pulse 80  Ht 5\' 3"  (1.6 m)  Wt 109 lb 6.4 oz (49.624 kg)  BMI 19.38 kg/m2 General: Thin, frail, but well-nourished, no acute distress HEENT: Sclerae are anicteric, conjunctiva pink. Oral mucosa intact Lungs: Clear Heart: Regular Abdomen: soft, nontender, nondistended, no obvious ascites, no  peritoneal signs, normal bowel sounds. No organomegaly. Extremities: No edema Psychiatric: alert and oriented x3. Cooperative   ASSESSMENT:  #1. History of significant esophageal stricturing, secondary to GERD, managed with periodic dilation. He presented today regarding repeat dilation. The  patient's daughter is interested in having this done to avoid recurrent food impaction as she has had in the past. She is currently stable with regards to her other medical problems. #2. History of atrial fibrillation, on Xarelto. Regular heart rate and rhythm today #3. Dementia  PLAN:  #1. Schedule upper endoscopy with esophageal dilation. This will be performed at the hospital. CRNA monitored propofol sedation. #2. Consult with the patient's cardiologist, Dr. Verdis PrimeHenry Smith, regarding the feasibility of holding Xarelto preprocedure #3. Continue PPI

## 2013-05-19 ENCOUNTER — Encounter (HOSPITAL_COMMUNITY): Payer: Self-pay | Admitting: Internal Medicine

## 2013-06-23 ENCOUNTER — Encounter: Payer: Self-pay | Admitting: *Deleted

## 2013-09-09 ENCOUNTER — Encounter: Payer: Self-pay | Admitting: Interventional Cardiology

## 2014-03-10 ENCOUNTER — Telehealth: Payer: Self-pay

## 2014-03-10 NOTE — Telephone Encounter (Signed)
Patient's daughter came to office to get samples of xarelto 15 mg patient in doughnut holes

## 2014-10-11 ENCOUNTER — Inpatient Hospital Stay (HOSPITAL_COMMUNITY)
Admission: EM | Admit: 2014-10-11 | Discharge: 2014-10-14 | DRG: 392 | Disposition: A | Discharge status: Home / Self Care | Payer: Medicare Other | Attending: Internal Medicine | Admitting: Internal Medicine

## 2014-10-11 ENCOUNTER — Encounter (HOSPITAL_COMMUNITY): Payer: Self-pay | Admitting: *Deleted

## 2014-10-11 ENCOUNTER — Telehealth: Payer: Self-pay | Admitting: Internal Medicine

## 2014-10-11 ENCOUNTER — Emergency Department (HOSPITAL_COMMUNITY): Payer: Medicare Other

## 2014-10-11 DIAGNOSIS — J449 Chronic obstructive pulmonary disease, unspecified: Secondary | ICD-10-CM | POA: Diagnosis present

## 2014-10-11 DIAGNOSIS — F419 Anxiety disorder, unspecified: Secondary | ICD-10-CM | POA: Diagnosis present

## 2014-10-11 DIAGNOSIS — F0390 Unspecified dementia without behavioral disturbance: Secondary | ICD-10-CM | POA: Diagnosis not present

## 2014-10-11 DIAGNOSIS — K210 Gastro-esophageal reflux disease with esophagitis: Secondary | ICD-10-CM

## 2014-10-11 DIAGNOSIS — N390 Urinary tract infection, site not specified: Secondary | ICD-10-CM | POA: Diagnosis present

## 2014-10-11 DIAGNOSIS — I10 Essential (primary) hypertension: Secondary | ICD-10-CM | POA: Diagnosis present

## 2014-10-11 DIAGNOSIS — R001 Bradycardia, unspecified: Secondary | ICD-10-CM | POA: Diagnosis present

## 2014-10-11 DIAGNOSIS — R1310 Dysphagia, unspecified: Secondary | ICD-10-CM

## 2014-10-11 DIAGNOSIS — I482 Chronic atrial fibrillation: Secondary | ICD-10-CM | POA: Diagnosis present

## 2014-10-11 DIAGNOSIS — T17308A Unspecified foreign body in larynx causing other injury, initial encounter: Secondary | ICD-10-CM

## 2014-10-11 DIAGNOSIS — T18128A Food in esophagus causing other injury, initial encounter: Secondary | ICD-10-CM

## 2014-10-11 DIAGNOSIS — Z79899 Other long term (current) drug therapy: Secondary | ICD-10-CM

## 2014-10-11 DIAGNOSIS — K222 Esophageal obstruction: Principal | ICD-10-CM | POA: Diagnosis present

## 2014-10-11 DIAGNOSIS — I4891 Unspecified atrial fibrillation: Secondary | ICD-10-CM | POA: Diagnosis not present

## 2014-10-11 DIAGNOSIS — K219 Gastro-esophageal reflux disease without esophagitis: Secondary | ICD-10-CM | POA: Diagnosis present

## 2014-10-11 DIAGNOSIS — E780 Pure hypercholesterolemia: Secondary | ICD-10-CM | POA: Diagnosis present

## 2014-10-11 DIAGNOSIS — K449 Diaphragmatic hernia without obstruction or gangrene: Secondary | ICD-10-CM | POA: Diagnosis present

## 2014-10-11 DIAGNOSIS — F329 Major depressive disorder, single episode, unspecified: Secondary | ICD-10-CM | POA: Diagnosis present

## 2014-10-11 DIAGNOSIS — Z7901 Long term (current) use of anticoagulants: Secondary | ICD-10-CM

## 2014-10-11 DIAGNOSIS — M810 Age-related osteoporosis without current pathological fracture: Secondary | ICD-10-CM | POA: Diagnosis present

## 2014-10-11 DIAGNOSIS — M1990 Unspecified osteoarthritis, unspecified site: Secondary | ICD-10-CM | POA: Diagnosis present

## 2014-10-11 DIAGNOSIS — G249 Dystonia, unspecified: Secondary | ICD-10-CM | POA: Diagnosis present

## 2014-10-11 LAB — BASIC METABOLIC PANEL
Anion gap: 8 (ref 5–15)
BUN: 13 mg/dL (ref 6–20)
CO2: 25 mmol/L (ref 22–32)
Calcium: 9.5 mg/dL (ref 8.9–10.3)
Chloride: 107 mmol/L (ref 101–111)
Creatinine, Ser: 1.11 mg/dL — ABNORMAL HIGH (ref 0.44–1.00)
GFR calc Af Amer: 50 mL/min — ABNORMAL LOW (ref >60)
GFR calc non Af Amer: 43 mL/min — ABNORMAL LOW (ref >60)
GLUCOSE: 115 mg/dL — AB (ref 65–99)
POTASSIUM: 4.2 mmol/L (ref 3.5–5.1)
Sodium: 140 mmol/L (ref 135–145)

## 2014-10-11 LAB — CBC WITH DIFFERENTIAL/PLATELET
BASOS PCT: 1 % (ref 0–1)
Basophils Absolute: 0 10*3/uL (ref 0.0–0.1)
EOS ABS: 0 10*3/uL (ref 0.0–0.7)
Eosinophils Relative: 0 % (ref 0–5)
HEMATOCRIT: 39 % (ref 36.0–46.0)
Hemoglobin: 13.2 g/dL (ref 12.0–15.0)
Lymphocytes Relative: 20 % (ref 12–46)
Lymphs Abs: 1.3 10*3/uL (ref 0.7–4.0)
MCH: 32.8 pg (ref 26.0–34.0)
MCHC: 33.8 g/dL (ref 30.0–36.0)
MCV: 97 fL (ref 78.0–100.0)
Monocytes Absolute: 0.5 10*3/uL (ref 0.1–1.0)
Monocytes Relative: 8 % (ref 3–12)
NEUTROS ABS: 4.6 10*3/uL (ref 1.7–7.7)
NEUTROS PCT: 71 % (ref 43–77)
Platelets: 254 10*3/uL (ref 150–400)
RBC: 4.02 MIL/uL (ref 3.87–5.11)
RDW: 13.1 % (ref 11.5–15.5)
WBC: 6.5 10*3/uL (ref 4.0–10.5)

## 2014-10-11 MED ORDER — STERILE WATER FOR INJECTION IJ SOLN
INTRAMUSCULAR | Status: AC
Start: 2014-10-11 — End: 2014-10-11
  Administered 2014-10-11: 10 mL
  Filled 2014-10-11: qty 10

## 2014-10-11 MED ORDER — GLUCAGON HCL RDNA (DIAGNOSTIC) 1 MG IJ SOLR
1.0000 mg | Freq: Once | INTRAMUSCULAR | Status: AC
  Administered 2014-10-11: 1 mg via INTRAVENOUS
  Filled 2014-10-11: qty 1

## 2014-10-11 MED ORDER — PANTOPRAZOLE SODIUM 40 MG IV SOLR
40.0000 mg | Freq: Every day | INTRAVENOUS | Status: DC
  Administered 2014-10-11: 40 mg via INTRAVENOUS
  Filled 2014-10-11: qty 40

## 2014-10-11 MED ORDER — ONDANSETRON HCL 4 MG/2ML IJ SOLN: 4.0000 mg | Freq: Four times a day (QID) | INTRAMUSCULAR | Status: DC | PRN

## 2014-10-11 MED ORDER — HEPARIN SODIUM (PORCINE) 5000 UNIT/ML IJ SOLN
5000.0000 [IU] | Freq: Once | INTRAMUSCULAR | Status: AC
  Administered 2014-10-11: 5000 [IU] via SUBCUTANEOUS
  Filled 2014-10-11: qty 1

## 2014-10-11 MED ORDER — SODIUM CHLORIDE 0.9 % IV SOLN
INTRAVENOUS | Status: DC
  Administered 2014-10-11 – 2014-10-13 (×3): via INTRAVENOUS

## 2014-10-11 MED ORDER — METOPROLOL TARTRATE 1 MG/ML IV SOLN
2.5000 mg | Freq: Four times a day (QID) | INTRAVENOUS | Status: DC
  Administered 2014-10-11 – 2014-10-12 (×3): 2.5 mg via INTRAVENOUS
  Filled 2014-10-11 (×3): qty 5

## 2014-10-11 MED ORDER — DILTIAZEM HCL 25 MG/5ML IV SOLN
10.0000 mg | Freq: Four times a day (QID) | INTRAVENOUS | Status: DC | PRN
  Filled 2014-10-11: qty 5

## 2014-10-11 MED ORDER — SODIUM CHLORIDE 0.9 % IJ SOLN
3.0000 mL | Freq: Two times a day (BID) | INTRAMUSCULAR | Status: DC
  Administered 2014-10-11 – 2014-10-14 (×3): 3 mL via INTRAVENOUS

## 2014-10-11 MED ORDER — ACETAMINOPHEN 650 MG RE SUPP: 650.0000 mg | Freq: Four times a day (QID) | RECTAL | Status: DC | PRN

## 2014-10-11 MED ORDER — LABETALOL HCL 5 MG/ML IV SOLN
10.0000 mg | Freq: Once | INTRAVENOUS | Status: AC
  Administered 2014-10-11: 10 mg via INTRAVENOUS
  Filled 2014-10-11: qty 4

## 2014-10-11 MED ORDER — ONDANSETRON HCL 4 MG PO TABS: 4.0000 mg | ORAL_TABLET | Freq: Four times a day (QID) | ORAL | Status: DC | PRN

## 2014-10-11 MED ORDER — ACETAMINOPHEN 325 MG PO TABS
650.0000 mg | ORAL_TABLET | Freq: Four times a day (QID) | ORAL | Status: DC | PRN
  Administered 2014-10-13 (×2): 650 mg via ORAL
  Filled 2014-10-11 (×2): qty 2

## 2014-10-11 MED ORDER — HYDRALAZINE HCL 20 MG/ML IJ SOLN
5.0000 mg | INTRAMUSCULAR | Status: DC | PRN
  Filled 2014-10-11: qty 1

## 2014-10-11 NOTE — ED Provider Notes (Signed)
CSN: [id number]     Arrival date & time 10/11/14  1403 History   First MD Initiated Contact with Patient 10/11/14 1809     Chief Complaint  Patient presents with  . Dysphagia     (Consider location/radiation/quality/duration/timing/severity/associated sxs/prior Treatment) HPI Comments: Patient presents to the emergency department for evaluation of food stuck in her esophagus. Patient reports that she was eating lunch today around 12:30 when she swallowed and felt like the food got caught in her throat. It has felt like it has been caught ever since. Patient has not been able to eat or drink after this occurred. Family reports that she keeps spitting up all her sputum because she cannot swallow it. Patient has a history of esophageal strictures with numerous dilatations in the past. She has not had a procedure done in over a year and generally she has had it every year in the past. She did choke when she swallowed the food, but is not short of breath.    Past Medical History  Diagnosis Date  . Arthritis   . GERD (gastroesophageal reflux disease)   . Hypertension   . Depression   . Hypercholesteremia   . Osteoporosis   . Anxiety   . Dyskinesia   . Dementia   . Esophageal stricture   . Dysrhythmia     hx. Paroxsymal A. Fib   Past Surgical History  Procedure Laterality Date  . Tonsillectomy    . Esophagogastroduodenoscopy      with dil, multiple times  . Esophagogastroduodenoscopy  06/26/2011    Procedure: ESOPHAGOGASTRODUODENOSCOPY (EGD);  Surgeon: [name], MD;  Location: [facility];  Service: Endoscopy;  Laterality: N/A;  with c-arm  . Savory dilation  06/26/2011    Procedure: SAVORY DILATION;  Surgeon: [name], MD;  Location: [facility];  Service: Endoscopy;  Laterality: N/A;  . Esophagogastroduodenoscopy  10/22/2011    Procedure: ESOPHAGOGASTRODUODENOSCOPY (EGD);  Surgeon: Hilarie Fredrickson, MD;  Location: [facility];  Service: Endoscopy;  Laterality: N/A;  . Savory dilation   10/22/2011    Procedure: SAVORY DILATION;  Surgeon: Hilarie Fredrickson, MD;  Location: [facility];  Service: Endoscopy;  Laterality: N/A;  . Esophagogastroduodenoscopy  03/04/2012    Procedure: ESOPHAGOGASTRODUODENOSCOPY (EGD);  Surgeon: Hilarie Fredrickson, MD;  Location: [facility];  Service: Endoscopy;  Laterality: N/A;  . Savory dilation  03/04/2012    Procedure: SAVORY DILATION;  Surgeon: Hilarie Fredrickson, MD;  Location: [facility];  Service: Endoscopy;  Laterality: N/A;  . Esophagogastroduodenoscopy N/A 07/15/2012    Procedure: ESOPHAGOGASTRODUODENOSCOPY (EGD);  Surgeon: Hilarie Fredrickson, MD;  Location: [facility];  Service: Endoscopy;  Laterality: N/A;  . Savory dilation N/A 07/15/2012    Procedure: SAVORY DILATION;  Surgeon: Hilarie Fredrickson, MD;  Location: [facility];  Service: Endoscopy;  Laterality: N/A;  . Orif ankle fracture Right 08/21/2012    Procedure: OPEN REDUCTION INTERNAL FIXATION (ORIF) ANKLE FRACTURE;  Surgeon: Nilda Simmer, MD;  Location: [facility];  Service: Orthopedics;  Laterality: Right;  . Cataract extraction, bilateral Bilateral   . Esophagogastroduodenoscopy N/A 05/18/2013    Procedure: ESOPHAGOGASTRODUODENOSCOPY (EGD);  Surgeon: Hilarie Fredrickson, MD;  Location: [facility];  Service: Endoscopy;  Laterality: N/A;  . Savory dilation N/A 05/18/2013    Procedure: SAVORY DILATION;  Surgeon: Hilarie Fredrickson, MD;  Location: [facility];  Service: Endoscopy;  Laterality: N/A;   No family history on file. History  Substance Use Topics  . Smoking status: Never Smoker   . Smokeless  tobacco: Never Used  . Alcohol Use: No   OB History    No data available     Review of Systems  HENT: Positive for trouble swallowing.   Respiratory: Negative for shortness of breath.   All other systems reviewed and are negative.     Allergies  Codeine  Home Medications   Prior to Admission medications   Medication Sig Start Date End Date Taking? Authorizing Provider  diltiazem (CARDIZEM CD) 120 MG 24  hr capsule Take 120 mg by mouth daily with breakfast. 08/25/12  Yes Belkys A Regalado, MD  ferrous sulfate 325 (65 FE) MG tablet Take 325 mg by mouth daily with breakfast.   Yes Historical Provider, MD  metoprolol tartrate (LOPRESSOR) 25 MG tablet Take 12.5 mg by mouth 2 (two) times daily.    Yes Historical Provider, MD  mirtazapine (REMERON) 30 MG tablet Take 30 mg by mouth at bedtime.   Yes Historical Provider, MD  omeprazole (PRILOSEC) 20 MG capsule Take 20 mg by mouth every morning.    Yes Historical Provider, MD  Rivaroxaban (XARELTO) 15 MG TABS tablet Take 1 tablet (15 mg total) by mouth daily with supper. 08/25/12  Yes Belkys A Regalado, MD   BP 188/74 mmHg  Pulse 83  Temp(Src) 99 F (37.2 C) (Oral)  Resp 17  SpO2 100% Physical Exam  Constitutional: She is oriented to person, place, and time. She appears well-developed and well-nourished. No distress.  HENT:  Head: Normocephalic and atraumatic.  Right Ear: Hearing normal.  Left Ear: Hearing normal.  Nose: Nose normal.  Mouth/Throat: Oropharynx is clear and moist and mucous membranes are normal.  Eyes: Conjunctivae and EOM are normal. Pupils are equal, round, and reactive to light.  Neck: Normal range of motion. Neck supple.  Cardiovascular: Regular rhythm, S1 normal and S2 normal.  Exam reveals no gallop and no friction rub.   No murmur heard. Pulmonary/Chest: Effort normal and breath sounds normal. No respiratory distress. She exhibits no tenderness.  Abdominal: Soft. Normal appearance and bowel sounds are normal. There is no hepatosplenomegaly. There is no tenderness. There is no rebound, no guarding, no tenderness at McBurney's point and negative Murphy's sign. No hernia.  Musculoskeletal: Normal range of motion.  Neurological: She is alert and oriented to person, place, and time. She has normal strength. No cranial nerve deficit or sensory deficit. Coordination normal. GCS eye subscore is 4. GCS verbal subscore is 5. GCS motor  subscore is 6.  Skin: Skin is warm, dry and intact. No rash noted. No cyanosis.  Psychiatric: She has a normal mood and affect. Her speech is normal and behavior is normal. Thought content normal.  Nursing note and vitals reviewed.   ED Course  Procedures (including critical care time) Labs Review Labs Reviewed  BASIC METABOLIC PANEL - Abnormal; Notable for the following:    Glucose, Bld 115 (*)    Creatinine, Ser 1.11 (*)    GFR calc non Af Amer 43 (*)    GFR calc Af Amer 50 (*)    All other components within normal limits  CBC WITH DIFFERENTIAL/PLATELET    Imaging Review Dg Chest 2 View  10/11/2014   CLINICAL DATA:  Difficulty swallowing since 12:30 today. History of esophageal dilatation. Cough. Choking.  EXAM: CHEST  2 VIEW  COMPARISON:  08/18/2012  FINDINGS: Moderate-sized hiatal hernia, stable. Hyperinflation of the lungs. Heart is borderline enlarged. Biapical scarring. Lungs otherwise clear. No effusions. No acute bony abnormality.  IMPRESSION: Hyperinflation/COPD. Heart borderline  enlarged. Small to moderate hiatal hernia.   Electronically Signed   By: Charlett Nose M.D.   On: 10/11/2014 19:36     EKG Interpretation   Date/Time:  Monday October 11 2014 19:05:59 EDT Ventricular Rate:  107 PR Interval:    QRS Duration: 93 QT Interval:  366 QTC Calculation: 488 R Axis:   59 Text Interpretation:  Atrial fibrillation Ventricular premature complex  Low voltage, extremity leads Repol abnrm suggests ischemia, diffuse leads  No significant change since last tracing Confirmed by Kourtland Coopman  MD,  Shandrika Ambers [phone]) on 10/11/2014 8:08:09 PM      MDM   Final diagnoses:  Choking   esophageal impaction  Atrial fibrillation  Chronic anticoagulation  Presents to the emergency department for evaluation of food stuck in her esophagus. Patient has had previous esophageal impactions and required multiple dilatations in the past. Patient is not able to swallow her saliva at this time. I  did give her liquid to drink and she immediately regurgitated back up. Patient has had progressively worsening hypertension here in the ER. She does have a history of atrial fibrillation and is in atrial fibrillation and has had some rapid ventricular response. Patient treated with labetalol.  Patient discussed with Dr., on call for gastroenterology. She is a patient of [facility]. He confirms that the patient is not a candidate for emergent endoscopy tonight because of her Xarelto use. He recommended giving glucagon and obtaining barium swallow. This was discussed with radiology, she cannot get a barium swallow tonight because there is no radiologist here to read it.  Because of the patient's accelerating hypertension, slightly uncontrolled atrial fibrillation, inability take medications, inability to swallow, she will require hospitalization for further management. Dr. Arlyce Dice reports that she will be seen in the morning if she is admitted.    Gilda Crease, MD 10/11/14 2152

## 2014-10-11 NOTE — ED Notes (Signed)
Pt has had difficulty swallowing since 1230 today. Pt has esophageal dilatation performed yearly--pt has not had esophageal dilation in 1 year, 3 months. Pt's doctor told pt to come to ED for evaluation.

## 2014-10-11 NOTE — Telephone Encounter (Signed)
I attempted to return call, I was unable to reach the patient/caregiver and she does not have a working answering machine.  It rang approximately 10 times then asked that I enter "remote access code"  Dr. Marina Goodell, patient with dysphagia and choking episode last week.  She had her last dilation with you in January of 2015.  She is on xeralto and Dr. Katrinka Blazing allowed it to be help for 3 days last year.  Is ok of to set her up directly for a dilation or does she need office visit?  If ok for a direct I can send a letter to Dr. Katrinka Blazing to make sure that no changes in the last 18 months and still ok to hold xeralto. Please advise

## 2014-10-11 NOTE — ED Notes (Signed)
Patient transported to CT 

## 2014-10-11 NOTE — ED Notes (Signed)
MD at bedside. 

## 2014-10-11 NOTE — ED Notes (Signed)
ELK given to Dr. Blinda Leatherwood

## 2014-10-11 NOTE — H&P (Signed)
[facility] History and Physical  [name] HRC:[id number] DOB: April 30, 1922 DOA: 10/11/2014  Referring physician: Dr. Virl Cagey PCP: Connie Divine, MD   Chief Complaint: Food stuck in throat   HPI: [name] is a 81 y.o. female  Patient were single episode of getting food stuck in her throat earlier today. Patient was eating a stuffed pepper at lunch at 1231 and the food became lodged in her throat. Patient then began to vomit and cough in the food was extracted. Ever since this episode patient has had a full sensation in her throat and has been able to eat or drink or swallow her own saliva. Feels like the tightening is getting worse. Symptoms are constant. Nothing relieves her symptoms. Patient has a history of esophageal stricture with yearly dilations has not had her dilation performed  15 months ago Patient denies any chest pain, shortness of breath, fevers, nausea, vomiting, neck stiffness, headache, chest pain, palpitations.   Review of Systems:  Constitutional:  No weight loss, night sweats, Fevers, chills, fatigue.  HEENT: Per HPI Cardio-vascular:  No chest pain, Orthopnea, PND, swelling in lower extremities, anasarca, dizziness, palpitations  GI:  No heartburn, indigestion, abdominal pain, nausea, vomiting, diarrhea, change in bowel habits, loss of appetite  Resp:   No shortness of breath with exertion or at rest. No excess mucus, no productive cough, No non-productive cough, No coughing up of blood.No change in color of mucus.No wheezing.No chest wall deformity  Skin:  no rash or lesions.  GU:  no dysuria, change in color of urine, no urgency or frequency. No flank pain.  Musculoskeletal:   No joint pain or swelling. No decreased range of motion. No back pain.  Psych:  No change in mood or affect. No depression or anxiety. No memory loss.   Past Medical History  Diagnosis Date  . Arthritis   . GERD (gastroesophageal reflux disease)   .  Hypertension   . Depression   . Hypercholesteremia   . Osteoporosis   . Anxiety   . Dyskinesia   . Dementia   . Esophageal stricture   . Dysrhythmia     hx. Paroxsymal A. Fib   Past Surgical History  Procedure Laterality Date  . Tonsillectomy    . Esophagogastroduodenoscopy      with dil, multiple times  . Esophagogastroduodenoscopy  06/26/2011    Procedure: ESOPHAGOGASTRODUODENOSCOPY (EGD);  Surgeon: [name], MD;  Location: [facility];  Service: Endoscopy;  Laterality: N/A;  with c-arm  . Savory dilation  06/26/2011    Procedure: SAVORY DILATION;  Surgeon: [name], MD;  Location: [facility];  Service: Endoscopy;  Laterality: N/A;  . Esophagogastroduodenoscopy  10/22/2011    Procedure: ESOPHAGOGASTRODUODENOSCOPY (EGD);  Surgeon: Hilarie Fredrickson, MD;  Location: [facility];  Service: Endoscopy;  Laterality: N/A;  . Savory dilation  10/22/2011    Procedure: SAVORY DILATION;  Surgeon: Hilarie Fredrickson, MD;  Location: [facility];  Service: Endoscopy;  Laterality: N/A;  . Esophagogastroduodenoscopy  03/04/2012    Procedure: ESOPHAGOGASTRODUODENOSCOPY (EGD);  Surgeon: Hilarie Fredrickson, MD;  Location: [facility];  Service: Endoscopy;  Laterality: N/A;  . Savory dilation  03/04/2012    Procedure: SAVORY DILATION;  Surgeon: Hilarie Fredrickson, MD;  Location: [facility];  Service: Endoscopy;  Laterality: N/A;  . Esophagogastroduodenoscopy N/A 07/15/2012    Procedure: ESOPHAGOGASTRODUODENOSCOPY (EGD);  Surgeon: Hilarie Fredrickson, MD;  Location: [facility];  Service: Endoscopy;  Laterality: N/A;  . Savory dilation N/A 07/15/2012  Procedure: SAVORY DILATION;  Surgeon: Hilarie Fredrickson, MD;  Location: [facility];  Service: Endoscopy;  Laterality: N/A;  . Orif ankle fracture Right 08/21/2012    Procedure: OPEN REDUCTION INTERNAL FIXATION (ORIF) ANKLE FRACTURE;  Surgeon: Nilda Simmer, MD;  Location: [facility];  Service: Orthopedics;  Laterality: Right;  . Cataract extraction, bilateral Bilateral   .  Esophagogastroduodenoscopy N/A 05/18/2013    Procedure: ESOPHAGOGASTRODUODENOSCOPY (EGD);  Surgeon: Hilarie Fredrickson, MD;  Location: [facility];  Service: Endoscopy;  Laterality: N/A;  . Savory dilation N/A 05/18/2013    Procedure: SAVORY DILATION;  Surgeon: Hilarie Fredrickson, MD;  Location: [facility];  Service: Endoscopy;  Laterality: N/A;   Social History:  reports that she has never smoked. She has never used smokeless tobacco. She reports that she does not drink alcohol or use illicit drugs.  Allergies  Allergen Reactions  . Codeine Nausea And Vomiting    Family History  Problem Relation Age of Onset  . Family history unknown: Yes     Prior to Admission medications   Medication Sig Start Date End Date Taking? Authorizing Provider  diltiazem (CARDIZEM CD) 120 MG 24 hr capsule Take 120 mg by mouth daily with breakfast. 08/25/12  Yes Belkys A Regalado, MD  ferrous sulfate 325 (65 FE) MG tablet Take 325 mg by mouth daily with breakfast.   Yes Historical Provider, MD  metoprolol tartrate (LOPRESSOR) 25 MG tablet Take 12.5 mg by mouth 2 (two) times daily.    Yes Historical Provider, MD  mirtazapine (REMERON) 30 MG tablet Take 30 mg by mouth at bedtime.   Yes Historical Provider, MD  omeprazole (PRILOSEC) 20 MG capsule Take 20 mg by mouth every morning.    Yes Historical Provider, MD  Rivaroxaban (XARELTO) 15 MG TABS tablet Take 1 tablet (15 mg total) by mouth daily with supper. 08/25/12  Yes Alba Cory, MD   Physical Exam: Filed Vitals:   10/11/14 2108 10/11/14 2130 10/11/14 2246 10/11/14 2304  BP: 188/74 195/80 119/98 163/56  Pulse: 83 45    Temp:    98.6 F (37 C)  TempSrc:    Oral  Resp: Height:     (1.6 m)  Weight:    49.896 kg (110 lb)  SpO2: 100% 99% 98% 100%    Wt Readings from Last 3 Encounters:  10/11/14 49.896 kg (110 lb)  04/24/13 49.624 kg (109 lb 6.4 oz)  03/03/13 49.043 kg (108 lb 1.9 oz)    General:  Appears calm and comfortable Eyes:  PERRL,  normal lids, irises & conjunctiva ENT:  Unable to swallow oral secretions. Large shopping bag full of mucus and few food products Neck:  no LAD, masses or thyromegaly Cardiovascular: Irregularly irregular, 2/6 systolic murmur. No LE edema. Respiratory:  CTA bilaterally, no w/r/r. Normal respiratory effort. Abdomen:  soft, ntnd Skin:  no rash or induration seen on limited exam Musculoskeletal:  grossly normal tone BUE/BLE Psychiatric: Alert and oriented 3. grossly normal mood and affect, speech fluent and appropriate Neurologic:  grossly non-focal.          Labs on Admission:  Basic Metabolic Panel:  Recent Labs Lab 10/11/14 1908  NA 140  K 4.2  CL 107  CO2 25  GLUCOSE 115*  BUN 13  CREATININE 1.11*  CALCIUM 9.5   Liver Function Tests: No results for input(s): AST, ALT, ALKPHOS, BILITOT, PROT, ALBUMIN in the last 168 hours. No results for input(s): LIPASE, AMYLASE in  the last 168 hours. No results for input(s): AMMONIA in the last 168 hours. CBC:  Recent Labs Lab 10/11/14 1908  WBC 6.5  NEUTROABS 4.6  HGB 13.2  HCT 39.0  MCV 97.0  PLT 254   Cardiac Enzymes: No results for input(s): CKTOTAL, CKMB, CKMBINDEX, TROPONINI in the last 168 hours.  BNP (last 3 results) No results for input(s): BNP in the last 8760 hours.  ProBNP (last 3 results) No results for input(s): PROBNP in the last 8760 hours.  CBG: No results for input(s): GLUCAP in the last 168 hours.  Radiological Exams on Admission: Dg Chest 2 View  10/11/2014   CLINICAL DATA:  Difficulty swallowing since 12:30 today. History of esophageal dilatation. Cough. Choking.  EXAM: CHEST  2 VIEW  COMPARISON:  08/18/2012  FINDINGS: Moderate-sized hiatal hernia, stable. Hyperinflation of the lungs. Heart is borderline enlarged. Biapical scarring. Lungs otherwise clear. No effusions. No acute bony abnormality.  IMPRESSION: Hyperinflation/COPD. Heart borderline enlarged. Small to moderate hiatal hernia.    Electronically Signed   By: Charlett Nose M.D.   On: 10/11/2014 19:36    EKG: Independently reviewed. Afib  Assessment/Plan Principal Problem:   Dysphagia Active Problems:   ESOPHAGEAL STRICTURE   GERD (gastroesophageal reflux disease)   Atrial fibrillation   Esophageal obstruction due to food impaction   Essential hypertension   Dementia   Dysphagia: Chronic ongoing issue for patient to continue to esophageal stricture. Typically receives yearly esophageal dilation but has not had this done in 15 months. GI doctor is Dr. [name] (pts neighbor). IV glucagon given in ED and multiple attempts were tried to have the patient swallow without success. Per consultation with Dr. Arlyce Dice by the ED patient will be admitted for further management and likely dilation as inpatient.  - Telemetry  - Hold Xarelto (last dose 20:00 on 10/10/2014 ), IV Hep 5000 units x1 at 23:30 - Nothing by mouth  - Follow-up GI recommendations (Dr Arlyce Dice or Dr. Marina Goodell) -   Afib: chronic. On diltiazem and metoprolol. - IV diltiazem 10 mg every 6 when necessary heart rate greater than 110 - IV Lopressor 2.5 mg every 6  HTN: elevated at time of admission. Pt very anxious and has not had evening dose of bblocker - Continue Home dilt and Metop - When necessary hydralazine SBP greater than 190  Dementia: at baseline. Mild - monitor  GERD: - IV Protonix (home Prilosec  PO)  Depression: - Hold home around until completion of esophageal dilation   Code Status: FULL DVT Prophylaxis: Hep/SCD Family Communication: Daughter and son in law Disposition Plan: pending improvement  Tiana Sivertson Shela Commons, MD Family Medicine [facility] [email or web address] Password [email or web address]

## 2014-10-12 ENCOUNTER — Encounter (HOSPITAL_COMMUNITY): Payer: Self-pay

## 2014-10-12 ENCOUNTER — Encounter (HOSPITAL_COMMUNITY)
Admission: EM | Disposition: A | Discharge status: Home / Self Care | Payer: Self-pay | Source: Home / Self Care | Attending: Internal Medicine

## 2014-10-12 DIAGNOSIS — F0390 Unspecified dementia without behavioral disturbance: Secondary | ICD-10-CM | POA: Diagnosis not present

## 2014-10-12 DIAGNOSIS — K219 Gastro-esophageal reflux disease without esophagitis: Secondary | ICD-10-CM | POA: Diagnosis present

## 2014-10-12 DIAGNOSIS — T18128A Food in esophagus causing other injury, initial encounter: Secondary | ICD-10-CM | POA: Diagnosis not present

## 2014-10-12 DIAGNOSIS — K449 Diaphragmatic hernia without obstruction or gangrene: Secondary | ICD-10-CM | POA: Diagnosis present

## 2014-10-12 DIAGNOSIS — R1310 Dysphagia, unspecified: Secondary | ICD-10-CM | POA: Diagnosis present

## 2014-10-12 DIAGNOSIS — N390 Urinary tract infection, site not specified: Secondary | ICD-10-CM | POA: Diagnosis present

## 2014-10-12 DIAGNOSIS — Z79899 Other long term (current) drug therapy: Secondary | ICD-10-CM | POA: Diagnosis not present

## 2014-10-12 DIAGNOSIS — J449 Chronic obstructive pulmonary disease, unspecified: Secondary | ICD-10-CM | POA: Diagnosis present

## 2014-10-12 DIAGNOSIS — K222 Esophageal obstruction: Principal | ICD-10-CM

## 2014-10-12 DIAGNOSIS — Z7901 Long term (current) use of anticoagulants: Secondary | ICD-10-CM | POA: Diagnosis not present

## 2014-10-12 DIAGNOSIS — M1990 Unspecified osteoarthritis, unspecified site: Secondary | ICD-10-CM | POA: Diagnosis present

## 2014-10-12 DIAGNOSIS — E780 Pure hypercholesterolemia: Secondary | ICD-10-CM | POA: Diagnosis present

## 2014-10-12 DIAGNOSIS — R001 Bradycardia, unspecified: Secondary | ICD-10-CM | POA: Diagnosis present

## 2014-10-12 DIAGNOSIS — F419 Anxiety disorder, unspecified: Secondary | ICD-10-CM | POA: Diagnosis present

## 2014-10-12 DIAGNOSIS — M810 Age-related osteoporosis without current pathological fracture: Secondary | ICD-10-CM | POA: Diagnosis present

## 2014-10-12 DIAGNOSIS — I10 Essential (primary) hypertension: Secondary | ICD-10-CM | POA: Diagnosis not present

## 2014-10-12 DIAGNOSIS — I482 Chronic atrial fibrillation: Secondary | ICD-10-CM | POA: Diagnosis not present

## 2014-10-12 DIAGNOSIS — F329 Major depressive disorder, single episode, unspecified: Secondary | ICD-10-CM | POA: Diagnosis present

## 2014-10-12 DIAGNOSIS — G249 Dystonia, unspecified: Secondary | ICD-10-CM | POA: Diagnosis present

## 2014-10-12 HISTORY — PX: ESOPHAGOGASTRODUODENOSCOPY: SHX5428

## 2014-10-12 SURGERY — EGD (ESOPHAGOGASTRODUODENOSCOPY)
Anesthesia: Moderate Sedation

## 2014-10-12 MED ORDER — MIDAZOLAM HCL 10 MG/2ML IJ SOLN
INTRAMUSCULAR | Status: DC | PRN
  Administered 2014-10-12: 1 mg via INTRAVENOUS
  Administered 2014-10-12: 2 mg via INTRAVENOUS

## 2014-10-12 MED ORDER — DILTIAZEM HCL ER COATED BEADS 120 MG PO CP24
120.0000 mg | ORAL_CAPSULE | Freq: Every day | ORAL | Status: DC
  Administered 2014-10-12 – 2014-10-13 (×2): 120 mg via ORAL
  Filled 2014-10-12 (×3): qty 1

## 2014-10-12 MED ORDER — FENTANYL CITRATE (PF) 100 MCG/2ML IJ SOLN
INTRAMUSCULAR | Status: DC | PRN
  Administered 2014-10-12: 25 ug via INTRAVENOUS

## 2014-10-12 MED ORDER — MIDAZOLAM HCL 5 MG/ML IJ SOLN
INTRAMUSCULAR | Status: AC
  Filled 2014-10-12: qty 1

## 2014-10-12 MED ORDER — BUTAMBEN-TETRACAINE-BENZOCAINE 2-2-14 % EX AERO
INHALATION_SPRAY | CUTANEOUS | Status: DC | PRN
  Administered 2014-10-12: 2 via TOPICAL

## 2014-10-12 MED ORDER — DIPHENHYDRAMINE HCL 50 MG/ML IJ SOLN
INTRAMUSCULAR | Status: AC
  Filled 2014-10-12: qty 1

## 2014-10-12 MED ORDER — METOPROLOL TARTRATE 25 MG PO TABS
12.5000 mg | ORAL_TABLET | Freq: Two times a day (BID) | ORAL | Status: DC
  Administered 2014-10-12 – 2014-10-13 (×3): 12.5 mg via ORAL
  Filled 2014-10-12 (×4): qty 1

## 2014-10-12 MED ORDER — MIRTAZAPINE 15 MG PO TABS
30.0000 mg | ORAL_TABLET | Freq: Every day | ORAL | Status: DC
  Administered 2014-10-12 – 2014-10-13 (×2): 30 mg via ORAL
  Filled 2014-10-12 (×2): qty 2

## 2014-10-12 MED ORDER — FENTANYL CITRATE (PF) 100 MCG/2ML IJ SOLN
INTRAMUSCULAR | Status: AC
  Filled 2014-10-12: qty 2

## 2014-10-12 MED ORDER — PANTOPRAZOLE SODIUM 40 MG PO TBEC
40.0000 mg | DELAYED_RELEASE_TABLET | Freq: Two times a day (BID) | ORAL | Status: DC
  Administered 2014-10-12 – 2014-10-14 (×4): 40 mg via ORAL
  Filled 2014-10-12 (×4): qty 1

## 2014-10-12 NOTE — Telephone Encounter (Signed)
I spoke with patient's son-in-law this morning. She went to the hospital (still there) with a food impaction. Follow-up to be determined, thank you.

## 2014-10-12 NOTE — Progress Notes (Signed)
[facility] PROGRESS NOTE  [name] XBJ:[id number] DOB: [birth year] DOA: 10/11/2014 PCP: Astrid Divine, MD  Assessment/Plan: #1 dysphagia likely secondary to esophageal stricture Chronic in nature secondary to esophageal stricture. Patient usually gets yearly esophageal dilatation but had not had one done in approximately 15 months. Patient was given IV glucagon in the ED multiple attempts were done to have patient's swallow without success. Patient anticoagulation of Xarelto is on hold. Patient being seen by GI and patient. Endoscopy today for further evaluation and management. Per GI.  #2 chronic atrial fibrillation Was on oral diltiazem and metoprolol for rate control however secondary to dysphagia patient currently on IV Lopressor for rate control. Anticoagulation on hold secondary to procedure to be done today. GI to recommend when anticoagulation can be resumed.  #3 hypertension Continue  Lopressor and diltiazem.  #4 dementia Stable.  #5 gastroesophageal reflux disease PPI.  #6 depression Medications on hold secondary to problem #1. When patient and placed on a diet will resume her home regimen.  #7 prophylaxis SCDs for DVT prophylaxis.  Code Status: Full Family Communication: Updated patient and son-in-law at bedside. Disposition Plan: Remain inpatient.   Consultants:  Gastroenterology: Dr. Leone Payor 10/12/2014  Procedures:  Chest x-ray 10/11/2014  Antibiotics:  None  HPI/Subjective: Patient with no complaints. Laying in bed.  Objective: Filed Vitals:   10/12/14 0726  BP: 158/69  Pulse:   Temp:   Resp:     Intake/Output Summary (Last 24 hours) at 10/12/14 1140 Last data filed at 10/12/14 0600  Gross per 24 hour  Intake 453.75 ml  Output      0 ml  Net 453.75 ml   Filed Weights   10/11/14 2304  Weight: 49.896 kg (110 lb)    Exam:   General:  NAD  Cardiovascular: RRR  Respiratory: CTAB  Abdomen: Soft, nontender,  nondistended, positive bowel sounds.  Musculoskeletal: No clubbing cyanosis or edema.  Data Reviewed: Basic Metabolic Panel:  Recent Labs Lab 10/11/14 1908  NA 140  K 4.2  CL 107  CO2 25  GLUCOSE 115*  BUN 13  CREATININE 1.11*  CALCIUM 9.5   Liver Function Tests: No results for input(s): AST, ALT, ALKPHOS, BILITOT, PROT, ALBUMIN in the last 168 hours. No results for input(s): LIPASE, AMYLASE in the last 168 hours. No results for input(s): AMMONIA in the last 168 hours. CBC:  Recent Labs Lab 10/11/14 1908  WBC 6.5  NEUTROABS 4.6  HGB 13.2  HCT 39.0  MCV 97.0  PLT 254   Cardiac Enzymes: No results for input(s): CKTOTAL, CKMB, CKMBINDEX, TROPONINI in the last 168 hours. BNP (last 3 results) No results for input(s): BNP in the last 8760 hours.  ProBNP (last 3 results) No results for input(s): PROBNP in the last 8760 hours.  CBG: No results for input(s): GLUCAP in the last 168 hours.  No results found for this or any previous visit (from the past 240 hour(s)).   Studies: Dg Chest 2 View  10/11/2014   CLINICAL DATA:  Difficulty swallowing since 12:30 today. History of esophageal dilatation. Cough. Choking.  EXAM: CHEST  2 VIEW  COMPARISON:  08/18/2012  FINDINGS: Moderate-sized hiatal hernia, stable. Hyperinflation of the lungs. Heart is borderline enlarged. Biapical scarring. Lungs otherwise clear. No effusions. No acute bony abnormality.  IMPRESSION: Hyperinflation/COPD. Heart borderline enlarged. Small to moderate hiatal hernia.   Electronically Signed   By: Charlett Nose M.D.   On: 10/11/2014 19:36    Scheduled Meds: . metoprolol  2.5 mg  Intravenous 4 times per day  . pantoprazole (PROTONIX) IV  40 mg Intravenous QHS  . sodium chloride  3 mL Intravenous Q12H   Continuous Infusions: . sodium chloride 75 mL/hr at 10/11/14 2357    Principal Problem:   Dysphagia Active Problems:   ESOPHAGEAL STRICTURE   GERD (gastroesophageal reflux disease)   Atrial  fibrillation   Esophageal obstruction due to food impaction   Essential hypertension   Dementia    Time spent: 35 minutes    [facility] MD [facility] Pager [phone]. If 7PM-7AM, please contact night-coverage at [email or web address], password [facility] 10/12/2014, 11:40 AM

## 2014-10-12 NOTE — Consult Note (Signed)
Consultation  Referring Provider:    [facility]  Primary Care Physician:  Astrid Divine, MD Primary Gastroenterologist:     [name], MD    Reason for Consultation:    Food impaction          HPI:   [name] is a 81 y.o. female known to [place] for a history of chronic distal esophageal strictures with recurrent food impactions.  She has required regularly scheduled EGDs with dilation in recent years. Last EGD January 2015 revealed a distal esophageal stricture, s/p savary dilation. Stomach revealed changes of GAVE.Patient presented to ED this am with recurrent swallowing difficulties. Recurrent dysphagia has been of gradual onset but yesterday patient was unable to swallow any of her lunch. The food came back up, she doesn't feel like any food is lodged in esophagus. She does have sensation of phlegm in her throat and was unable to swallow ginger ale last night. No odynophagia. No other GI comp[laints.    Past Medical History  Diagnosis Date  . Arthritis   . GERD (gastroesophageal reflux disease)   . Hypertension   . Depression   . Hypercholesteremia   . Osteoporosis   . Anxiety   . Dyskinesia   . Dementia   . Esophageal stricture   . Dysrhythmia     hx. Paroxsymal A. Fib    Past Surgical History  Procedure Laterality Date  . Tonsillectomy    . Esophagogastroduodenoscopy      with dil, multiple times  . Esophagogastroduodenoscopy  06/26/2011    Procedure: ESOPHAGOGASTRODUODENOSCOPY (EGD);  Surgeon: [name], MD;  Location: [facility];  Service: Endoscopy;  Laterality: N/A;  with c-arm  . Savory dilation  06/26/2011    Procedure: SAVORY DILATION;  Surgeon: [name], MD;  Location: [facility];  Service: Endoscopy;  Laterality: N/A;  . Esophagogastroduodenoscopy  10/22/2011    Procedure: ESOPHAGOGASTRODUODENOSCOPY (EGD);  Surgeon: Hilarie Fredrickson, MD;  Location: [facility];  Service: Endoscopy;  Laterality: N/A;  . Savory dilation  10/22/2011   Procedure: SAVORY DILATION;  Surgeon: Hilarie Fredrickson, MD;  Location: [facility];  Service: Endoscopy;  Laterality: N/A;  . Esophagogastroduodenoscopy  03/04/2012    Procedure: ESOPHAGOGASTRODUODENOSCOPY (EGD);  Surgeon: Hilarie Fredrickson, MD;  Location: [facility];  Service: Endoscopy;  Laterality: N/A;  . Savory dilation  03/04/2012    Procedure: SAVORY DILATION;  Surgeon: Hilarie Fredrickson, MD;  Location: [facility];  Service: Endoscopy;  Laterality: N/A;  . Esophagogastroduodenoscopy N/A 07/15/2012    Procedure: ESOPHAGOGASTRODUODENOSCOPY (EGD);  Surgeon: Hilarie Fredrickson, MD;  Location: [facility];  Service: Endoscopy;  Laterality: N/A;  . Savory dilation N/A 07/15/2012    Procedure: SAVORY DILATION;  Surgeon: Hilarie Fredrickson, MD;  Location: [facility];  Service: Endoscopy;  Laterality: N/A;  . Orif ankle fracture Right 08/21/2012    Procedure: OPEN REDUCTION INTERNAL FIXATION (ORIF) ANKLE FRACTURE;  Surgeon: Nilda Simmer, MD;  Location: [facility];  Service: Orthopedics;  Laterality: Right;  . Cataract extraction, bilateral Bilateral   . Esophagogastroduodenoscopy N/A 05/18/2013    Procedure: ESOPHAGOGASTRODUODENOSCOPY (EGD);  Surgeon: Hilarie Fredrickson, MD;  Location: [facility];  Service: Endoscopy;  Laterality: N/A;  . Savory dilation N/A 05/18/2013    Procedure: SAVORY DILATION;  Surgeon: Hilarie Fredrickson, MD;  Location: [facility];  Service: Endoscopy;  Laterality: N/A;    Family History  Problem Relation Age of Onset  . Family history unknown: Yes     History  Substance Use Topics  . Smoking status: Never Smoker   . Smokeless tobacco: Never Used  . Alcohol Use: No    Prior to Admission medications   Medication Sig Start Date End Date Taking? Authorizing Provider  diltiazem (CARDIZEM CD) 120 MG 24 hr capsule Take 120 mg by mouth daily with breakfast. 08/25/12  Yes Belkys A Regalado, MD  ferrous sulfate 325 (65 FE) MG tablet Take 325 mg by mouth daily with breakfast.   Yes Historical Provider, MD    metoprolol tartrate (LOPRESSOR) 25 MG tablet Take 12.5 mg by mouth 2 (two) times daily.    Yes Historical Provider, MD  mirtazapine (REMERON) 30 MG tablet Take 30 mg by mouth at bedtime.   Yes Historical Provider, MD  omeprazole (PRILOSEC) 20 MG capsule Take 20 mg by mouth every morning.    Yes Historical Provider, MD  Rivaroxaban (XARELTO) 15 MG TABS tablet Take 1 tablet (15 mg total) by mouth daily with supper. 08/25/12  Yes Alba Cory, MD    Current Facility-Administered Medications  Medication Dose Route Frequency Provider Last Rate Last Dose  . 0.9 %  sodium chloride infusion   Intravenous Continuous Ozella Rocks, MD 75 mL/hr at 10/11/14 2357    . acetaminophen (TYLENOL) tablet 650 mg  650 mg Oral Q6H PRN Ozella Rocks, MD       Or  . acetaminophen (TYLENOL) suppository 650 mg  650 mg Rectal Q6H PRN Ozella Rocks, MD      . diltiazem (CARDIZEM) injection 10 mg  10 mg Intravenous Q6H PRN Ozella Rocks, MD      . hydrALAZINE (APRESOLINE) injection 5-10 mg  5-10 mg Intravenous Q4H PRN Ozella Rocks, MD      . metoprolol (LOPRESSOR) injection 2.5 mg  2.5 mg Intravenous 4 times per day Ozella Rocks, MD   2.5 mg at 10/12/14 0641  . ondansetron (ZOFRAN) tablet 4 mg  4 mg Oral Q6H PRN Ozella Rocks, MD       Or  . ondansetron [facility]) injection 4 mg  4 mg Intravenous Q6H PRN Ozella Rocks, MD      . pantoprazole (PROTONIX) injection 40 mg  40 mg Intravenous QHS Ozella Rocks, MD   40 mg at 10/11/14 2357  . sodium chloride 0.9 % injection 3 mL  3 mL Intravenous Q12H Ozella Rocks, MD   3 mL at 10/11/14 2358    Allergies as of 10/11/2014 - Review Complete 10/11/2014  Allergen Reaction Noted  . Codeine Nausea And Vomiting 06/26/2011    Review of Systems:    As per HPI, otherwise negative    Physical Exam:  Vital signs in last 24 hours: Temp:  [98.6 F (37 C)-99 F (37.2 C)] 98.6 F (37 C) (06/21 0640) Pulse Rate:  [45-97] 45 (06/20 2130) Resp:  [14-23] 22  (06/20 2246) BP: (119-195)/(56-100) 158/69 mmHg (06/21 0726) SpO2:  [96 %-100 %] 96 % (06/21 0640) Weight:  [110 lb (49.896 kg)] 110 lb (49.896 kg) (06/20 2304) Last BM Date: 10/11/14 General:   Pleasant white female in NAD Head:  Normocephalic and atraumatic. Eyes:   No icterus.   Conjunctiva pink. Ears:  Normal auditory acuity. Neck:  Supple Lungs:  Respirations even and unlabored. Lungs clear to auscultation bilaterally.   No wheezes, crackles, or rhonchi.  Heart:  Regular rate and rhythm Abdomen:  Soft, nondistended, nontender. Normal bowel sounds. No appreciable masses or hepatomegaly.  Rectal:  Not performed.  Msk:  Symmetrical without gross deformities.  Extremities:  Without edema. Neurologic:  Alert and  oriented x4;  grossly normal neurologically. Skin:  Intact without significant lesions or rashes. Psych:  Alert and cooperative. Normal affect.  LAB RESULTS:  Recent Labs  10/11/14 1908  WBC 6.5  HGB 13.2  HCT 39.0  PLT 254   BMET  Recent Labs  10/11/14 1908  NA 140  K 4.2  CL 107  CO2 25  GLUCOSE 115*  BUN 13  CREATININE 1.11*  CALCIUM 9.5   STUDIES: Dg Chest 2 View  10/11/2014   CLINICAL DATA:  Difficulty swallowing since 12:30 today. History of esophageal dilatation. Cough. Choking.  EXAM: CHEST  2 VIEW  COMPARISON:  08/18/2012  FINDINGS: Moderate-sized hiatal hernia, stable. Hyperinflation of the lungs. Heart is borderline enlarged. Biapical scarring. Lungs otherwise clear. No effusions. No acute bony abnormality.  IMPRESSION: Hyperinflation/COPD. Heart borderline enlarged. Small to moderate hiatal hernia.   Electronically Signed   By: Charlett Nose M.D.   On: 10/11/2014 19:36    PREVIOUS ENDOSCOPIES:            Numerous EGDs with dilation.   Impression / Plan:    1. Pleasant 81 year old female with recurrent esophageal strictures / food impactions requiring dilation. Last EGD with dilation was January 2015 (distal esophageal stricture). Now with  recurrent dysphagia and possible food impaction. Patient needs repeat EGD to evaluate for food impaction. Ideally patient could undergo dilation at time of EGD but this will depend on endoscopic findings and length of time off Xarelto. Son-in-law believes last Xarelto dose was 8pm night before last. Fortunately Xarelto does have a short half life. Keep NPO for now.   2. COPD  Thanks     Willette Cluster  10/12/2014, 9:56 AM    [facility] Attending  I have also seen and assessed the patient and agree with the advanced practitioner's assessment and plan. My hx and PE same  The risks and benefits as well as alternatives of endoscopic procedure(s) have been discussed and reviewed. All questions answered. The patient and daughter agrees to proceed.   Plan for EGD and removal of any residual food and also stricture dilation.  Iva Boop, MD, [name] [phone] (pager) 10/12/2014 3:49 PM

## 2014-10-12 NOTE — Progress Notes (Signed)
Initial Nutrition Assessment  DOCUMENTATION CODES:  Not applicable  INTERVENTION: - Will order Magic Cup once/day for when diet is advanced for pt to trial - RD will continue to monitor for needs  NUTRITION DIAGNOSIS:  Inadequate oral intake related to inability to eat as evidenced by NPO status.  GOAL:  Patient will meet greater than or equal to 90% of their needs  MONITOR:  Diet advancement, PO intake, Supplement acceptance, Weight trends, Labs  REASON FOR ASSESSMENT:  Malnutrition Screening Tool  ASSESSMENT: 81 y.o. female with single episode of getting food stuck in her throat earlier today (6/20). Patient was eating a stuffed pepper at lunch at 1231 and the food became lodged in her throat. Patient then began to vomit and cough in the food was extracted. Ever since this episode patient has had a full sensation in her throat and has been able to eat or drink or swallow her own saliva. Feels like the tightening is getting worse. Symptoms are constant. Nothing relieves her symptoms. Patient has a history of esophageal stricture with yearly dilations has not had her dilation performed15 months ago.  Pt seen for MST. BMI indicates normal weight status. Pt sleeping at time of visit and daughter requests physical assessment not to be performed at this time.  Daughter reports all information. Pt has hx of dementia and lives alone. A caregiver visits pt on Mondays and Fridays and takes her out to lunch. Pt often eats cereal for breakfast but daughter unsure of other intakes throughout the day as pt often forgets if she ate or not. Pt's PCP recently gave her two bottles of Ensure to try but pt has not yet tried these. Talked with daughter about Magic Cup and also about mixing ice cream into Ensure to make a milkshake; she is very interested in [employer] and possibly buying them for home use if pt likes it.   Pt's appetite has decreased with age and daughter feels pt is losing weight but  is not sure of amount of weight that was lost. Limited weight hx available in chart so unable to determine.   Not able to meet needs with NPO status. Pt to go for esophageal dilation this afternoon.Medications reviewed. Labs reviewed; creatinine elevated, GFR: 43.  Height:  Ht Readings from Last 1 Encounters:  10/11/14 5\' 3"  (1.6 m)    Weight:  Wt Readings from Last 1 Encounters:  10/11/14 110 lb (49.896 kg)    Ideal Body Weight:  52.3 kg (kg)  Wt Readings from Last 10 Encounters:  10/11/14 110 lb (49.896 kg)  04/24/13 109 lb 6.4 oz (49.624 kg)  03/03/13 108 lb 1.9 oz (49.043 kg)  10/20/12 114 lb (51.71 kg)  08/21/12 120 lb 3.2 oz (54.522 kg)  08/10/12 113 lb (51.256 kg)  03/04/12 116 lb (52.617 kg)  10/22/11 116 lb (52.617 kg)  06/26/11 119 lb (53.978 kg)    BMI:  Body mass index is 19.49 kg/(m^2).  Estimated Nutritional Needs:  Kcal:  1100-1300  Protein:  50-60 grams  Fluid:  2 L/day  Skin:  Reviewed, no issues  Diet Order:  Diet NPO time specified  EDUCATION NEEDS:  No education needs identified at this time   Intake/Output Summary (Last 24 hours) at 10/12/14 1344 Last data filed at 10/12/14 0600  Gross per 24 hour  Intake 453.75 ml  Output      0 ml  Net 453.75 ml    Last BM:  PTA    Trenton Gammon,  RD, LDN Inpatient Clinical Dietitian Pager # [phone] After hours/weekend pager # [phone]

## 2014-10-12 NOTE — Op Note (Addendum)
[facility] [address] [place], [address]   ENDOSCOPY PROCEDURE REPORT  PATIENT: [name], [name]  MR#: [id number] BIRTHDATE: 1922-11-24 , 89  yrs. old GENDER: female ENDOSCOPIST: Iva Boop, MD, [facility] PROCEDURE DATE:  10/12/2014 PROCEDURE:  Esophagoscopy   + balloon dilation esophagus < 30 mm ASA CLASS:     Class III INDICATIONS:  therapeutic procedure.  dilate stricture MEDICATIONS: Fentanyl 25 mcg IV and Versed 3 mg IV TOPICAL ANESTHETIC: Cetacaine Spray  DESCRIPTION OF PROCEDURE: After the risks benefits and alternatives of the procedure were thoroughly explained, informed consent was obtained.  The Pentax Gastroscope [id number] endoscope was introduced through the mouth and advanced to the stomach antrum , Without limitations.  The instrument was slowly withdrawn as the mucosa was fully examined.    1) traumatized benign-appearing stricture at 40 cm - looks like food impaction had been there as suspected. 2) Tortuous esophagus and 5 cm hiatal hernia 3) Erythema in antrum otherwise normal stomach 4) Stricture dilated 12, 13.5 and 15 mm with balloon - 13.5 and 15 mm had some effect but fibrous stricture and no tear seen.  Small heme, minimal blood loss.  Retroflexed views revealed a hiatal hernia.     The scope was then withdrawn from the patient and the procedure completed.  COMPLICATIONS: There were no immediate complications.  ENDOSCOPIC IMPRESSION: 1) traumatized benign-appearing stricture at 40 cm - looks like food impaction had been there as suspected. 2) Tortuous esophagus and 5 cm hiatal hernia 3) Erythema in antrum otherwise normal stomach 4) Stricture dilated 12, 13.5 and 15 mm with balloon - 13.5 and 15 mm had some effect but fibrous stricture and no tear seen.  Small heme, minimal blood loss  RECOMMENDATIONS: Dysphagia 3 diet restart Xarelto tomorrow Restart oral BP/cardiac meds today home tomorrow if ok should have another  dilation in 1-3 weeks I think - with Dr.  Marina Goodell  high dose PPI bid before breakfast and supper (eg omeprazole 40 or pantoprazole 40)    eSigned:  Iva Boop, MD, [facility] 10/12/2014 4:30 PM Revised: 10/12/2014 4:30 PM   CC: [name], MD, Maurice Small, MD and The Patient

## 2014-10-13 ENCOUNTER — Encounter (HOSPITAL_COMMUNITY): Payer: Self-pay | Admitting: Internal Medicine

## 2014-10-13 DIAGNOSIS — I10 Essential (primary) hypertension: Secondary | ICD-10-CM

## 2014-10-13 DIAGNOSIS — F0390 Unspecified dementia without behavioral disturbance: Secondary | ICD-10-CM

## 2014-10-13 DIAGNOSIS — I482 Chronic atrial fibrillation: Secondary | ICD-10-CM

## 2014-10-13 DIAGNOSIS — N10 Acute tubulo-interstitial nephritis: Secondary | ICD-10-CM

## 2014-10-13 LAB — URINALYSIS, ROUTINE W REFLEX MICROSCOPIC
Bilirubin Urine: NEGATIVE
Glucose, UA: NEGATIVE mg/dL
Ketones, ur: 40 mg/dL — AB
NITRITE: NEGATIVE
PH: 6 (ref 5.0–8.0)
Protein, ur: NEGATIVE mg/dL
SPECIFIC GRAVITY, URINE: 1.015 (ref 1.005–1.030)
Urobilinogen, UA: 1 mg/dL (ref 0.0–1.0)

## 2014-10-13 LAB — BASIC METABOLIC PANEL
Anion gap: 6 (ref 5–15)
BUN: 13 mg/dL (ref 6–20)
CO2: 21 mmol/L — ABNORMAL LOW (ref 22–32)
Calcium: 8.3 mg/dL — ABNORMAL LOW (ref 8.9–10.3)
Chloride: 108 mmol/L (ref 101–111)
Creatinine, Ser: 1.01 mg/dL — ABNORMAL HIGH (ref 0.44–1.00)
GFR, EST AFRICAN AMERICAN: 55 mL/min — AB (ref >60)
GFR, EST NON AFRICAN AMERICAN: 48 mL/min — AB (ref >60)
Glucose, Bld: 110 mg/dL — ABNORMAL HIGH (ref 65–99)
Potassium: 3.6 mmol/L (ref 3.5–5.1)
SODIUM: 135 mmol/L (ref 135–145)

## 2014-10-13 LAB — CBC
HCT: 32.4 % — ABNORMAL LOW (ref 36.0–46.0)
Hemoglobin: 10.8 g/dL — ABNORMAL LOW (ref 12.0–15.0)
MCH: 32.4 pg (ref 26.0–34.0)
MCHC: 33.3 g/dL (ref 30.0–36.0)
MCV: 97.3 fL (ref 78.0–100.0)
Platelets: 205 10*3/uL (ref 150–400)
RBC: 3.33 MIL/uL — ABNORMAL LOW (ref 3.87–5.11)
RDW: 13.2 % (ref 11.5–15.5)
WBC: 8.5 10*3/uL (ref 4.0–10.5)

## 2014-10-13 LAB — URINE MICROSCOPIC-ADD ON

## 2014-10-13 MED ORDER — CEFTRIAXONE SODIUM IN DEXTROSE 20 MG/ML IV SOLN
1.0000 g | INTRAVENOUS | Status: DC
  Administered 2014-10-13 – 2014-10-14 (×2): 1 g via INTRAVENOUS
  Filled 2014-10-13 (×2): qty 50

## 2014-10-13 NOTE — Evaluation (Signed)
Physical Therapy Evaluation Patient Details Name: [name] MRN: [id number] DOB: Jul 11, 1922 Today's Date: 10/13/2014   History of Present Illness  81 yo female admitted with dysphagia. Hx of Afib, dementia, HTn, esophageal stricture. Pt lives alone with caregiver 2x/week.   Clinical Impression  On eval, pt was Min guard assist for mobility-able to ambulate ~60 feet with straight cane. No LOB but pt does have some general weakness. Daughter present-states pt may go home with her for a day or so. Feel this is a good plan to ensure pt is functioning well before returning home alone. Recommend daily ambulation with nursing supervision to increase activity.     Follow Up Recommendations No PT follow up;Supervision - Intermittent ( may benefit from staying with daughter for a day or two before going home alone)    Equipment Recommendations  None recommended by PT    Recommendations for Other Services Speech consult     Precautions / Restrictions Precautions Precautions: None Restrictions Weight Bearing Restrictions: No      Mobility  Bed Mobility Overal bed mobility: Needs Assistance Bed Mobility: Supine to Sit;Sit to Supine     Supine to sit: Supervision;HOB elevated Sit to supine: Supervision;HOB elevated      Transfers Overall transfer level: Needs assistance   Transfers: Sit to/from Stand Sit to Stand: Supervision            Ambulation/Gait Ambulation/Gait assistance: Min guard Ambulation Distance (Feet): 60 Feet Assistive device: Straight cane Gait Pattern/deviations: Step-through pattern     General Gait Details: slow gait speed. no LOB. close guard for safety  Stairs            Wheelchair Mobility    Modified Rankin (Stroke Patients Only)       Balance Overall balance assessment: Needs assistance         Standing balance support: Single extremity supported;During functional activity Standing balance-Leahy Scale: Good                                Pertinent Vitals/Pain Pain Assessment: No/denies pain    Home Living Family/patient expects to be discharged to:: Private residence Living Arrangements: Alone Available Help at Discharge: Personal care attendant (2x/week) Type of Home: House Home Access: Ramped entrance     Home Layout: One level Home Equipment: Cane - single point;Walker - 2 wheels      Prior Function Level of Independence: Independent with assistive device(s)         Comments: uses cane     Hand Dominance        Extremity/Trunk Assessment   Upper Extremity Assessment: Generalized weakness           Lower Extremity Assessment: Generalized weakness      Cervical / Trunk Assessment: Kyphotic  Communication   Communication: No difficulties  Cognition Arousal/Alertness: Awake/[name] Behavior During Therapy: WFL for tasks assessed/performed Overall Cognitive Status: History of cognitive impairments - at baseline                      General Comments      Exercises        Assessment/Plan    PT Assessment Patient needs continued PT services  PT Diagnosis Difficulty walking;Generalized weakness   PT Problem List Decreased strength;Decreased activity tolerance;Decreased mobility  PT Treatment Interventions DME instruction;Gait training;Functional mobility training;Therapeutic activities;Therapeutic exercise;Patient/family education   PT Goals (Current goals can be found in  the Care Plan section) Acute Rehab PT Goals Patient Stated Goal: for pt to return home PT Goal Formulation: With patient/family Time For Goal Achievement: 10/27/14    Frequency Min 3X/week   Barriers to discharge        Co-evaluation               End of Session Equipment Utilized During Treatment: Gait belt Activity Tolerance: Patient tolerated treatment well Patient left: in chair;with call bell/phone within reach;with chair alarm set;with family/visitor present            Time: 1145-1200 PT Time Calculation (min) (ACUTE ONLY): 15 min   Charges:   PT Evaluation $Initial PT Evaluation Tier I: 1 Procedure     PT G Codes:        [name], MPT Pager: [phone]

## 2014-10-13 NOTE — Progress Notes (Signed)
ANTIBIOTIC CONSULT NOTE - INITIAL  Pharmacy Consult for Ceftriaxone Indication: UTI  Allergies  Allergen Reactions  . Codeine Nausea And Vomiting    Patient Measurements: Height: 5\' 3"  (160 cm) Weight: 110 lb (49.896 kg) IBW/kg (Calculated) : 52.4  Vital Signs: Temp: 98.2 F (36.8 C) (06/22 0458) Temp Source: Oral (06/22 0458) BP: 139/85 mmHg (06/22 0458) Pulse Rate: 74 (06/22 0458) Intake/Output from previous day: 06/21 0701 - 06/22 0700 In: 840 [P.O.:240; I.V.:600] Out: 900 [Urine:900] Intake/Output from this shift: Total I/O In: 200 [P.O.:200] Out: -   Labs:  Recent Labs  10/11/14 1908 10/13/14 0422  WBC 6.5 8.5  HGB 13.2 10.8*  PLT 254 205  CREATININE 1.11* 1.01*   Estimated Creatinine Clearance: 29.7 mL/min (by C-G formula based on Cr of 1.01). No results for input(s): VANCOTROUGH, VANCOPEAK, VANCORANDOM, GENTTROUGH, GENTPEAK, GENTRANDOM, TOBRATROUGH, TOBRAPEAK, TOBRARND, AMIKACINPEAK, AMIKACINTROU, AMIKACIN in the last 72 hours.   Microbiology: No results found for this or any previous visit (from the past 720 hour(s)).  Medical History: Past Medical History  Diagnosis Date  . Arthritis   . GERD (gastroesophageal reflux disease)   . Hypertension   . Depression   . Hypercholesteremia   . Osteoporosis   . Anxiety   . Dyskinesia   . Dementia   . Esophageal stricture   . Dysrhythmia     hx. Paroxsymal A. Fib    Assessment: 37 y/oF with PMH of GERD, HTN, depression, anxiety, dementia, PAF on Xarelto PTA, chronic esophageal strictures with recurrent food impactions who presented to [facility] ED 6/20 with swallowing difficulties/dysphagia and possible food impaction. Patient underwent EGD and stricture dilation yesterday. UA this AM turbid in appearance with many bacteria, large leukocytes, TNTC WBC. Pharmacy consulted to assist with dosing of Ceftriaxone for UTI.  6/22 >> CTX >>    6/22 urine cx: ordered   Tmax: 100.75F WBC WNL  Goal of Therapy:   Appropriate antibiotic dosing for indication Eradication of infection  Plan:   Ceftriaxone 1g IV q24h.  No renal dose adjustment needed.  Pharmacy to sign-off, but will peripherally follow as culture data becomes available to evaluate appropriate antibiotic therapy.   Greer Pickerel, PharmD, BCPS Pager: [phone] 10/13/2014 8:41 AM

## 2014-10-13 NOTE — Consult Note (Signed)
    Progress Note   Subjective  feels okay today. Tolerating soft diet.    Objective   Vital signs in last 24 hours: Temp:  [98.1 F (36.7 C)-100.4 F (38 C)] 98.2 F (36.8 C) (06/22 0458) Pulse Rate:  [62-80] 74 (06/22 0458) Resp:  [14-22] 16 (06/22 0458) BP: (139-224)/(63-90) 139/85 mmHg (06/22 0458) SpO2:  [92 %-100 %] 95 % (06/22 0458) Last BM Date: 10/12/14 General:    Pleasant white female in NAD Heart:  Regular rate, irregular rhythm Abdomen:  Soft, nontender and nondistended. Normal bowel sounds. Neurologic:  Alert and oriented,  grossly normal neurologically. Psych:  Cooperative. Normal mood and affect.  Lab Results:  Recent Labs  10/11/14 1908 10/13/14 0422  WBC 6.5 8.5  HGB 13.2 10.8*  HCT 39.0 32.4*  PLT 254 205   BMET  Recent Labs  10/11/14 1908 10/13/14 0422  NA 140 135  K 4.2 3.6  CL 107 108  CO2 25 21*  GLUCOSE 115* 110*  BUN 13 13  CREATININE 1.11* 1.01*  CALCIUM 9.5 8.3*    ENDOSCOPIC IMPRESSION: 1) traumatized benign-appearing stricture at 40 cm - looks like food impaction had been there as suspected. 2) Tortuous esophagus and 5 cm hiatal hernia 3) Erythema in antrum otherwise normal stomach 4) Stricture dilated 12, 13.5 and 15 mm with balloon - 13.5 and 15 mm had some effect but fibrous stricture and no tear seen. Small heme, minimal blood loss  RECOMMENDATIONS: Dysphagia 3 diet restart Xarelto tomorrow Restart oral BP/cardiac meds today home tomorrow if ok should have another dilation in 1-3 weeks I think - with Dr. Perry  high dose PPI bid before breakfast and supper (eg omeprazole 40 or pantoprazole 40)    Assessment / Plan:    1. 81 year old female with recurrent esophageal stricture and probable food impaction based on EGD findings (see above). Stricture balloon dilated. May need another EGD (outpatient) with dilation in 1-3 weeks with Dr. Perry. Continue BID PPI before breakfast and dinner. I will coordinate  follow up care with Dr. Perry and our office will call daughter to arrange.   2. Afib. Can restart Xarelto today.   3. Low grade temp - 100.5 at 2am , 99.5 at 3am, normal  At 5am. No coughing. No complaints. WBC normal this am. Abnormal u/a, culture pending. Rocephin was started today.   LOS: 1 day   Paula Guenther  10/13/2014, 9:10 AM   [facility] Attending  I have also seen and assessed the patient and agree with the advanced practitioner's assessment and plan. We will arrange f/u with Dr. Perry after she leaves. Call if ?  Lineth Thielke E. Shahed Yeoman, MD, FACG [facility] [phone] (pager) 10/13/2014 12:35 PM   

## 2014-10-13 NOTE — Progress Notes (Signed)
[facility] PROGRESS NOTE  [name] TRZ:[id number] DOB: 1923/04/02 DOA: 10/11/2014 PCP: Astrid Divine, MD  Assessment/Plan: #1 dysphagia likely secondary to esophageal stricture Chronic in nature secondary to esophageal stricture. Patient usually gets yearly esophageal dilatation but had not had one done in approximately 15 months.  -Patient anticoagulation of Xarelto was placed on hold.  -Patient seen by GI and is now s/p endoscopy with esophageal dilitation on 6/21  #2 chronic atrial fibrillation -Was on oral diltiazem and metoprolol for rate control however secondary to dysphagia patient currently on IV Lopressor for rate control.  -Anticoagulation was placed on hold secondary to recent EGD. Would resume if stable overnight  #3 hypertension -Continue Lopressor and diltiazem. -Stable  #4 dementia Stable.  #5 gastroesophageal reflux disease PPI.  #6 depression Medications on hold secondary to problem #1. When patient and placed on a diet will resume her home regimen.  #7 prophylaxis SCDs for DVT prophylaxis.  #8 UTI without sepsis -UA suggestive of UTI -Pt febrile this AM - No leukocytosis. Not septic - Started empiric rocephin - Follow cultures - If stable in AM, consider transition to ceftin vs ciprofloxacin to complete course  Code Status: Full Family Communication: Pt in room (indicate person spoken with, relationship, and if by phone, the number) Disposition Plan: Possible d/c 6/23   Consultants:  GI  Procedures:  EDG with esophageal dilation 6/21  Antibiotics:  Rocephin 6/22   HPI/Subjective: No complaints. Eager to go home  Objective: Filed Vitals:   10/13/14 0149 10/13/14 0300 10/13/14 0458 10/13/14 1300  BP:   139/85 131/34  Pulse:   74 56  Temp: 100.4 F (38 C) 99.5 F (37.5 C) 98.2 F (36.8 C) 98.4 F (36.9 C)  TempSrc: Oral Oral Oral Oral  Resp:   16 16  Height:      Weight:      SpO2:   95% 98%     Intake/Output Summary (Last 24 hours) at 10/13/14 1853 Last data filed at 10/13/14 1800  Gross per 24 hour  Intake   1245 ml  Output    400 ml  Net    845 ml   Filed Weights   10/11/14 2304  Weight: 49.896 kg (110 lb)    Exam:   General:  Awake, in nad  Cardiovascular: regular, s1, s2  Respiratory: normal resp effort, no wheezing  Abdomen: soft, nondistended  Musculoskeletal: perfused, no clubbing   Data Reviewed: Basic Metabolic Panel:  Recent Labs Lab 10/11/14 1908 10/13/14 0422  NA 140 135  K 4.2 3.6  CL 107 108  CO2 25 21*  GLUCOSE 115* 110*  BUN 13 13  CREATININE 1.11* 1.01*  CALCIUM 9.5 8.3*   Liver Function Tests: No results for input(s): AST, ALT, ALKPHOS, BILITOT, PROT, ALBUMIN in the last 168 hours. No results for input(s): LIPASE, AMYLASE in the last 168 hours. No results for input(s): AMMONIA in the last 168 hours. CBC:  Recent Labs Lab 10/11/14 1908 10/13/14 0422  WBC 6.5 8.5  NEUTROABS 4.6  --   HGB 13.2 10.8*  HCT 39.0 32.4*  MCV 97.0 97.3  PLT 254 205   Cardiac Enzymes: No results for input(s): CKTOTAL, CKMB, CKMBINDEX, TROPONINI in the last 168 hours. BNP (last 3 results) No results for input(s): BNP in the last 8760 hours.  ProBNP (last 3 results) No results for input(s): PROBNP in the last 8760 hours.  CBG: No results for input(s): GLUCAP in the last 168 hours.  No results found  for this or any previous visit (from the past 240 hour(s)).   Studies: Dg Chest 2 View  10/11/2014   CLINICAL DATA:  Difficulty swallowing since 12:30 today. History of esophageal dilatation. Cough. Choking.  EXAM: CHEST  2 VIEW  COMPARISON:  08/18/2012  FINDINGS: Moderate-sized hiatal hernia, stable. Hyperinflation of the lungs. Heart is borderline enlarged. Biapical scarring. Lungs otherwise clear. No effusions. No acute bony abnormality.  IMPRESSION: Hyperinflation/COPD. Heart borderline enlarged. Small to moderate hiatal hernia.    Electronically Signed   By: Charlett Nose M.D.   On: 10/11/2014 19:36    Scheduled Meds: . cefTRIAXone (ROCEPHIN)  IV  1 g Intravenous Q24H  . diltiazem  120 mg Oral Q breakfast  . metoprolol tartrate  12.5 mg Oral BID  . mirtazapine  30 mg Oral QHS  . pantoprazole  40 mg Oral BID AC  . sodium chloride  3 mL Intravenous Q12H   Continuous Infusions: . sodium chloride 75 mL/hr at 10/13/14 1330    Principal Problem:   Dysphagia Active Problems:   ESOPHAGEAL STRICTURE   GERD (gastroesophageal reflux disease)   Atrial fibrillation   Esophageal obstruction due to food impaction   Essential hypertension   Dementia   CHIU, STEPHEN K  [facility] Pager [phone]. If 7PM-7AM, please contact night-coverage at [email or web address], password [facility] 10/13/2014, 6:53 PM  LOS: 1 day

## 2014-10-14 ENCOUNTER — Telehealth: Payer: Self-pay

## 2014-10-14 ENCOUNTER — Other Ambulatory Visit: Payer: Self-pay

## 2014-10-14 DIAGNOSIS — K222 Esophageal obstruction: Secondary | ICD-10-CM

## 2014-10-14 MED ORDER — CEFUROXIME AXETIL 250 MG PO TABS: 250.0000 mg | ORAL_TABLET | Freq: Two times a day (BID) | ORAL | Status: DC

## 2014-10-14 NOTE — Discharge Summary (Signed)
Physician Discharge Summary  [name] ZOX:[id number] DOB: 02-Sep-1922 DOA: 10/11/2014  PCP: Astrid Divine, MD  Admit date: 10/11/2014 Discharge date: 11/02/2014  Time spent: 20 minutes  Recommendations for Outpatient Follow-up:  1. Follow up with PCP in 1-2 weeks 2. Please be follow HR and BP closely as outpatient as pt was instructed to hold cardizem but continue BID metoprolol given bradycardia on discharge  Discharge Diagnoses:  Principal Problem:   Dysphagia Active Problems:   ESOPHAGEAL STRICTURE   GERD (gastroesophageal reflux disease)   Atrial fibrillation   Esophageal obstruction due to food impaction   Essential hypertension   Dementia   Discharge Condition: Improved  Diet recommendation: Heart healthy  Filed Weights   10/11/14 2304  Weight: 49.896 kg (110 lb)    History of present illness:  Please see admit h and p from 6/20 for details. Briefly, pt presents with dysphagia. The patient was admitted for further work up.  Hospital Course:  #1 dysphagia likely secondary to esophageal stricture -Chronic in nature secondary to esophageal stricture.  -Patient normally undergoes yearly esophageal dilatation but had not had one done in approximately 15 months prior to admission  -Patient anticoagulation of Xarelto was initially placed on hold.  -Patient seen by GI and is now s/p endoscopy with esophageal dilitation on 6/21  #2 chronic atrial fibrillation -Was continued on oral diltiazem and metoprolol per home regimen -HR noted to be bradycardic with HR in the 40-50's thus patient's cardizem was stopped and pt continued on home 12.5mg  metoprolol alone with pt to follow up with PCP as outpatient -Anticoagulation was initially placed on hold secondary to recent EGD, resumed post-procedure  #3 hypertension -ContinuedLopressor and diltiazem. -Remained stable  #4 dementia -Stable.  #5 gastroesophageal reflux disease -PPI.  #6  depression -Medications on hold secondary to problem #1. When patient and placed on a diet will resume her home regimen.  #7 prophylaxis -SCDs for DVT prophylaxis.  #8 UTI without sepsis - UA suggestive of UTI - Pt febrile this AM - No leukocytosis. Not septic - Started empiric rocephin - Follow cultures - Pt to complete course with ceftin on discharge  Procedures:  EGD with esophageal dilation 6/21  Consultations:  GI  Discharge Exam: Filed Vitals:   10/13/14 0458 10/13/14 1300 10/13/14 2119 10/14/14 0515  BP: 139/85 131/34 157/64 143/63  Pulse: 74 56 59 47  Temp: 98.2 F (36.8 C) 98.4 F (36.9 C) 97.9 F (36.6 C) 97.6 F (36.4 C)  TempSrc: Oral Oral Oral Oral  Resp: Height:      Weight:      SpO2: 95% 98% 98% 97%    General: Awake, in nad Cardiovascular: regular,s1, s2 Respiratory: normal resp effort, no wheezing  Discharge Instructions     Medication List    STOP taking these medications        diltiazem 120 MG 24 hr capsule  Commonly known as:  CARDIZEM CD      TAKE these medications        cefUROXime 250 MG tablet  Commonly known as:  CEFTIN  Take 1 tablet (250 mg total) by mouth 2 (two) times daily with a meal.     ferrous sulfate 325 (65 FE) MG tablet  Take 325 mg by mouth daily with breakfast.     metoprolol tartrate 25 MG tablet  Commonly known as:  LOPRESSOR  Take 12.5 mg by mouth 2 (two) times daily.     mirtazapine 30 MG  tablet  Commonly known as:  REMERON  Take 30 mg by mouth at bedtime.     omeprazole 20 MG capsule  Commonly known as:  PRILOSEC  Take 20 mg by mouth every morning.     Rivaroxaban 15 MG Tabs tablet  Commonly known as:  XARELTO  Take 1 tablet (15 mg total) by mouth daily with supper.       Allergies  Allergen Reactions  . Codeine Nausea And Vomiting   Follow-up Information    Follow up with Astrid Divine, MD. Schedule an appointment as soon as possible for a visit in 1 week.    Specialty:  Family Medicine   Why:  Hospital follow up   Contact information:   [address] [place] [address] [phone]        The results of significant diagnostics from this hospitalization (including imaging, microbiology, ancillary and laboratory) are listed below for reference.    Significant Diagnostic Studies: Dg Chest 2 View  10/11/2014   CLINICAL DATA:  Difficulty swallowing since 12:30 today. History of esophageal dilatation. Cough. Choking.  EXAM: CHEST  2 VIEW  COMPARISON:  08/18/2012  FINDINGS: Moderate-sized hiatal hernia, stable. Hyperinflation of the lungs. Heart is borderline enlarged. Biapical scarring. Lungs otherwise clear. No effusions. No acute bony abnormality.  IMPRESSION: Hyperinflation/COPD. Heart borderline enlarged. Small to moderate hiatal hernia.   Electronically Signed   By: Charlett Nose M.D.   On: 10/11/2014 19:36    Microbiology: No results found for this or any previous visit (from the past 240 hour(s)).   Labs: Basic Metabolic Panel: No results for input(s): NA, K, CL, CO2, GLUCOSE, BUN, CREATININE, CALCIUM, MG, PHOS in the last 168 hours. Liver Function Tests: No results for input(s): AST, ALT, ALKPHOS, BILITOT, PROT, ALBUMIN in the last 168 hours. No results for input(s): LIPASE, AMYLASE in the last 168 hours. No results for input(s): AMMONIA in the last 168 hours. CBC: No results for input(s): WBC, NEUTROABS, HGB, HCT, MCV, PLT in the last 168 hours. Cardiac Enzymes: No results for input(s): CKTOTAL, CKMB, CKMBINDEX, TROPONINI in the last 168 hours. BNP: BNP (last 3 results) No results for input(s): BNP in the last 8760 hours.  ProBNP (last 3 results) No results for input(s): PROBNP in the last 8760 hours.  CBG: No results for input(s): GLUCAP in the last 168 hours.   Signed:  CHIU, Scheryl Marten  [facility] 11/02/2014, 6:12 PM

## 2014-10-14 NOTE — Telephone Encounter (Signed)
Pt scheduled for EGD with savory dil under fluro at [facility] 10/27/14@10am . Pt to be NPO after midnight. Pt to hold blood thinners for 3 days prior to procedure. Pts daughter aware of appt and instructions. Booking number [id number].

## 2014-10-14 NOTE — Telephone Encounter (Signed)
-----   Message from Hilarie Fredrickson, MD sent at 10/12/2014  6:35 PM EDT ----- Regarding: RE: dilated Thanks Baldo Ash... Bonita Quin, we'll work on follow up and dilation during my next hospital week. We can do at [facility] some am with MAC/propofol. I'd like to hold her blood thinner 3 days prior. Thanks   ----- Message -----    From: Iva Boop, MD    Sent: 10/12/2014   4:27 PM      To: Hilarie Fredrickson, MD Subject: dilated                                        Went to 12 today, Food had been there  Will ask them to increase PPI dosing and also plan for you to do her again w/in few weeks to go higher most likely

## 2014-10-14 NOTE — Progress Notes (Signed)
Physical Therapy Treatment Patient Details Name: [name] MRN: [id number] DOB: 1922-08-03 Today's Date: 10/14/2014    History of Present Illness 81 yo female admitted with dysphagia. Hx of Afib, dementia, HTn, esophageal stricture. Pt lives alone with caregiver 2x/week.     PT Comments    Progressing with mobility. Pt performed well during stay. Plan is for d/c later today. Pt will d/c to daughter's home on today.  Follow Up Recommendations  No PT follow up;Supervision - Intermittent (pt is going to stay with daughter tonight and caregiver will be out tomorrow per duaghter)     Equipment Recommendations  None recommended by PT    Recommendations for Other Services       Precautions / Restrictions Precautions Precautions: None Restrictions Weight Bearing Restrictions: No    Mobility  Bed Mobility Overal bed mobility: Needs Assistance Bed Mobility: Supine to Sit;Sit to Supine     Supine to sit: Modified independent (Device/Increase time);HOB elevated Sit to supine: Modified independent (Device/Increase time);HOB elevated      Transfers Overall transfer level: Needs assistance Equipment used: Rolling walker (2 wheeled) Transfers: Sit to/from Stand Sit to Stand: Supervision            Ambulation/Gait Ambulation/Gait assistance: Min guard Ambulation Distance (Feet): 140 Feet Assistive device: Straight cane Gait Pattern/deviations: Step-through pattern     General Gait Details: better gait speed and pt tolerated increased distance on today. close guard for safety   Stairs            Wheelchair Mobility    Modified Rankin (Stroke Patients Only)       Balance                                    Cognition Arousal/Alertness: Awake/alert Behavior During Therapy: WFL for tasks assessed/performed Overall Cognitive Status: History of cognitive impairments - at baseline                      Exercises General Exercises  - Lower Extremity Long Arc Quad: AROM;Both;10 reps;Seated    General Comments        Pertinent Vitals/Pain Pain Assessment: No/denies pain    Home Living                      Prior Function            PT Goals (current goals can now be found in the care plan section) Progress towards PT goals: Progressing toward goals    Frequency  Min 3X/week    PT Plan Current plan remains appropriate    Co-evaluation             End of Session Equipment Utilized During Treatment: Gait belt Activity Tolerance: Patient tolerated treatment well Patient left: in bed;with family/visitor present     Time: 5208-0223 PT Time Calculation (min) (ACUTE ONLY): 10 min  Charges:  $Gait Training: 8-22 mins                    G Codes:      Rebeca Alert, MPT Pager: [phone]

## 2014-10-15 LAB — URINE CULTURE

## 2014-10-26 ENCOUNTER — Encounter (HOSPITAL_COMMUNITY): Payer: Self-pay | Admitting: *Deleted

## 2014-10-27 ENCOUNTER — Ambulatory Visit (HOSPITAL_COMMUNITY): Payer: Medicare Other | Admitting: Certified Registered"

## 2014-10-27 ENCOUNTER — Encounter (HOSPITAL_COMMUNITY)
Admission: RE | Disposition: A | Discharge status: Home / Self Care | Payer: Self-pay | Source: Ambulatory Visit | Attending: Internal Medicine

## 2014-10-27 ENCOUNTER — Ambulatory Visit (HOSPITAL_COMMUNITY)
Admission: RE | Admit: 2014-10-27 | Discharge: 2014-10-27 | Disposition: A | Discharge status: Home / Self Care | Payer: Medicare Other | Source: Ambulatory Visit | Attending: Internal Medicine | Admitting: Internal Medicine

## 2014-10-27 ENCOUNTER — Encounter (HOSPITAL_COMMUNITY): Payer: Self-pay | Admitting: *Deleted

## 2014-10-27 DIAGNOSIS — K449 Diaphragmatic hernia without obstruction or gangrene: Secondary | ICD-10-CM | POA: Diagnosis not present

## 2014-10-27 DIAGNOSIS — K2940 Chronic atrophic gastritis without bleeding: Secondary | ICD-10-CM | POA: Diagnosis not present

## 2014-10-27 DIAGNOSIS — F0390 Unspecified dementia without behavioral disturbance: Secondary | ICD-10-CM | POA: Diagnosis not present

## 2014-10-27 DIAGNOSIS — K31819 Angiodysplasia of stomach and duodenum without bleeding: Secondary | ICD-10-CM | POA: Diagnosis not present

## 2014-10-27 DIAGNOSIS — Z79899 Other long term (current) drug therapy: Secondary | ICD-10-CM | POA: Insufficient documentation

## 2014-10-27 DIAGNOSIS — I4891 Unspecified atrial fibrillation: Secondary | ICD-10-CM | POA: Diagnosis not present

## 2014-10-27 DIAGNOSIS — R1310 Dysphagia, unspecified: Secondary | ICD-10-CM | POA: Diagnosis present

## 2014-10-27 DIAGNOSIS — I10 Essential (primary) hypertension: Secondary | ICD-10-CM | POA: Insufficient documentation

## 2014-10-27 DIAGNOSIS — K219 Gastro-esophageal reflux disease without esophagitis: Secondary | ICD-10-CM | POA: Insufficient documentation

## 2014-10-27 DIAGNOSIS — K222 Esophageal obstruction: Secondary | ICD-10-CM | POA: Diagnosis not present

## 2014-10-27 DIAGNOSIS — M1990 Unspecified osteoarthritis, unspecified site: Secondary | ICD-10-CM | POA: Diagnosis not present

## 2014-10-27 HISTORY — PX: ESOPHAGOGASTRODUODENOSCOPY: SHX5428

## 2014-10-27 HISTORY — PX: BALLOON DILATION: SHX5330

## 2014-10-27 SURGERY — EGD (ESOPHAGOGASTRODUODENOSCOPY)
Anesthesia: Monitor Anesthesia Care

## 2014-10-27 MED ORDER — LACTATED RINGERS IV SOLN
INTRAVENOUS | Status: DC
  Administered 2014-10-27: 09:00:00 via INTRAVENOUS

## 2014-10-27 MED ORDER — PROPOFOL INFUSION 10 MG/ML OPTIME
INTRAVENOUS | Status: DC | PRN
  Administered 2014-10-27: 100 ug/kg/min via INTRAVENOUS

## 2014-10-27 MED ORDER — LIDOCAINE HCL (CARDIAC) 20 MG/ML IV SOLN
INTRAVENOUS | Status: DC | PRN
  Administered 2014-10-27: 40 mg via INTRAVENOUS

## 2014-10-27 MED ORDER — SODIUM CHLORIDE 0.9 % IV SOLN: INTRAVENOUS | Status: DC

## 2014-10-27 MED ORDER — BUTAMBEN-TETRACAINE-BENZOCAINE 2-2-14 % EX AERO
INHALATION_SPRAY | CUTANEOUS | Status: DC | PRN
  Administered 2014-10-27: 2 via TOPICAL

## 2014-10-27 MED ORDER — PROPOFOL 10 MG/ML IV BOLUS
INTRAVENOUS | Status: DC | PRN
  Administered 2014-10-27: 30 mg via INTRAVENOUS

## 2014-10-27 NOTE — Transfer of Care (Signed)
Immediate Anesthesia Transfer of Care Note  Patient: [name]  Procedure(s) Performed: Procedure(s): ESOPHAGOGASTRODUODENOSCOPY (EGD) (N/A) BALLOON DILATION (N/A)  Patient Location: Endoscopy Unit  Anesthesia Type:MAC  Level of Consciousness: sedated  Airway & Oxygen Therapy: Patient Spontanous Breathing and Patient connected to nasal cannula oxygen  Post-op Assessment: Report given to RN, Post -op Vital signs reviewed and stable and Patient moving all extremities  Post vital signs: Reviewed and stable  Last Vitals:  Filed Vitals:   10/27/14 0919  BP: 219/70  Pulse: 58  Temp: 36.4 C  Resp: 17    Complications: No apparent anesthesia complications

## 2014-10-27 NOTE — Op Note (Signed)
[facility] [address] [place], [id number]   ENDOSCOPY PROCEDURE REPORT  PATIENT: [name], Connie K  MR#: [id number] BIRTHDATE: Jan 11, 1923 , 89  yrs. old GENDER: female ENDOSCOPIST: Roxy CedarJohn N Jenea Dake Jr, MD REFERRED BY:  .Direct Self PROCEDURE DATE:  10/27/2014 PROCEDURE:  EGD w/ balloon dilation  -18, 19 mm ASA CLASS:     Class III INDICATIONS:  dysphagia and therapeutic procedure. Recent food impaction with balloon dilation to 15 mm. MEDICATIONS: Monitored anesthesia care and Per Anesthesia TOPICAL ANESTHETIC: none  DESCRIPTION OF PROCEDURE: After the risks benefits and alternatives of the procedure were thoroughly explained, informed consent was obtained.  The endoscope [id number] endoscope was introduced through the mouth and advanced to the second portion of the duodenum , Without limitations.  The instrument was slowly withdrawn as the mucosa was fully examined.  EXAM:The esophagus revealed a 12 mm benign ringlike stricture at the gastroesophageal junction.  Stomach revealed somewhat atrophic mucosa, very early GAVE, and hiatal hernia.  The duodenum was normal.  Retroflexed views revealed a hiatal hernia. THERAPY: A TTS sequential balloon was passed through the endoscope. The midportion of the balloon was placed across the stricture. Dilation was carried out at 18 then 19 mm. Post dilation valuation stricture revealed traumatic disruption of the ring with minor bleeding. The patient tolerated the procedure well     The scope was then withdrawn from the patient and the procedure completed.  COMPLICATIONS: There were no immediate complications.  ENDOSCOPIC IMPRESSION: 1. Peptic stricture of the esophagus status post balloon dilation to a maximal diameter of 19 mm 2. Remainder of EGD as described  RECOMMENDATIONS: 1.  Clear liquids until 1 PM, then soft foods rest of day.  Resume prior diet tomorrow. 2.  Continue PPI therapy (acid reflux  medication) 3.  Resume xeralto tomorrow  REPEAT EXAM:  eSigned:  Roxy CedarJohn N Tadeo Besecker Jr, MD 10/27/2014 10:37 AM    CC:

## 2014-10-27 NOTE — Interval H&P Note (Signed)
History and Physical Interval Note:  10/27/2014 10:11 AM  [name]  has presented today for surgery, with the diagnosis of esophageal stricture  The various methods of treatment have been discussed with the patient and family. After consideration of risks, benefits and other options for treatment, the patient has consented to  Procedure(s): ESOPHAGOGASTRODUODENOSCOPY (EGD) (N/A) BALLOON DILATION (N/A) as a surgical intervention .  The patient's history has been reviewed, patient examined, no change in status, stable for surgery.  I have reviewed the patient's chart and labs.  Questions were answered to the patient's satisfaction.     Yancey FlemingsJohn Perry

## 2014-10-27 NOTE — Discharge Instructions (Signed)
Esophagogastroduodenoscopy °Care After °Refer to this sheet in the next few weeks. These instructions provide you with information on caring for yourself after your procedure. Your caregiver may also give you more specific instructions. Your treatment has been planned according to current medical practices, but problems sometimes occur. Call your caregiver if you have any problems or questions after your procedure.  °HOME CARE INSTRUCTIONS °· Do not eat or drink anything until the numbing medicine (local anesthetic) has worn off and your gag reflex has returned. You will know that the local anesthetic has worn off when you can swallow comfortably. °· Do not drive for 12 hours after the procedure or as directed by your caregiver. °· Only take medicines as directed by your caregiver. °SEEK MEDICAL CARE IF:  °· You cannot stop coughing. °· You are not urinating at all or less than usual. °SEEK IMMEDIATE MEDICAL CARE IF: °· You have difficulty swallowing. °· You cannot eat or drink. °· You have worsening throat or chest pain. °· You have dizziness, lightheadedness, or you faint. °· You have nausea or vomiting. °· You have chills. °· You have a fever. °· You have severe abdominal pain. °· You have black, tarry, or bloody stools. °Document Released: 03/26/2012 Document Reviewed: 03/26/2012 °ExitCare® Patient Information ©2015 [employer]. This information is not intended to replace advice given to you by your health care provider. Make sure you discuss any questions you have with your health care provider. ° °

## 2014-10-27 NOTE — H&P (View-Only) (Signed)
    Progress Note   Subjective  feels okay today. Tolerating soft diet.    Objective   Vital signs in last 24 hours: Temp:  [98.1 F (36.7 C)-100.4 F (38 C)] 98.2 F (36.8 C) (06/22 0458) Pulse Rate:  [62-80] 74 (06/22 0458) Resp:  [14-22] 16 (06/22 0458) BP: (139-224)/(63-90) 139/85 mmHg (06/22 0458) SpO2:  [92 %-100 %] 95 % (06/22 0458) Last BM Date: 10/12/14 General:    Pleasant white female in NAD Heart:  Regular rate, irregular rhythm Abdomen:  Soft, nontender and nondistended. Normal bowel sounds. Neurologic:  Alert and oriented,  grossly normal neurologically. Psych:  Cooperative. Normal mood and affect.  Lab Results:  Recent Labs  10/11/14 1908 10/13/14 0422  WBC 6.5 8.5  HGB 13.2 10.8*  HCT 39.0 32.4*  PLT 254 205   BMET  Recent Labs  10/11/14 1908 10/13/14 0422  NA 140 135  K 4.2 3.6  CL 107 108  CO2 25 21*  GLUCOSE 115* 110*  BUN 13 13  CREATININE 1.11* 1.01*  CALCIUM 9.5 8.3*    ENDOSCOPIC IMPRESSION: 1) traumatized benign-appearing stricture at 40 cm - looks like food impaction had been there as suspected. 2) Tortuous esophagus and 5 cm hiatal hernia 3) Erythema in antrum otherwise normal stomach 4) Stricture dilated 12, 13.5 and 15 mm with balloon - 13.5 and 15 mm had some effect but fibrous stricture and no tear seen. Small heme, minimal blood loss  RECOMMENDATIONS: Dysphagia 3 diet restart Xarelto tomorrow Restart oral BP/cardiac meds today home tomorrow if ok should have another dilation in 1-3 weeks I think - with Dr. Marina GoodellPerry  high dose PPI bid before breakfast and supper (eg omeprazole 40 or pantoprazole 40)    Assessment / Plan:    341. 81 year old female with recurrent esophageal stricture and probable food impaction based on EGD findings (see above). Stricture balloon dilated. May need another EGD (outpatient) with dilation in 1-3 weeks with Dr. Marina GoodellPerry. Continue BID PPI before breakfast and dinner. I will coordinate  follow up care with Dr. Marina GoodellPerry and our office will call daughter to arrange.   2. Afib. Can restart Xarelto today.   3. Low grade temp - 100.5 at 2am , 99.5 at 3am, normal  At 5am. No coughing. No complaints. WBC normal this am. Abnormal u/a, culture pending. Rocephin was started today.   LOS: 1 day   Willette Clusteraula Guenther  10/13/2014, 9:10 AM   [facility] Attending  I have also seen and assessed the patient and agree with the advanced practitioner's assessment and plan. We will arrange f/u with Dr. Marina GoodellPerry after she leaves. Call if ?  Iva Booparl E. Cephas Revard, MD, Antionette FairyFACG [facility] [phone] (pager) 10/13/2014 12:35 PM

## 2014-10-27 NOTE — Anesthesia Preprocedure Evaluation (Addendum)
Anesthesia Evaluation  Patient identified by MRN, date of birth, ID band Patient awake    Reviewed: Allergy & Precautions, H&P , NPO status , Patient's Chart, lab work & pertinent test results  Airway Mallampati: II  TM Distance: >3 FB Neck ROM: Full    Dental no notable dental hx.    Pulmonary neg pulmonary ROS,  breath sounds clear to auscultation  Pulmonary exam normal       Cardiovascular hypertension, Pt. on medications Normal cardiovascular exam+ dysrhythmias Rhythm:Regular Rate:Normal  Saw Dr. Mendel RyderH. Smith in November 2014   Neuro/Psych PSYCHIATRIC DISORDERS Anxiety Depression Minor dementia per daughter. negative neurological ROS     GI/Hepatic Neg liver ROS, GERD-  Medicated,  Endo/Other  negative endocrine ROS  Renal/GU negative Renal ROS     Musculoskeletal negative musculoskeletal ROS (+) Arthritis -,   Abdominal   Peds  Hematology  (+) anemia ,   Anesthesia Other Findings   Reproductive/Obstetrics negative OB ROS                            Anesthesia Physical  Anesthesia Plan  ASA: III  Anesthesia Plan: MAC   Post-op Pain Management:    Induction: Intravenous  Airway Management Planned:   Additional Equipment:   Intra-op Plan:   Post-operative Plan:   Informed Consent: I have reviewed the patients History and Physical, chart, labs and discussed the procedure including the risks, benefits and alternatives for the proposed anesthesia with the patient or authorized representative who has indicated his/her understanding and acceptance.   Dental advisory given  Plan Discussed with: CRNA  Anesthesia Plan Comments:         Anesthesia Quick Evaluation

## 2014-10-28 ENCOUNTER — Encounter (HOSPITAL_COMMUNITY): Payer: Self-pay | Admitting: Internal Medicine

## 2014-10-29 NOTE — Anesthesia Postprocedure Evaluation (Signed)
Anesthesia Post Note  Patient: [name]  Procedure(s) Performed: Procedure(s) (LRB): ESOPHAGOGASTRODUODENOSCOPY (EGD) (N/A) BALLOON DILATION (N/A)  Anesthesia type: MAC  Patient location: PACU  Post pain: Pain level controlled  Post assessment: Post-op Vital signs reviewed  Last Vitals: BP 206/79 mmHg  Pulse 58  Temp(Src) 36.6 C (Oral)  Resp 19  SpO2 98%  Post vital signs: Reviewed  Level of consciousness: awake  Complications: No apparent anesthesia complications

## 2015-09-06 ENCOUNTER — Inpatient Hospital Stay (HOSPITAL_COMMUNITY)
Admission: EM | Admit: 2015-09-06 | Discharge: 2015-09-11 | DRG: 689 | Disposition: A | Discharge status: Skilled Nursing Facility | Payer: Medicare Other | Attending: Internal Medicine | Admitting: Internal Medicine

## 2015-09-06 ENCOUNTER — Encounter (HOSPITAL_COMMUNITY): Payer: Self-pay

## 2015-09-06 ENCOUNTER — Emergency Department (HOSPITAL_COMMUNITY): Payer: Medicare Other

## 2015-09-06 DIAGNOSIS — M1990 Unspecified osteoarthritis, unspecified site: Secondary | ICD-10-CM | POA: Diagnosis present

## 2015-09-06 DIAGNOSIS — R627 Adult failure to thrive: Secondary | ICD-10-CM | POA: Diagnosis present

## 2015-09-06 DIAGNOSIS — I482 Chronic atrial fibrillation, unspecified: Secondary | ICD-10-CM

## 2015-09-06 DIAGNOSIS — G9340 Encephalopathy, unspecified: Secondary | ICD-10-CM | POA: Diagnosis present

## 2015-09-06 DIAGNOSIS — K219 Gastro-esophageal reflux disease without esophagitis: Secondary | ICD-10-CM | POA: Diagnosis present

## 2015-09-06 DIAGNOSIS — B952 Enterococcus as the cause of diseases classified elsewhere: Secondary | ICD-10-CM | POA: Diagnosis present

## 2015-09-06 DIAGNOSIS — M810 Age-related osteoporosis without current pathological fracture: Secondary | ICD-10-CM | POA: Diagnosis present

## 2015-09-06 DIAGNOSIS — E876 Hypokalemia: Secondary | ICD-10-CM | POA: Diagnosis present

## 2015-09-06 DIAGNOSIS — E7800 Pure hypercholesterolemia, unspecified: Secondary | ICD-10-CM | POA: Diagnosis present

## 2015-09-06 DIAGNOSIS — I10 Essential (primary) hypertension: Secondary | ICD-10-CM | POA: Diagnosis present

## 2015-09-06 DIAGNOSIS — Z79899 Other long term (current) drug therapy: Secondary | ICD-10-CM | POA: Diagnosis not present

## 2015-09-06 DIAGNOSIS — R531 Weakness: Secondary | ICD-10-CM | POA: Diagnosis not present

## 2015-09-06 DIAGNOSIS — N390 Urinary tract infection, site not specified: Principal | ICD-10-CM

## 2015-09-06 DIAGNOSIS — F419 Anxiety disorder, unspecified: Secondary | ICD-10-CM | POA: Diagnosis present

## 2015-09-06 DIAGNOSIS — F329 Major depressive disorder, single episode, unspecified: Secondary | ICD-10-CM | POA: Diagnosis present

## 2015-09-06 DIAGNOSIS — F0390 Unspecified dementia without behavioral disturbance: Secondary | ICD-10-CM | POA: Diagnosis present

## 2015-09-06 DIAGNOSIS — Z66 Do not resuscitate: Secondary | ICD-10-CM | POA: Diagnosis present

## 2015-09-06 DIAGNOSIS — E860 Dehydration: Secondary | ICD-10-CM | POA: Diagnosis present

## 2015-09-06 DIAGNOSIS — Z7901 Long term (current) use of anticoagulants: Secondary | ICD-10-CM | POA: Diagnosis not present

## 2015-09-06 DIAGNOSIS — I4891 Unspecified atrial fibrillation: Secondary | ICD-10-CM | POA: Diagnosis present

## 2015-09-06 LAB — BASIC METABOLIC PANEL
Anion gap: 9 (ref 5–15)
BUN: 16 mg/dL (ref 6–20)
CHLORIDE: 103 mmol/L (ref 101–111)
CO2: 24 mmol/L (ref 22–32)
CREATININE: 1.11 mg/dL — AB (ref 0.44–1.00)
Calcium: 8.8 mg/dL — ABNORMAL LOW (ref 8.9–10.3)
GFR calc Af Amer: 49 mL/min — ABNORMAL LOW (ref >60)
GFR calc non Af Amer: 42 mL/min — ABNORMAL LOW (ref >60)
GLUCOSE: 147 mg/dL — AB (ref 65–99)
Potassium: 4 mmol/L (ref 3.5–5.1)
Sodium: 136 mmol/L (ref 135–145)

## 2015-09-06 LAB — URINE MICROSCOPIC-ADD ON

## 2015-09-06 LAB — CBC
HCT: 39.2 % (ref 36.0–46.0)
Hemoglobin: 13.5 g/dL (ref 12.0–15.0)
MCH: 33.1 pg (ref 26.0–34.0)
MCHC: 34.4 g/dL (ref 30.0–36.0)
MCV: 96.1 fL (ref 78.0–100.0)
PLATELETS: 263 10*3/uL (ref 150–400)
RBC: 4.08 MIL/uL (ref 3.87–5.11)
RDW: 13.2 % (ref 11.5–15.5)
WBC: 7.4 10*3/uL (ref 4.0–10.5)

## 2015-09-06 LAB — URINALYSIS, ROUTINE W REFLEX MICROSCOPIC
GLUCOSE, UA: NEGATIVE mg/dL
HGB URINE DIPSTICK: NEGATIVE
Ketones, ur: 15 mg/dL — AB
Nitrite: NEGATIVE
PROTEIN: NEGATIVE mg/dL
Specific Gravity, Urine: 1.021 (ref 1.005–1.030)
pH: 6.5 (ref 5.0–8.0)

## 2015-09-06 LAB — PROTIME-INR
INR: 1.34 (ref 0.00–1.49)
Prothrombin Time: 16.7 seconds — ABNORMAL HIGH (ref 11.6–15.2)

## 2015-09-06 LAB — CK: Total CK: 117 U/L (ref 38–234)

## 2015-09-06 LAB — CBG MONITORING, ED: Glucose-Capillary: 157 mg/dL — ABNORMAL HIGH (ref 65–99)

## 2015-09-06 MED ORDER — METOPROLOL TARTRATE 25 MG PO TABS
25.0000 mg | ORAL_TABLET | Freq: Two times a day (BID) | ORAL | Status: DC
  Administered 2015-09-06 – 2015-09-07 (×2): 25 mg via ORAL
  Filled 2015-09-06 (×2): qty 1

## 2015-09-06 MED ORDER — SODIUM CHLORIDE 0.9% FLUSH
3.0000 mL | Freq: Two times a day (BID) | INTRAVENOUS | Status: DC
  Administered 2015-09-07 – 2015-09-11 (×5): 3 mL via INTRAVENOUS

## 2015-09-06 MED ORDER — ONDANSETRON HCL 4 MG PO TABS
4.0000 mg | ORAL_TABLET | Freq: Four times a day (QID) | ORAL | Status: DC | PRN
  Administered 2015-09-08: 4 mg via ORAL
  Filled 2015-09-06: qty 1

## 2015-09-06 MED ORDER — RIVAROXABAN 15 MG PO TABS
15.0000 mg | ORAL_TABLET | Freq: Every day | ORAL | Status: DC
  Administered 2015-09-06 – 2015-09-10 (×5): 15 mg via ORAL
  Filled 2015-09-06 (×6): qty 1

## 2015-09-06 MED ORDER — PANTOPRAZOLE SODIUM 40 MG PO TBEC
40.0000 mg | DELAYED_RELEASE_TABLET | Freq: Every day | ORAL | Status: DC
  Administered 2015-09-07 – 2015-09-11 (×5): 40 mg via ORAL
  Filled 2015-09-06 (×5): qty 1

## 2015-09-06 MED ORDER — ACETAMINOPHEN 325 MG PO TABS
650.0000 mg | ORAL_TABLET | Freq: Four times a day (QID) | ORAL | Status: DC | PRN
  Administered 2015-09-08 – 2015-09-11 (×6): 650 mg via ORAL
  Filled 2015-09-06 (×6): qty 2

## 2015-09-06 MED ORDER — SODIUM CHLORIDE 0.9 % IV BOLUS (SEPSIS)
1000.0000 mL | Freq: Once | INTRAVENOUS | Status: AC
  Administered 2015-09-06: 1000 mL via INTRAVENOUS

## 2015-09-06 MED ORDER — DILTIAZEM HCL 25 MG/5ML IV SOLN
10.0000 mg | Freq: Once | INTRAVENOUS | Status: AC
  Administered 2015-09-06: 10 mg via INTRAVENOUS
  Filled 2015-09-06: qty 5

## 2015-09-06 MED ORDER — DILTIAZEM HCL ER COATED BEADS 120 MG PO CP24
120.0000 mg | ORAL_CAPSULE | Freq: Every day | ORAL | Status: DC
  Administered 2015-09-07: 120 mg via ORAL
  Filled 2015-09-06: qty 1

## 2015-09-06 MED ORDER — ACETAMINOPHEN 650 MG RE SUPP: 650.0000 mg | Freq: Four times a day (QID) | RECTAL | Status: DC | PRN

## 2015-09-06 MED ORDER — ONDANSETRON HCL 4 MG/2ML IJ SOLN
4.0000 mg | Freq: Four times a day (QID) | INTRAMUSCULAR | Status: DC | PRN
  Administered 2015-09-10: 4 mg via INTRAVENOUS
  Filled 2015-09-06: qty 2

## 2015-09-06 MED ORDER — DEXTROSE 5 % IV SOLN
1.0000 g | INTRAVENOUS | Status: DC
  Administered 2015-09-06: 1 g via INTRAVENOUS
  Filled 2015-09-06: qty 10

## 2015-09-06 MED ORDER — METOPROLOL TARTRATE 5 MG/5ML IV SOLN
5.0000 mg | INTRAVENOUS | Status: DC | PRN
  Administered 2015-09-06 – 2015-09-07 (×3): 5 mg via INTRAVENOUS
  Filled 2015-09-06 (×3): qty 5

## 2015-09-06 MED ORDER — SODIUM CHLORIDE 0.9 % IV SOLN
INTRAVENOUS | Status: DC
  Administered 2015-09-06 – 2015-09-07 (×2): via INTRAVENOUS

## 2015-09-06 MED ORDER — DEXTROSE 5 % IV SOLN
1.0000 g | INTRAVENOUS | Status: DC
  Administered 2015-09-07 – 2015-09-08 (×2): 1 g via INTRAVENOUS
  Filled 2015-09-06 (×3): qty 10

## 2015-09-06 NOTE — ED Notes (Signed)
Patient presents to ED accompanied by son-in-law. Son-in-law reports patient lives at home alone and fell during some point between late Friday (5/12) evening and into early Saturday (5/13) when he and his wife found her lying on her right side on the floor. He reports that the patient did complain of some hip pain at the time, but denies feeling pain since. Patient denies feeling pain upon admission. Son-in-law reports that the patient has dementia and a has a caregiver Mon-Fri. He reports that the caregiver noted a decline in her mobility, strength and recall since her fall on Saturday. Son-in-law also reports the patient had difficulty swallowing her pills this morning and that she has a history of esophageal expansion.

## 2015-09-06 NOTE — ED Notes (Signed)
Patient ambulated to and from bathroom, approximately 100 ft utilizing her personal cane on her R side. Patient reported feeling a little unsteady, but denied feeling pain or dizziness. Upon returning to her room, she was hooked back up to the cardiac monitor which showed her in AFIB with a heart rate in the 120s-140s. After 20 minutes her heart rate is in the 105-115. Report given to PA. Will continue to monitor.

## 2015-09-06 NOTE — ED Provider Notes (Signed)
CSN: [id number]     Arrival date & time 09/06/15  1122 History   First MD Initiated Contact with Patient 09/06/15 1158     Chief Complaint  Patient presents with  . Weakness   HPI   82 year old female presents today with complaints of weakness. Family is at bedside and notes that patient was last seen normal on Friday. They note that they called to check on her on Saturday, she did not answer the phone. Patient was found on the floor in the den at 8 PM Saturday night. They rarely get her up off the floor, patient appeared weak. They took the patient to their house and have been taking care of her since. They note that normally patient ambulates with there minimal difficulty with the assistance of a cane, but over the last several days has been needing additional assistance with ambulation and getting out of bed. The report patient complains of fatigue, but denies any acute pain. They deny any fever, chills, nausea, vomiting, diarrhea or abdominal pain. Family notes that occasionally patient does have dark stools, but no blood per rectum. They deny history of frequent falls, noting she did have a fall that caused an ankle fracture previously. Family notes that over the weekend patient has appeared anxious and did not want to be left alone.  Patient has a significant past medical history of A. fib with RVR. Patient was last seen in the hospital on 10/14/2014, she was noted to have chronic atrial fibrillation, was receiving Cardizem and metoprolol at that time. She was instructed to discontinue diltiazem and continue metoprolol. Family notes that she followed up with cardiology who instructed patient to continue using diltiazem 150 mg daily, and metoprolol 25. They note that they bring patient her medications, but she takes them on her own. They note last week she did not miss any medications, but occasionally she does miss a dose.   Past Medical History  Diagnosis Date  . Arthritis   . GERD  (gastroesophageal reflux disease)   . Hypertension   . Depression   . Hypercholesteremia   . Osteoporosis   . Anxiety   . Dyskinesia   . Dementia   . Esophageal stricture   . Dysrhythmia     hx. Paroxsymal A. Fib   Past Surgical History  Procedure Laterality Date  . Tonsillectomy    . Esophagogastroduodenoscopy      with dil, multiple times  . Esophagogastroduodenoscopy  06/26/2011    Procedure: ESOPHAGOGASTRODUODENOSCOPY (EGD);  Surgeon: [name], MD;  Location: [facility];  Service: Endoscopy;  Laterality: N/A;  with c-arm  . Savory dilation  06/26/2011    Procedure: SAVORY DILATION;  Surgeon: [name], MD;  Location: [facility];  Service: Endoscopy;  Laterality: N/A;  . Esophagogastroduodenoscopy  10/22/2011    Procedure: ESOPHAGOGASTRODUODENOSCOPY (EGD);  Surgeon: Hilarie Fredrickson, MD;  Location: [facility];  Service: Endoscopy;  Laterality: N/A;  . Savory dilation  10/22/2011    Procedure: SAVORY DILATION;  Surgeon: Hilarie Fredrickson, MD;  Location: [facility];  Service: Endoscopy;  Laterality: N/A;  . Esophagogastroduodenoscopy  03/04/2012    Procedure: ESOPHAGOGASTRODUODENOSCOPY (EGD);  Surgeon: Hilarie Fredrickson, MD;  Location: [facility];  Service: Endoscopy;  Laterality: N/A;  . Savory dilation  03/04/2012    Procedure: SAVORY DILATION;  Surgeon: Hilarie Fredrickson, MD;  Location: [facility];  Service: Endoscopy;  Laterality: N/A;  . Esophagogastroduodenoscopy N/A 07/15/2012    Procedure: ESOPHAGOGASTRODUODENOSCOPY (EGD);  Surgeon: Hilarie Fredrickson, MD;  Location: [facility];  Service: Endoscopy;  Laterality: N/A;  . Savory dilation N/A 07/15/2012    Procedure: SAVORY DILATION;  Surgeon: Hilarie Fredrickson, MD;  Location: [facility];  Service: Endoscopy;  Laterality: N/A;  . Orif ankle fracture Right 08/21/2012    Procedure: OPEN REDUCTION INTERNAL FIXATION (ORIF) ANKLE FRACTURE;  Surgeon: Nilda Simmer, MD;  Location: [facility] OR;  Service: Orthopedics;  Laterality: Right;  . Cataract extraction,  bilateral Bilateral   . Esophagogastroduodenoscopy N/A 05/18/2013    Procedure: ESOPHAGOGASTRODUODENOSCOPY (EGD);  Surgeon: Hilarie Fredrickson, MD;  Location: [facility];  Service: Endoscopy;  Laterality: N/A;  . Savory dilation N/A 05/18/2013    Procedure: SAVORY DILATION;  Surgeon: Hilarie Fredrickson, MD;  Location: [facility];  Service: Endoscopy;  Laterality: N/A;  . Esophagogastroduodenoscopy N/A 10/12/2014    Procedure: ESOPHAGOGASTRODUODENOSCOPY (EGD);  Surgeon: Iva Boop, MD;  Location: [facility];  Service: Endoscopy;  Laterality: N/A;  . Esophagogastroduodenoscopy N/A 10/27/2014    Procedure: ESOPHAGOGASTRODUODENOSCOPY (EGD);  Surgeon: Hilarie Fredrickson, MD;  Location: [facility];  Service: Endoscopy;  Laterality: N/A;  . Balloon dilation N/A 10/27/2014    Procedure: BALLOON DILATION;  Surgeon: Hilarie Fredrickson, MD;  Location: [facility] END[facility]COPY;  Service: Endoscopy;  Laterality: N/A;   Family History  Problem Relation Age of Onset  . Family history unknown: Yes   Social History  Substance Use Topics  . Smoking status: Never Smoker   . Smokeless tobacco: Never Used  . Alcohol Use: No   OB History    No data available     Review of Systems  All other systems reviewed and are negative.   Allergies  Codeine  Home Medications   Prior to Admission medications   Medication Sig Start Date End Date Taking? Authorizing Provider  CARTIA XT 120 MG 24 hr capsule Take 120 mg by mouth daily. 07/29/15  Yes Historical Provider, MD  ferrous sulfate 325 (65 FE) MG tablet Take 325 mg by mouth daily with breakfast.   Yes Historical Provider, MD  metoprolol tartrate (LOPRESSOR) 25 MG tablet Take 25 mg by mouth 2 (two) times daily.    Yes Historical Provider, MD  omeprazole (PRILOSEC) 20 MG capsule Take 20 mg by mouth 2 (two) times daily before a meal.    Yes Historical Provider, MD  Rivaroxaban (XARELTO) 15 MG TABS tablet Take 1 tablet (15 mg total) by mouth daily with supper. 08/25/12  Yes Belkys A Regalado,  MD   BP 144/91 mmHg  Pulse 108  Temp(Src) 98.4 F (36.9 C) (Oral)  Resp 14  SpO2 100%   Physical Exam  Constitutional: She is oriented to person, place, and time. She appears well-developed and well-nourished.  HENT:  Head: Normocephalic and atraumatic.  Eyes: Conjunctivae are normal. Pupils are equal, round, and reactive to light. Right eye exhibits no discharge. Left eye exhibits no discharge. No scleral icterus.  Neck: Normal range of motion. No JVD present. No tracheal deviation present.  Cardiovascular:  Irregularly irregular rhythm  Pulmonary/Chest: Effort normal. No stridor.  Abdominal: Soft. She exhibits no distension and no mass. There is no tenderness. There is no rebound and no guarding.  Musculoskeletal: She exhibits no edema.  Hips are stable with no tenderness palpation, upper and lower extremities nontender to palpation, chest no pain with AP or lateral compression, no CT or L-spine tenderness.  Neurological: She is alert and oriented to person, place, and time. Coordination normal.  Psychiatric: She has a normal mood and affect.  Her behavior is normal. Judgment and thought content normal.  Nursing note and vitals reviewed.   ED Course  Procedures (including critical care time) Labs Review Labs Reviewed  BASIC METABOLIC PANEL - Abnormal; Notable for the following:    Glucose, Bld 147 (*)    Creatinine, Ser 1.11 (*)    Calcium 8.8 (*)    GFR calc non Af Amer 42 (*)    GFR calc Af Amer 49 (*)    All other components within normal limits  URINALYSIS, ROUTINE W REFLEX MICROSCOPIC (NOT AT [facility]) - Abnormal; Notable for the following:    APPearance CLOUDY (*)    Bilirubin Urine SMALL (*)    Ketones, ur 15 (*)    Leukocytes, UA MODERATE (*)    All other components within normal limits  PROTIME-INR - Abnormal; Notable for the following:    Prothrombin Time 16.7 (*)    All other components within normal limits  URINE MICROSCOPIC-ADD ON - Abnormal; Notable for the  following:    Squamous Epithelial / LPF 0-5 (*)    Bacteria, UA MANY (*)    Crystals CA OXALATE CRYSTALS (*)    All other components within normal limits  CBG MONITORING, ED - Abnormal; Notable for the following:    Glucose-Capillary 157 (*)    All other components within normal limits  URINE CULTURE  CBC  CK    Imaging Review Dg Chest 2 View  09/06/2015  CLINICAL DATA:  Fall. EXAM: CHEST  2 VIEW COMPARISON:  October 11, 2014. FINDINGS: Stable cardiomediastinal silhouette. No pneumothorax or pleural effusion is noted. No acute pulmonary disease is noted. Bony thorax is unremarkable. IMPRESSION: No active cardiopulmonary disease. Electronically Signed   By: Lupita RaiderJames  Green Jr, M.D.   On: 09/06/2015 13:06   Ct Head Wo Contrast  09/06/2015  CLINICAL DATA:  Recent fall EXAM: CT HEAD WITHOUT CONTRAST TECHNIQUE: Contiguous axial images were obtained from the base of the skull through the vertex without intravenous contrast. COMPARISON:  01/28/2007 FINDINGS: The bony calvarium is intact. Diffuse atrophic changes are noted. No findings to suggest acute hemorrhage, acute infarction or space-occupying mass lesion is noted. IMPRESSION: Chronic atrophic changes without acute abnormality. Electronically Signed   By: Alcide CleverMark  Lukens M.D.   On: 09/06/2015 12:59   I have personally reviewed and evaluated these images and lab results as part of my medical decision-making.   EKG Interpretation   Date/Time:  Tuesday Sep 06 2015 11:57:13 EDT Ventricular Rate:  140 PR Interval:    QRS Duration: 77 QT Interval:  261 QTC Calculation: 398 R Axis:   -10 Text Interpretation:  Atrial fibrillation with rapid V-rate Repolarization  abnormality, prob rate related Baseline wander in lead(s) V5 A fib with  RVR similar to previous Confirmed by LITTLE MD, RACHEL [id number]) on  09/06/2015 12:01:54 PM Also confirmed by LITTLE MD, RACHEL [phone]), editor  Stout CT, Jola BabinskiMarilyn [phone]([id number])  on 09/06/2015 1:12:50 PM      MDM   Final  diagnoses:  UTI (lower urinary tract infection)  Chronic atrial fibrillation (HCC)    Labs:BMP, CBC, CK, PT/INR, CBG  Imaging: DG chest 2 view, CT head without  Consults: [facility]  [employer]: Diltiazem  Discharge Meds:   Assessment/Plan: 82 year old female presents today with urinary tract infection, weakness, and A. fib RVR. Patient had a fall was found down, has needed assistance with ambulation since then. Patient has history of chronic A. fib, taking diltiazem and metoprolol at home, with tachycardia here in the  ED, responded well to bolus of diltiazem.  Patient was ambulated here in the ED, she was unsteady, and had elevated heart rate again.   Pt has remained stable while here in the ED. She will need hospital admission for continued rate control and UTI.               Eyvonne Mechanic, PA-C 09/06/15 1613  Marily Memos, MD 09/06/15 [phone]

## 2015-09-06 NOTE — ED Notes (Signed)
Bed: [id number] Expected date:  Expected time:  Means of arrival:  Comments: EMS - Weakness, 82 yo

## 2015-09-06 NOTE — ED Provider Notes (Signed)
Medical screening examination/treatment/procedure(s) were conducted as a shared visit with non-physician practitioner(s) and myself.  I personally evaluated the patient during the encounter.  82 yo here with weakness. On my exam afib w/ rvr, overall weakness but no specific complaints. No focal neuro deficits. No other abnormalities on exam.  Will eval for infections or other causes of weakness. Will give dilt bolus and monitor. Likely needs admission.    EKG Interpretation   Date/Time:  Tuesday Sep 06 2015 11:57:13 EDT Ventricular Rate:  140 PR Interval:    QRS Duration: 77 QT Interval:  261 QTC Calculation: 398 R Axis:   -10 Text Interpretation:  Atrial fibrillation with rapid V-rate Repolarization  abnormality, prob rate related Baseline wander in lead(s) V5 A fib with  RVR similar to previous Confirmed by LITTLE MD, RACHEL [id number]) on  09/06/2015 12:01:54 PM        [name], MD 09/06/15 1625

## 2015-09-06 NOTE — H&P (Signed)
History and Physical    [name] ZOX:[id number] DOB: 1923-04-20 DOA: 09/06/2015  PCP: Astrid Divine, MD Patient coming from: Home  Chief Complaint: Functional decline  HPI: [name] is a 82 y.o. female with medical history significant of cognitive impairment who currently resides in the community, living at home alone with family members nearby. [name] unable to provide history or participate in her her on plan of care, history was obtained from her daughter who was present at bedside. They state that last Friday she was seen to be in her usual state health. They called her on Saturday and did not answer the phone for which they went over that evening the check on her found her on the floor in the living room down. She was able to ambulate and they took her back to their house. Over the last 48 hours she has had a functional decline becoming increasingly confused, disoriented, having generalized weakness, not functioning at her baseline. Her daughter reports that at baseline she has cognitive impairment and needs assistance with instrumental activities of daily living and several activities of daily living. She uses a cane to get around. She has home health care 3 days a week.  ED Course: In the emergency department she was found to be in A. fib with RVR, that responded to IV diltiazem administered in the emergency department. A urinalysis showed the presence of urinary tract infection. Chest x-ray was negative for acute cardiopulmonary disease. Labs revealed a creatinine of 1.11 with BUN of 16. Her CK was 117.  Review of Systems: Patient unable to provide reliable review of systems due to dementia and acute encephalopathy  Past Medical History  Diagnosis Date  . Arthritis   . GERD (gastroesophageal reflux disease)   . Hypertension   . Depression   . Hypercholesteremia   . Osteoporosis   . Anxiety   . Dyskinesia   . Dementia   . Esophageal stricture   .  Dysrhythmia     hx. Paroxsymal A. Fib    Past Surgical History  Procedure Laterality Date  . Tonsillectomy    . Esophagogastroduodenoscopy      with dil, multiple times  . Esophagogastroduodenoscopy  06/26/2011    Procedure: ESOPHAGOGASTRODUODENOSCOPY (EGD);  Surgeon: [name], MD;  Location: [facility];  Service: Endoscopy;  Laterality: N/A;  with c-arm  . Savory dilation  06/26/2011    Procedure: SAVORY DILATION;  Surgeon: [name], MD;  Location: [facility];  Service: Endoscopy;  Laterality: N/A;  . Esophagogastroduodenoscopy  10/22/2011    Procedure: ESOPHAGOGASTRODUODENOSCOPY (EGD);  Surgeon: Hilarie Fredrickson, MD;  Location: [facility];  Service: Endoscopy;  Laterality: N/A;  . Savory dilation  10/22/2011    Procedure: SAVORY DILATION;  Surgeon: Hilarie Fredrickson, MD;  Location: [facility];  Service: Endoscopy;  Laterality: N/A;  . Esophagogastroduodenoscopy  03/04/2012    Procedure: ESOPHAGOGASTRODUODENOSCOPY (EGD);  Surgeon: Hilarie Fredrickson, MD;  Location: [facility];  Service: Endoscopy;  Laterality: N/A;  . Savory dilation  03/04/2012    Procedure: SAVORY DILATION;  Surgeon: Hilarie Fredrickson, MD;  Location: [facility];  Service: Endoscopy;  Laterality: N/A;  . Esophagogastroduodenoscopy N/A 07/15/2012    Procedure: ESOPHAGOGASTRODUODENOSCOPY (EGD);  Surgeon: Hilarie Fredrickson, MD;  Location: [facility];  Service: Endoscopy;  Laterality: N/A;  . Savory dilation N/A 07/15/2012    Procedure: SAVORY DILATION;  Surgeon: Hilarie Fredrickson, MD;  Location: [facility];  Service: Endoscopy;  Laterality: N/A;  . Orif ankle  fracture Right 08/21/2012    Procedure: OPEN REDUCTION INTERNAL FIXATION (ORIF) ANKLE FRACTURE;  Surgeon: Nilda Simmer, MD;  Location: [facility] OR;  Service: Orthopedics;  Laterality: Right;  . Cataract extraction, bilateral Bilateral   . Esophagogastroduodenoscopy N/A 05/18/2013    Procedure: ESOPHAGOGASTRODUODENOSCOPY (EGD);  Surgeon: Hilarie Fredrickson, MD;  Location: [facility];  Service:  Endoscopy;  Laterality: N/A;  . Savory dilation N/A 05/18/2013    Procedure: SAVORY DILATION;  Surgeon: Hilarie Fredrickson, MD;  Location: [facility];  Service: Endoscopy;  Laterality: N/A;  . Esophagogastroduodenoscopy N/A 10/12/2014    Procedure: ESOPHAGOGASTRODUODENOSCOPY (EGD);  Surgeon: Iva Boop, MD;  Location: [facility];  Service: Endoscopy;  Laterality: N/A;  . Esophagogastroduodenoscopy N/A 10/27/2014    Procedure: ESOPHAGOGASTRODUODENOSCOPY (EGD);  Surgeon: Hilarie Fredrickson, MD;  Location: [facility] ENDOSCOPY;  Service: Endoscopy;  Laterality: N/A;  . Balloon dilation N/A 10/27/2014    Procedure: BALLOON DILATION;  Surgeon: Hilarie Fredrickson, MD;  Location: [facility] ENDOSCOPY;  Service: Endoscopy;  Laterality: N/A;     reports that she has never smoked. She has never used smokeless tobacco. She reports that she does not drink alcohol or use illicit drugs.  Allergies  Allergen Reactions  . Codeine Nausea And Vomiting    Family History  Problem Relation Age of Onset  . Family history unknown: Yes     Prior to Admission medications   Medication Sig Start Date End Date Taking? Authorizing Provider  CARTIA XT 120 MG 24 hr capsule Take 120 mg by mouth daily. 07/29/15  Yes Historical Provider, MD  ferrous sulfate 325 (65 FE) MG tablet Take 325 mg by mouth daily with breakfast.   Yes Historical Provider, MD  metoprolol tartrate (LOPRESSOR) 25 MG tablet Take 25 mg by mouth 2 (two) times daily.    Yes Historical Provider, MD  omeprazole (PRILOSEC) 20 MG capsule Take 20 mg by mouth 2 (two) times daily before a meal.    Yes Historical Provider, MD  Rivaroxaban (XARELTO) 15 MG TABS tablet Take 1 tablet (15 mg total) by mouth daily with supper. 08/25/12  Yes Alba Cory, MD    Physical Exam: Filed Vitals:   09/06/15 1230 09/06/15 1302 09/06/15 1330 09/06/15 1505  BP: 142/73 144/82 130/64 144/91  Pulse: 133 105 83 108  Temp:  98.4 F (36.9 C)    TempSrc:  Oral    Resp: 21 23 27 14   SpO2: 100% 97% 97%  100%    Constitutional: She is awake and alert however confused, disoriented, cannot provide history. Filed Vitals:   09/06/15 1230 09/06/15 1302 09/06/15 1330 09/06/15 1505  BP: 142/73 144/82 130/64 144/91  Pulse: 133 105 83 108  Temp:  98.4 F (36.9 C)    TempSrc:  Oral    Resp: 21 23 27 14   SpO2: 100% 97% 97% 100%   Eyes: PERRL, lids and conjunctivae normal ENMT: Mucous membranes are moist. Posterior pharynx clear of any exudate or lesions.Normal dentition. Dry oral mucosa Neck: normal, supple, no masses, no thyromegaly Respiratory: clear to auscultation bilaterally, no wheezing, no crackles. Normal respiratory effort. No accessory muscle use.  Cardiovascular: Regular rate and rhythm, no murmurs / rubs / gallops. No extremity edema. 2+ pedal pulses. No carotid bruits.  Abdomen: no tenderness, no masses palpated. No hepatosplenomegaly. Bowel sounds positive.  Musculoskeletal: no clubbing / cyanosis. No joint deformity upper and lower extremities. Good ROM, no contractures. Normal muscle tone.  Skin: no rashes, lesions, ulcers. No induration Neurologic: CN 2-12  grossly intact. Sensation intact, DTR normal. Strength 5/5 in all 4.  Psychiatric: She is confused, disoriented, able to follow commands however.   Labs on Admission: I have personally reviewed following labs and imaging studies  CBC:  Recent Labs Lab 09/06/15 1211  WBC 7.4  HGB 13.5  HCT 39.2  MCV 96.1  PLT 263   Basic Metabolic Panel:  Recent Labs Lab 09/06/15 1211  NA 136  K 4.0  CL 103  CO2 24  GLUCOSE 147*  BUN 16  CREATININE 1.11*  CALCIUM 8.8*   GFR: CrCl cannot be calculated (Unknown ideal weight.). Liver Function Tests: No results for input(s): AST, ALT, ALKPHOS, BILITOT, PROT, ALBUMIN in the last 168 hours. No results for input(s): LIPASE, AMYLASE in the last 168 hours. No results for input(s): AMMONIA in the last 168 hours. Coagulation Profile:  Recent Labs Lab 09/06/15 1210  INR 1.34    Cardiac Enzymes:  Recent Labs Lab 09/06/15 1211  CKTOTAL 117   BNP (last 3 results) No results for input(s): PROBNP in the last 8760 hours. HbA1C: No results for input(s): HGBA1C in the last 72 hours. CBG:  Recent Labs Lab 09/06/15 1203  GLUCAP 157*   Lipid Profile: No results for input(s): CHOL, HDL, LDLCALC, TRIG, CHOLHDL, LDLDIRECT in the last 72 hours. Thyroid Function Tests: No results for input(s): TSH, T4TOTAL, FREET4, T3FREE, THYROIDAB in the last 72 hours. Anemia Panel: No results for input(s): VITAMINB12, FOLATE, FERRITIN, TIBC, IRON, RETICCTPCT in the last 72 hours. Urine analysis:    Component Value Date/Time   COLORURINE YELLOW 09/06/2015 1410   APPEARANCEUR CLOUDY* 09/06/2015 1410   LABSPEC 1.021 09/06/2015 1410   PHURINE 6.5 09/06/2015 1410   GLUCOSEU NEGATIVE 09/06/2015 1410   HGBUR NEGATIVE 09/06/2015 1410   BILIRUBINUR SMALL* 09/06/2015 1410   KETONESUR 15* 09/06/2015 1410   PROTEINUR NEGATIVE 09/06/2015 1410   UROBILINOGEN 1.0 10/13/2014 0332   NITRITE NEGATIVE 09/06/2015 1410   LEUKOCYTESUR MODERATE* 09/06/2015 1410   Sepsis Labs: !!!!!!!!!!!!!!!!!!!!!!!!!!!!!!!!!!!!!!!!!!!! @LABRCNTIP (procalcitonin:4,lacticidven:4) )No results found for this or any previous visit (from the past 240 hour(s)).   Radiological Exams on Admission: Dg Chest 2 View  09/06/2015  CLINICAL DATA:  Fall. EXAM: CHEST  2 VIEW COMPARISON:  October 11, 2014. FINDINGS: Stable cardiomediastinal silhouette. No pneumothorax or pleural effusion is noted. No acute pulmonary disease is noted. Bony thorax is unremarkable. IMPRESSION: No active cardiopulmonary disease. Electronically Signed   By: Lupita RaiderJames  Green Jr, M.D.   On: 09/06/2015 13:06   Ct Head Wo Contrast  09/06/2015  CLINICAL DATA:  Recent fall EXAM: CT HEAD WITHOUT CONTRAST TECHNIQUE: Contiguous axial images were obtained from the base of the skull through the vertex without intravenous contrast. COMPARISON:  01/28/2007  FINDINGS: The bony calvarium is intact. Diffuse atrophic changes are noted. No findings to suggest acute hemorrhage, acute infarction or space-occupying mass lesion is noted. IMPRESSION: Chronic atrophic changes without acute abnormality. Electronically Signed   By: Alcide CleverMark  Lukens M.D.   On: 09/06/2015 12:59    EKG: Independently reviewed.   Assessment/Plan Principal Problem:   FTT (failure to thrive) in adult Active Problems:   HTN (hypertension)   Atrial fibrillation (HCC)   Long term (current) use of anticoagulants   Dementia   UTI (lower urinary tract infection)   Acute encephalopathy  1.  Failure to thrive/functional decline. Connie Scotty CourtBarnette having a history of cognitive impairment, having a fall sometime Saturday with a subsequent functional decline noted by family members. She has been less interactive, increasingly  weak, as well as having worsening confusion. Suspect secondary to urinary tract infection and possibly dehydration. Will provide empiric IV antibody therapy with ceftriaxone along with gentle IV fluids. Consult physical therapy. I think her healthcare needs may have increased and may possibly require skilled nursing facility placement.  2.  Atrial fibrillation with rapid ventricular response. CHADSVasc score of 3.  May have been precipitated by infection and dehydration. On admission found to be in A. fib with RVR having ventricular rates in the 140s. This improved with 10 mg of Cardizem given. He is on chronic anticoagulation with Xarelto 50 mg by mouth daily which will be continued. Will also continue beta blocker and calcium channel blocker.  3.  Urinary tract infection. Suspect this precipitated functional decline. Urinalysis revealed the presence of many bacteria and white blood cells. We'll treat with empiric IV ceftriaxone 1 g every 24 hours. Follow-up on urine cultures.  3.  Hypertension. Continue metoprolol 25 mg by mouth twice a day and Cardizem 20 mg by mouth  daily   DVT prophylaxis: She is fully anticoagulated Code Status: I spoke with her daughter who is healthcare power of Attorney, she is a full code Family Communication: Spoke with her daughter Disposition Plan: Anticipate she may require greater than 2 nights hospitalization Consults called:  Admission status: Inpatient   Jeralyn Bennett MD [facility] Pager [phone]  If 7PM-7AM, please contact night-coverage [email or web address] Password [email or web address]  09/06/2015, 4:10 PM

## 2015-09-07 DIAGNOSIS — R627 Adult failure to thrive: Secondary | ICD-10-CM

## 2015-09-07 DIAGNOSIS — F0390 Unspecified dementia without behavioral disturbance: Secondary | ICD-10-CM

## 2015-09-07 DIAGNOSIS — G9340 Encephalopathy, unspecified: Secondary | ICD-10-CM

## 2015-09-07 LAB — CBC
HCT: 37.5 % (ref 36.0–46.0)
HEMOGLOBIN: 12.7 g/dL (ref 12.0–15.0)
MCH: 32.6 pg (ref 26.0–34.0)
MCHC: 33.9 g/dL (ref 30.0–36.0)
MCV: 96.2 fL (ref 78.0–100.0)
Platelets: 261 10*3/uL (ref 150–400)
RBC: 3.9 MIL/uL (ref 3.87–5.11)
RDW: 13.2 % (ref 11.5–15.5)
WBC: 6.9 10*3/uL (ref 4.0–10.5)

## 2015-09-07 LAB — BASIC METABOLIC PANEL
Anion gap: 9 (ref 5–15)
BUN: 11 mg/dL (ref 6–20)
CALCIUM: 8.2 mg/dL — AB (ref 8.9–10.3)
CHLORIDE: 105 mmol/L (ref 101–111)
CO2: 23 mmol/L (ref 22–32)
CREATININE: 0.9 mg/dL (ref 0.44–1.00)
GFR calc Af Amer: >60 mL/min (ref >60)
GFR calc non Af Amer: 55 mL/min — ABNORMAL LOW (ref >60)
GLUCOSE: 99 mg/dL (ref 65–99)
Potassium: 3.4 mmol/L — ABNORMAL LOW (ref 3.5–5.1)
SODIUM: 137 mmol/L (ref 135–145)

## 2015-09-07 LAB — T4, FREE: FREE T4: 1.18 ng/dL — AB (ref 0.61–1.12)

## 2015-09-07 LAB — TSH: TSH: 2.338 u[IU]/mL (ref 0.350–4.500)

## 2015-09-07 LAB — VITAMIN B12: Vitamin B-12: 157 pg/mL — ABNORMAL LOW (ref 180–914)

## 2015-09-07 MED ORDER — METOPROLOL TARTRATE 50 MG PO TABS
50.0000 mg | ORAL_TABLET | Freq: Two times a day (BID) | ORAL | Status: DC
  Administered 2015-09-07 – 2015-09-11 (×8): 50 mg via ORAL
  Filled 2015-09-07 (×8): qty 1

## 2015-09-07 MED ORDER — SODIUM CHLORIDE 0.9 % IV SOLN: INTRAVENOUS | Status: DC

## 2015-09-07 MED ORDER — HALOPERIDOL LACTATE 5 MG/ML IJ SOLN
1.0000 mg | Freq: Once | INTRAMUSCULAR | Status: AC
  Administered 2015-09-07: 1 mg via INTRAVENOUS
  Filled 2015-09-07: qty 1

## 2015-09-07 MED ORDER — DILTIAZEM HCL ER COATED BEADS 180 MG PO CP24: 180.0000 mg | ORAL_CAPSULE | Freq: Every day | ORAL | Status: DC

## 2015-09-07 MED ORDER — DILTIAZEM HCL ER COATED BEADS 120 MG PO CP24
120.0000 mg | ORAL_CAPSULE | Freq: Once | ORAL | Status: AC
  Administered 2015-09-07: 120 mg via ORAL
  Filled 2015-09-07: qty 1

## 2015-09-07 NOTE — Progress Notes (Signed)
PT Cancellation Note  Patient Details Name: [name] MRN: [id number] DOB: November 10, 1922   Cancelled Treatment:    Reason Eval/Treat Not Completed: Medical issues which prohibited therapy (in afib)check back when medically stable.   Rada HayHill, Maria Gallicchio [name] 09/07/2015, 10:25 AM Blanchard KelchKaren Orah Sonnen PT [phone]

## 2015-09-07 NOTE — Progress Notes (Signed)
PROGRESS NOTE    [name]  ZOX:[id number] DOB: 1922-10-02 DOA: 09/06/2015 PCP: Astrid Divine, MD Brief Narrative:[name] is a 82 y.o. female with medical history significant  For mild dementia who currently lives at home alone with family members nearby.Daughter states that last Friday she was seen to be in her usual state health. They called her on Saturday and did not answer the phone for which they went over that evening the check on her found her on the floor in the living room down. She was able to ambulate and they took her back to their house. Over the last 48 hours she has had a functional decline becoming increasingly confused, disoriented, having generalized weakness, not functioning at her baseline. In the emergency department she was found to be in A. fib with RVR, that responded to IV diltiazem administered in the emergency department. A urinalysis showed the presence of urinary tract infection  Assessment & Plan:  1. Failure to thrive/functional decline. [name] having a history of Mild dementia, having a fall sometime Saturday with a subsequent functional decline noted by family members.  -Suspect secondary to urinary tract infection and possibly dehydration. -continue Abx, IVF today, PT/OT eval -improving  2. Atrial fibrillation with rapid ventricular response. - CHADSVasc score of 3. May have been precipitated by infection and dehydration.  - increase Cardizem and Metoprolol dose - continue  Xarelto  3. Urinary tract infection. -continue  IV ceftriaxone - Follow-up on urine cultures.  4. Hypertension.  -see above  5. Mild Dementia -stable  DVT prophylaxis: on xarelto Code Status:Full Code Family Communication: called and d/w Daughter Disposition Plan: may need SNF   Antimicrobials: rocephin   Subjective: No complaints, " anxious to be here"  Objective: Filed Vitals:   09/06/15 1900 09/06/15 2214 09/07/15 0549 09/07/15  0732  BP: 156/77 155/75 146/67   Pulse: 113 65 153 126  Temp:  97.7 F (36.5 C) 98.2 F (36.8 C)   TempSrc:  Oral Oral   Resp:  18 18   Height:      Weight:      SpO2:  96% 97%     Intake/Output Summary (Last 24 hours) at 09/07/15 1406 Last data filed at 09/07/15 0700  Gross per 24 hour  Intake   1120 ml  Output      0 ml  Net   1120 ml   Filed Weights   09/06/15 1715  Weight: 50 kg (110 lb 3.7 oz)    Examination:  General exam: Appears calm and comfortable , no distress, oriented to self and place only Respiratory system: Clear to auscultation. Respiratory effort normal. Cardiovascular system: S1 & S2 heard, RRR. No JVD, murmurs, rubs, gallops or clicks. No pedal edema. Gastrointestinal system: Abdomen is nondistended, soft and nontender. No organomegaly or masses felt. Normal bowel sounds  Central nervous system: Alert and oriented to place, person. No focal neurological deficits. Extremities: Symmetric 5 x 5 power. Skin: No rashes, lesions or ulcers Psychiatry: appropriate for the most part    Data Reviewed: I have personally reviewed following labs and imaging studies  CBC:  Recent Labs Lab 09/06/15 1211 09/07/15 0424  WBC 7.4 6.9  HGB 13.5 12.7  HCT 39.2 37.5  MCV 96.1 96.2  PLT 263 261   Basic Metabolic Panel:  Recent Labs Lab 09/06/15 1211 09/07/15 0424  NA 136 137  K 4.0 3.4*  CL 103 105  CO2 24 23  GLUCOSE 147* 99  BUN 16  11  CREATININE 1.11* 0.90  CALCIUM 8.8* 8.2*   GFR: Estimated Creatinine Clearance: 32.8 mL/min (by C-G formula based on Cr of 0.9). Liver Function Tests: No results for input(s): AST, ALT, ALKPHOS, BILITOT, PROT, ALBUMIN in the last 168 hours. No results for input(s): LIPASE, AMYLASE in the last 168 hours. No results for input(s): AMMONIA in the last 168 hours. Coagulation Profile:  Recent Labs Lab 09/06/15 1210  INR 1.34   Cardiac Enzymes:  Recent Labs Lab 09/06/15 1211  CKTOTAL 117   BNP (last 3  results) No results for input(s): PROBNP in the last 8760 hours. HbA1C: No results for input(s): HGBA1C in the last 72 hours. CBG:  Recent Labs Lab 09/06/15 1203  GLUCAP 157*   Lipid Profile: No results for input(s): CHOL, HDL, LDLCALC, TRIG, CHOLHDL, LDLDIRECT in the last 72 hours. Thyroid Function Tests:  Recent Labs  09/07/15 1059  TSH 2.338   Anemia Panel: No results for input(s): VITAMINB12, FOLATE, FERRITIN, TIBC, IRON, RETICCTPCT in the last 72 hours. Urine analysis:    Component Value Date/Time   COLORURINE YELLOW 09/06/2015 1410   APPEARANCEUR CLOUDY* 09/06/2015 1410   LABSPEC 1.021 09/06/2015 1410   PHURINE 6.5 09/06/2015 1410   GLUCOSEU NEGATIVE 09/06/2015 1410   HGBUR NEGATIVE 09/06/2015 1410   BILIRUBINUR SMALL* 09/06/2015 1410   KETONESUR 15* 09/06/2015 1410   PROTEINUR NEGATIVE 09/06/2015 1410   UROBILINOGEN 1.0 10/13/2014 0332   NITRITE NEGATIVE 09/06/2015 1410   LEUKOCYTESUR MODERATE* 09/06/2015 1410   Sepsis Labs: @LABRCNTIP (procalcitonin:4,lacticidven:4)  )No results found for this or any previous visit (from the past 240 hour(s)).       Radiology Studies: Dg Chest 2 View  09/06/2015  CLINICAL DATA:  Fall. EXAM: CHEST  2 VIEW COMPARISON:  October 11, 2014. FINDINGS: Stable cardiomediastinal silhouette. No pneumothorax or pleural effusion is noted. No acute pulmonary disease is noted. Bony thorax is unremarkable. IMPRESSION: No active cardiopulmonary disease. Electronically Signed   By: Lupita RaiderJames  Green Jr, M.D.   On: 09/06/2015 13:06   Ct Head Wo Contrast  09/06/2015  CLINICAL DATA:  Recent fall EXAM: CT HEAD WITHOUT CONTRAST TECHNIQUE: Contiguous axial images were obtained from the base of the skull through the vertex without intravenous contrast. COMPARISON:  01/28/2007 FINDINGS: The bony calvarium is intact. Diffuse atrophic changes are noted. No findings to suggest acute hemorrhage, acute infarction or space-occupying mass lesion is noted.  IMPRESSION: Chronic atrophic changes without acute abnormality. Electronically Signed   By: Alcide CleverMark  Lukens M.D.   On: 09/06/2015 12:59        Scheduled Meds: . cefTRIAXone (ROCEPHIN)  IV  1 g Intravenous Q24H  . [START ON 09/08/2015] diltiazem  180 mg Oral Daily  . metoprolol tartrate  25 mg Oral BID  . pantoprazole  40 mg Oral Daily  . Rivaroxaban  15 mg Oral Q supper  . sodium chloride flush  3 mL Intravenous Q12H   Continuous Infusions: . sodium chloride 75 mL/hr at 09/07/15 0548     LOS: 1 day    Time spent: 35min    Zannie CovePreetha Tayden Duran, MD [facility] Pager [phone]  If 7PM-7AM, please contact night-coverage [email or web address] Password [email or web address] 09/07/2015, 2:06 PM

## 2015-09-08 LAB — CBC
HEMATOCRIT: 39.1 % (ref 36.0–46.0)
HEMOGLOBIN: 13.5 g/dL (ref 12.0–15.0)
MCH: 32.8 pg (ref 26.0–34.0)
MCHC: 34.5 g/dL (ref 30.0–36.0)
MCV: 94.9 fL (ref 78.0–100.0)
Platelets: 304 10*3/uL (ref 150–400)
RBC: 4.12 MIL/uL (ref 3.87–5.11)
RDW: 13.1 % (ref 11.5–15.5)
WBC: 9.9 10*3/uL (ref 4.0–10.5)

## 2015-09-08 LAB — BASIC METABOLIC PANEL
Anion gap: 9 (ref 5–15)
BUN: 8 mg/dL (ref 6–20)
CHLORIDE: 108 mmol/L (ref 101–111)
CO2: 21 mmol/L — AB (ref 22–32)
CREATININE: 0.78 mg/dL (ref 0.44–1.00)
Calcium: 8.9 mg/dL (ref 8.9–10.3)
GFR calc non Af Amer: >60 mL/min (ref >60)
GLUCOSE: 168 mg/dL — AB (ref 65–99)
Potassium: 3.4 mmol/L — ABNORMAL LOW (ref 3.5–5.1)
Sodium: 138 mmol/L (ref 135–145)

## 2015-09-08 MED ORDER — DILTIAZEM HCL ER COATED BEADS 240 MG PO CP24
240.0000 mg | ORAL_CAPSULE | Freq: Every day | ORAL | Status: DC
Start: 2015-09-08 — End: 2015-09-08
  Administered 2015-09-08: 240 mg via ORAL
  Filled 2015-09-08: qty 1

## 2015-09-08 MED ORDER — METOPROLOL TARTRATE 5 MG/5ML IV SOLN
5.0000 mg | Freq: Once | INTRAVENOUS | Status: AC
  Administered 2015-09-08: 5 mg via INTRAVENOUS
  Filled 2015-09-08: qty 5

## 2015-09-08 MED ORDER — DILTIAZEM HCL 100 MG IV SOLR
10.0000 mg/h | INTRAVENOUS | Status: DC
  Administered 2015-09-08: 10 mg/h via INTRAVENOUS
  Filled 2015-09-08: qty 100

## 2015-09-08 MED ORDER — DILTIAZEM HCL ER COATED BEADS 180 MG PO CP24
180.0000 mg | ORAL_CAPSULE | Freq: Every day | ORAL | Status: DC
  Administered 2015-09-08: 180 mg via ORAL
  Filled 2015-09-08: qty 1

## 2015-09-08 MED ORDER — SODIUM CHLORIDE 0.9 % IV BOLUS (SEPSIS)
1000.0000 mL | Freq: Once | INTRAVENOUS | Status: AC
  Administered 2015-09-08: 1000 mL via INTRAVENOUS

## 2015-09-08 MED ORDER — DILTIAZEM HCL ER COATED BEADS 240 MG PO CP24
240.0000 mg | ORAL_CAPSULE | Freq: Every day | ORAL | Status: DC
  Administered 2015-09-09 – 2015-09-10 (×2): 240 mg via ORAL
  Filled 2015-09-08 (×2): qty 1

## 2015-09-08 NOTE — Progress Notes (Signed)
Pt with elevated HR and bp. NP on call made aware. See orders for saline bolus, metoprolol and cardizem. VWilliams,rn.

## 2015-09-08 NOTE — Progress Notes (Signed)
PT Cancellation Note  Patient Details Name: [name] MRN: [id number] DOB: 04/05/1923   Cancelled Treatment:    Reason Eval/Treat Not Completed: Medical issues which prohibited therapy (afib, tachycardia)   Madora Barletta,KATHrine E 09/08/2015, 9:58 AM Zenovia JarredKati Ellanore Vanhook, PT, DPT 09/08/2015 Pager: [phone]

## 2015-09-08 NOTE — Clinical Social Work Placement (Signed)
   CLINICAL SOCIAL WORK PLACEMENT  NOTE  Date:  09/08/2015  Patient Details  Name: [name] MRN: [id number] Date of Birth: Jul 25, 1922  Clinical Social Work is seeking post-discharge placement for this patient at the Skilled  Nursing Facility level of care (*CSW will initial, date and re-position this form in  chart as items are completed):  Yes   Patient/family provided with [facility] Clinical Social Work Department's list of facilities offering this level of care within the geographic area requested by the patient (or if unable, by the patient's family).  Yes   Patient/family informed of their freedom to choose among providers that offer the needed level of care, that participate in Medicare, Medicaid or managed care program needed by the patient, have an available bed and are willing to accept the patient.  Yes   Patient/family informed of [facility]'s ownership interest [facility] and [facility], as well as of the fact that they are under no obligation to receive care at these facilities.  PASRR submitted to EDS on 09/08/15     PASRR number received on 09/08/15     Existing PASRR number confirmed on       FL2 transmitted to all facilities in geographic area requested by pt/family on 09/08/15     FL2 transmitted to all facilities within larger geographic area on       Patient informed that his/her managed care company has contracts with or will negotiate with certain facilities, including the following:            Patient/family informed of bed offers received.  Patient chooses bed at       Physician recommends and patient chooses bed at      Patient to be transferred to   on  .  Patient to be transferred to facility by       Patient family notified on   of transfer.  Name of family member notified:        PHYSICIAN       Additional Comment:    _______________________________________________ Arlyss RepressHarrison, Anairis Knick F, LCSW 09/08/2015, 2:15 PM

## 2015-09-08 NOTE — [place] FL2 (Signed)
[place] MEDICAID FL2 LEVEL OF CARE SCREENING TOOL     IDENTIFICATION  Patient Name: [name] Birthdate: 23-Feb-1923 Sex: female Admission Date (Current Location): 09/06/2015  [facility] and [place] Number:  Producer, television/film/video and Address:  [facility],  [address], [place] [address]      Provider Number: [id number]  Attending Physician Name and Address:  Zannie Cove, MD  Relative Name and Phone Number:       Current Level of Care: Hospital Recommended Level of Care: Skilled Nursing Facility Prior Approval Number:    Date Approved/Denied:   PASRR Number: [id number]  Discharge Plan: SNF    Current Diagnoses: Patient Active Problem List   Diagnosis Date Noted  . FTT (failure to thrive) in adult 09/06/2015  . UTI (lower urinary tract infection) 09/06/2015  . Acute encephalopathy 09/06/2015  . Esophageal stricture   . Esophageal obstruction due to food impaction 10/11/2014  . Dysphagia 10/11/2014  . Essential hypertension 10/11/2014  . Dementia 10/11/2014  . Long term (current) use of anticoagulants 03/03/2013  . Acute blood loss anemia 10/20/2012  . Atrial fibrillation (HCC) 08/22/2012    Class: Acute  . Ankle fracture, right 08/18/2012  . Cellulitis of right foot 08/18/2012  . HTN (hypertension) 08/18/2012  . GERD (gastroesophageal reflux disease) 08/18/2012  . Osteoporosis, unspecified 08/18/2012  . Depression 08/18/2012  . ESOPHAGEAL STRICTURE 09/24/2007  . GERD 09/24/2007    Orientation RESPIRATION BLADDER Height & Weight     Self, Situation  Normal Continent Weight: 110 lb 3.7 oz (50 kg) Height:   (160 cm)  BEHAVIORAL SYMPTOMS/MOOD NEUROLOGICAL BOWEL NUTRITION STATUS      Continent Diet (Regular)  AMBULATORY STATUS COMMUNICATION OF NEEDS Skin   Extensive Assist Verbally Normal                       Personal Care Assistance Level of Assistance  Bathing, Dressing Bathing Assistance: Limited assistance    Dressing Assistance: Limited assistance     Functional Limitations Info             SPECIAL CARE FACTORS FREQUENCY  PT (By licensed PT), OT (By licensed OT)     PT Frequency: 5 OT Frequency: 5            Contractures      Additional Factors Info  Code Status, Allergies Code Status Info: Fullcode Allergies Info: Codeine           Current Medications (09/08/2015):  This is the current hospital active medication list Current Facility-Administered Medications  Medication Dose Route Frequency Provider Last Rate Last Dose  . acetaminophen (TYLENOL) tablet 650 mg  650 mg Oral Q6H PRN Jeralyn Bennett, MD   650 mg at 09/08/15 5621   Or  . acetaminophen (TYLENOL) suppository 650 mg  650 mg Rectal Q6H PRN Jeralyn Bennett, MD      . cefTRIAXone (ROCEPHIN) 1 g in dextrose 5 % 50 mL IVPB  1 g Intravenous Q24H Jeralyn Bennett, MD   1 g at 09/07/15 1636  . diltiazem (CARDIZEM) 100 mg in dextrose 5 % 100 mL (1 mg/mL) infusion  10 mg/hr Intravenous Titrated Zannie Cove, MD 10 mL/hr at 09/08/15 1028 10 mg/hr at 09/08/15 1028  . metoprolol (LOPRESSOR) injection 5 mg  5 mg Intravenous Q2H PRN Jeralyn Bennett, MD   5 mg at 09/07/15 2335  . metoprolol (LOPRESSOR) tablet 50 mg  50 mg Oral BID Zannie Cove, MD  50 mg at 09/08/15 0924  . ondansetron (ZOFRAN) tablet 4 mg  4 mg Oral Q6H PRN Jeralyn BennettEzequiel Zamora, MD   4 mg at 09/08/15 0924   Or  . ondansetron (ZOFRAN) injection 4 mg  4 mg Intravenous Q6H PRN Jeralyn BennettEzequiel Zamora, MD      . pantoprazole (PROTONIX) EC tablet 40 mg  40 mg Oral Daily Jeralyn BennettEzequiel Zamora, MD   40 mg at 09/08/15 0923  . Rivaroxaban (XARELTO) tablet 15 mg  15 mg Oral Q supper Jeralyn BennettEzequiel Zamora, MD   15 mg at 09/07/15 1636  . sodium chloride flush (NS) 0.9 % injection 3 mL  3 mL Intravenous Q12H Jeralyn BennettEzequiel Zamora, MD   3 mL at 09/08/15 [id number]     Discharge Medications: Please see discharge summary for a list of discharge medications.  Relevant Imaging Results:  Relevant Lab  Results:   Additional Information SSN: [id number]  Arlyss RepressHarrison, Zionah Criswell F, LCSW

## 2015-09-08 NOTE — Clinical Social Work Note (Signed)
Clinical Social Work Assessment  Patient Details  Name: [name] MRN: [id number] Date of Birth: December 23, 1922  Date of referral:  09/08/15               Reason for consult:  Facility Placement                Permission sought to share information with:  Facility Industrial/product designerContact Representative Permission granted to share information::  Yes, Verbal Permission Granted  Name::        Agency::     Relationship::     Contact Information:     Housing/Transportation Living arrangements for the past 2 months:  Skilled Nursing Facility Source of Information:  Adult Children Patient Interpreter Needed:  None Criminal Activity/Legal Involvement Pertinent to Current Situation/Hospitalization:  No - Comment as needed Significant Relationships:  Adult Children Lives with:  Self Do you feel safe going back to the place where you live?  No Need for family participation in patient care:  Yes (Comment)  Care giving concerns:  CSW received consult from Dr. Jomarie LongsJoseph that patient will need SNF at discharge.    Social Worker assessment / plan:  CSW spoke with patient's daughter, [name] that patient was living alone prior to hospitalization and would greatly benefit from going to SNF. [facility] and daughter would prefer that she return there if possible. CSW spoke with [name] at [facility] to make aware, awaiting call back re: bed offer/availability.   Employment status:  Retired Health and safety inspectornsurance information:  Medicare PT Recommendations:  Not assessed at this time Information / Referral to community resources:  Skilled Nursing Facility  Patient/Family's Response to care:  CSW spoke with patient's daughter re: discharge plans, [facility] and [facility] once [facility] has completed rehab stay.   Patient/Family's Understanding of and Emotional Response to Diagnosis, Current Treatment, and  Prognosis:  Patient's daughter is concerned about patient's heart rate and ability to participate with therapy.   Emotional Assessment Appearance:  Appears stated age Attitude/Demeanor/Rapport:    Affect (typically observed):    Orientation:  Oriented to Self, Oriented to Place Alcohol / Substance use:    Psych involvement (Current and /or in the community):     Discharge Needs  Concerns to be addressed:    Readmission within the last 30 days:    Current discharge risk:    Barriers to Discharge:      Arlyss RepressHarrison, Karan Inclan F, LCSW 09/08/2015, 2:11 PM

## 2015-09-08 NOTE — Progress Notes (Signed)
PROGRESS NOTE    [name]  YQM:[id number] DOB: 11/25/1922 DOA: 09/06/2015 PCP: Astrid Divine, MD Brief Narrative:[name] is a 82 y.o. female with medical history significant  For mild dementia who currently lives at home alone with family members nearby.Daughter states that last Friday she was seen to be in her usual state health. They called her on Saturday and did not answer the phone for which they went over that evening the check on her found her on the floor in the living room down. She was able to ambulate and they took her back to their house. Over the last 48 hours she has had a functional decline becoming increasingly confused, disoriented, having generalized weakness, not functioning at her baseline. In the emergency department she was found to be in A. fib with RVR, that responded to IV diltiazem administered in the emergency department. A urinalysis showed the presence of urinary tract infection  Assessment & Plan:  1. Failure to thrive/functional decline.  history of Mild dementia, s/p fall sometime Saturday with a subsequent functional decline noted by family members.  -Suspect secondary to urinary tract infection and possibly dehydration. -continue Abx, IVF today, PT/OT eval -improving  2. Atrial fibrillation with rapid ventricular response. - CHADSVasc score of 3. May have been precipitated by infection and dehydration.  - increased Cardizem and Metoprolol dose - continue  Xarelto -started Cardizem gtt this am  3. Urinary tract infection. -continue  IV ceftriaxone -Urine cultures with Enterococcus, FU sensitivity  4. Hypertension.  -see above  5. Mild Dementia -stable  DVT prophylaxis: on xarelto Code Status:Full Code Family Communication: called and d/w Daughter 5/17 and d/w son in law at bedside Disposition Plan: will likely need SNF   Antimicrobials: rocephin   Subjective: No complaints, " anxious to be  here"  Objective: Filed Vitals:   09/08/15 0050 09/08/15 0248 09/08/15 0648 09/08/15 1350  BP: 179/113 150/89 159/106 105/59  Pulse: 146 111 61 47  Temp:   98.6 F (37 C) 98.6 F (37 C)  TempSrc:   Oral Oral  Resp:    18  Height:      Weight:      SpO2:   97% 96%    Intake/Output Summary (Last 24 hours) at 09/08/15 1648 Last data filed at 09/08/15 0900  Gross per 24 hour  Intake    390 ml  Output      0 ml  Net    390 ml   Filed Weights   09/06/15 1715  Weight: 50 kg (110 lb 3.7 oz)    Examination:  General exam: Appears calm and comfortable , no distress, oriented to self and place only Respiratory system: Clear to auscultation. Respiratory effort normal. Cardiovascular system: S1 & S2 heard, RRR. No JVD, murmurs, rubs, gallops or clicks. No pedal edema. Gastrointestinal system: Abdomen is nondistended, soft and nontender. No organomegaly or masses felt. Normal bowel sounds  Central nervous system: Alert and oriented to place, person. No focal neurological deficits. Extremities: Symmetric 5 x 5 power. Skin: No rashes, lesions or ulcers Psychiatry: appropriate for the most part    Data Reviewed: I have personally reviewed following labs and imaging studies  CBC:  Recent Labs Lab 09/06/15 1211 09/07/15 0424 09/08/15 0443  WBC 7.4 6.9 9.9  HGB 13.5 12.7 13.5  HCT 39.2 37.5 39.1  MCV 96.1 96.2 94.9  PLT 263 261 304   Basic Metabolic Panel:  Recent Labs Lab 09/06/15 1211 09/07/15 0424 09/08/15 0443  NA 136 137 138  K 4.0 3.4* 3.4*  CL 103 105 108  CO2 24 23 21*  GLUCOSE 147* 99 168*  BUN 16 11 8   CREATININE 1.11* 0.90 0.78  CALCIUM 8.8* 8.2* 8.9   GFR: Estimated Creatinine Clearance: 36.9 mL/min (by C-G formula based on Cr of 0.78). Liver Function Tests: No results for input(s): AST, ALT, ALKPHOS, BILITOT, PROT, ALBUMIN in the last 168 hours. No results for input(s): LIPASE, AMYLASE in the last 168 hours. No results for input(s): AMMONIA in  the last 168 hours. Coagulation Profile:  Recent Labs Lab 09/06/15 1210  INR 1.34   Cardiac Enzymes:  Recent Labs Lab 09/06/15 1211  CKTOTAL 117   BNP (last 3 results) No results for input(s): PROBNP in the last 8760 hours. HbA1C: No results for input(s): HGBA1C in the last 72 hours. CBG:  Recent Labs Lab 09/06/15 1203  GLUCAP 157*   Lipid Profile: No results for input(s): CHOL, HDL, LDLCALC, TRIG, CHOLHDL, LDLDIRECT in the last 72 hours. Thyroid Function Tests:  Recent Labs  09/07/15 1059  TSH 2.338  FREET4 1.18*   Anemia Panel:  Recent Labs  09/07/15 1059  VITAMINB12 157*   Urine analysis:    Component Value Date/Time   COLORURINE YELLOW 09/06/2015 1410   APPEARANCEUR CLOUDY* 09/06/2015 1410   LABSPEC 1.021 09/06/2015 1410   PHURINE 6.5 09/06/2015 1410   GLUCOSEU NEGATIVE 09/06/2015 1410   HGBUR NEGATIVE 09/06/2015 1410   BILIRUBINUR SMALL* 09/06/2015 1410   KETONESUR 15* 09/06/2015 1410   PROTEINUR NEGATIVE 09/06/2015 1410   UROBILINOGEN 1.0 10/13/2014 0332   NITRITE NEGATIVE 09/06/2015 1410   LEUKOCYTESUR MODERATE* 09/06/2015 1410   Sepsis Labs: @LABRCNTIP (procalcitonin:4,lacticidven:4)  ) Recent Results (from the past 240 hour(s))  Urine culture     Status: Abnormal (Preliminary result)   Collection Time: 09/06/15  2:10 PM  Result Value Ref Range Status   Specimen Description URINE, CLEAN CATCH  Final   Special Requests NONE  Final   Culture >=100,000 COLONIES/mL ENTEROCOCCUS SPECIES (A)  Final   Report Status PENDING  Incomplete  Urine culture     Status: Abnormal (Preliminary result)   Collection Time: 09/07/15  1:40 AM  Result Value Ref Range Status   Specimen Description URINE, CLEAN CATCH  Final   Special Requests NONE  Final   Culture 80,000 COLONIES/mL ENTEROCOCCUS SPECIES (A)  Final   Report Status PENDING  Incomplete         Radiology Studies: No results found.      Scheduled Meds: . cefTRIAXone (ROCEPHIN)  IV   1 g Intravenous Q24H  . [START ON 09/09/2015] diltiazem  240 mg Oral Daily  . metoprolol tartrate  50 mg Oral BID  . pantoprazole  40 mg Oral Daily  . Rivaroxaban  15 mg Oral Q supper  . sodium chloride flush  3 mL Intravenous Q12H   Continuous Infusions:     LOS: 2 days    Time spent: 35min    Zannie CovePreetha Indigo Barbian, MD [facility] Pager [phone]  If 7PM-7AM, please contact night-coverage [email or web address] Password [email or web address] 09/08/2015, 4:48 PM

## 2015-09-09 LAB — BASIC METABOLIC PANEL
Anion gap: 7 (ref 5–15)
BUN: 8 mg/dL (ref 6–20)
CO2: 23 mmol/L (ref 22–32)
CREATININE: 0.87 mg/dL (ref 0.44–1.00)
Calcium: 8.7 mg/dL — ABNORMAL LOW (ref 8.9–10.3)
Chloride: 105 mmol/L (ref 101–111)
GFR calc Af Amer: >60 mL/min (ref >60)
GFR, EST NON AFRICAN AMERICAN: 57 mL/min — AB (ref >60)
GLUCOSE: 115 mg/dL — AB (ref 65–99)
Potassium: 2.7 mmol/L — CL (ref 3.5–5.1)
SODIUM: 135 mmol/L (ref 135–145)

## 2015-09-09 LAB — CBC
HCT: 34.8 % — ABNORMAL LOW (ref 36.0–46.0)
Hemoglobin: 12.2 g/dL (ref 12.0–15.0)
MCH: 32.5 pg (ref 26.0–34.0)
MCHC: 35.1 g/dL (ref 30.0–36.0)
MCV: 92.8 fL (ref 78.0–100.0)
PLATELETS: 249 10*3/uL (ref 150–400)
RBC: 3.75 MIL/uL — ABNORMAL LOW (ref 3.87–5.11)
RDW: 12.9 % (ref 11.5–15.5)
WBC: 7.2 10*3/uL (ref 4.0–10.5)

## 2015-09-09 LAB — C DIFFICILE QUICK SCREEN W PCR REFLEX
C DIFFICLE (CDIFF) ANTIGEN: NEGATIVE
C Diff interpretation: NEGATIVE
C Diff toxin: NEGATIVE

## 2015-09-09 LAB — URINE CULTURE
Culture: >=100000 — AB
Culture: 80000 — AB

## 2015-09-09 MED ORDER — POTASSIUM CHLORIDE 10 MEQ/100ML IV SOLN
10.0000 meq | INTRAVENOUS | Status: AC
  Administered 2015-09-09 (×4): 10 meq via INTRAVENOUS
  Filled 2015-09-09 (×4): qty 100

## 2015-09-09 MED ORDER — LEVOFLOXACIN 500 MG PO TABS
250.0000 mg | ORAL_TABLET | Freq: Every day | ORAL | Status: DC
  Administered 2015-09-09: 250 mg via ORAL
  Filled 2015-09-09: qty 1

## 2015-09-09 MED ORDER — POTASSIUM CHLORIDE CRYS ER 20 MEQ PO TBCR
40.0000 meq | EXTENDED_RELEASE_TABLET | Freq: Once | ORAL | Status: AC
  Administered 2015-09-09: 40 meq via ORAL
  Filled 2015-09-09: qty 2

## 2015-09-09 MED ORDER — AMOXICILLIN 250 MG PO CAPS
500.0000 mg | ORAL_CAPSULE | Freq: Three times a day (TID) | ORAL | Status: DC
  Administered 2015-09-09 – 2015-09-11 (×6): 500 mg via ORAL
  Filled 2015-09-09 (×6): qty 2

## 2015-09-09 NOTE — Clinical Social Work Note (Signed)
CSW [facility] pt's [facility] and [facility] offer.  [facility].  CSW will continue to follow and assist with d/c planning needs.  [place] VernonLynn Anastasya Jewell, KentuckyLCSW [phone]

## 2015-09-09 NOTE — Progress Notes (Signed)
CRITICAL VALUE ALERT  Critical value received:  Potassium 2.7  Date of notification: 09/09/15  Time of notification:  0701  Critical value read back:yes  Nurse who received alert: Karie SchwalbeJoanne Kalis Friese  MD notified (1st page):  yes  Time of first page:  [phone]

## 2015-09-09 NOTE — Progress Notes (Signed)
PROGRESS NOTE    [name]  ZOX:[id number] DOB: August 04, 1922 DOA: 09/06/2015 PCP: Astrid Divine, MD Brief Narrative:[name] is a 82 y.o. female with medical history significant  For mild dementia who currently lives at home alone with family members nearby.Daughter states that last Friday she was seen to be in her usual state health. They called her on Saturday and did not answer the phone for which they went over that evening the check on her found her on the floor in the living room down. She was able to ambulate and they took her back to their house. Over the last 48 hours she has had a functional decline becoming increasingly confused, disoriented, having generalized weakness, not functioning at her baseline. In the emergency department she was found to be in A. fib with RVR, that responded to IV diltiazem administered in the emergency department. A urinalysis showed the presence of urinary tract infection  Assessment & Plan:  1. Failure to thrive/functional decline.  history of Mild dementia, s/p fall sometime Saturday with a subsequent functional decline noted by family members.  -Suspect secondary to urinary tract infection and possibly dehydration. -improved and close to baseline but deconditioned   2. Atrial fibrillation with rapid ventricular response. - CHADSVasc score of 3. May have been precipitated by infection and dehydration.  - off Cardizem drip now - continue increased dose of Cardizem and Metoprolol - continue  Xarelto  3. Enterococcus Urinary tract infection. -stop IV ceftriaxone, change to Po levaquin for day -Urine cultures with Enterococcus, pansensitive  4. Hypertension.  -see above  5. Mild Dementia -stable  6. Hypokalemia -replace  DVT prophylaxis: on xarelto Code Status:Full Code Family Communication: called and d/w Daughter 5/17 and d/w son in law at bedside Disposition Plan: SNF tomorrow   Antimicrobials:  rocephin   Subjective: No complaints, " anxious to be here"  Objective: Filed Vitals:   09/08/15 1350 09/08/15 2019 09/09/15 0654 09/09/15 0953  BP: 105/59 153/73 137/55 134/68  Pulse: 47 86 75 84  Temp: 98.6 F (37 C) 98 F (36.7 C) 98.4 F (36.9 C)   TempSrc: Oral Oral Oral   Resp: 18 16 16    Height:      Weight:      SpO2: 96% 97%     No intake or output data in the 24 hours ending 09/09/15 1348 Filed Weights   09/06/15 1715  Weight: 50 kg (110 lb 3.7 oz)    Examination:  General exam: Appears calm and comfortable , no distress, oriented to self and place only Respiratory system: Clear to auscultation. Respiratory effort normal. Cardiovascular system: S1 & S2 heard, RRR. No JVD, murmurs, rubs, gallops or clicks. No pedal edema. Gastrointestinal system: Abdomen is nondistended, soft and nontender. No organomegaly or masses felt. Normal bowel sounds  Central nervous system: Alert and oriented to place, person. No focal neurological deficits. Extremities: Symmetric 5 x 5 power. Skin: No rashes, lesions or ulcers Psychiatry: appropriate for the most part    Data Reviewed: I have personally reviewed following labs and imaging studies  CBC:  Recent Labs Lab 09/06/15 1211 09/07/15 0424 09/08/15 0443 09/09/15 0445  WBC 7.4 6.9 9.9 7.2  HGB 13.5 12.7 13.5 12.2  HCT 39.2 37.5 39.1 34.8*  MCV 96.1 96.2 94.9 92.8  PLT 263 261 304 249   Basic Metabolic Panel:  Recent Labs Lab 09/06/15 1211 09/07/15 0424 09/08/15 0443 09/09/15 0445  NA 136 137 138 135  K 4.0 3.4* 3.4*  2.7*  CL 103 105 108 105  CO2 24 23 21* 23  GLUCOSE 147* 99 168* 115*  BUN 16 11 8 8   CREATININE 1.11* 0.90 0.78 0.87  CALCIUM 8.8* 8.2* 8.9 8.7*   GFR: Estimated Creatinine Clearance: 33.9 mL/min (by C-G formula based on Cr of 0.87). Liver Function Tests: No results for input(s): AST, ALT, ALKPHOS, BILITOT, PROT, ALBUMIN in the last 168 hours. No results for input(s): LIPASE, AMYLASE in  the last 168 hours. No results for input(s): AMMONIA in the last 168 hours. Coagulation Profile:  Recent Labs Lab 09/06/15 1210  INR 1.34   Cardiac Enzymes:  Recent Labs Lab 09/06/15 1211  CKTOTAL 117   BNP (last 3 results) No results for input(s): PROBNP in the last 8760 hours. HbA1C: No results for input(s): HGBA1C in the last 72 hours. CBG:  Recent Labs Lab 09/06/15 1203  GLUCAP 157*   Lipid Profile: No results for input(s): CHOL, HDL, LDLCALC, TRIG, CHOLHDL, LDLDIRECT in the last 72 hours. Thyroid Function Tests:  Recent Labs  09/07/15 1059  TSH 2.338  FREET4 1.18*   Anemia Panel:  Recent Labs  09/07/15 1059  VITAMINB12 157*   Urine analysis:    Component Value Date/Time   COLORURINE YELLOW 09/06/2015 1410   APPEARANCEUR CLOUDY* 09/06/2015 1410   LABSPEC 1.021 09/06/2015 1410   PHURINE 6.5 09/06/2015 1410   GLUCOSEU NEGATIVE 09/06/2015 1410   HGBUR NEGATIVE 09/06/2015 1410   BILIRUBINUR SMALL* 09/06/2015 1410   KETONESUR 15* 09/06/2015 1410   PROTEINUR NEGATIVE 09/06/2015 1410   UROBILINOGEN 1.0 10/13/2014 0332   NITRITE NEGATIVE 09/06/2015 1410   LEUKOCYTESUR MODERATE* 09/06/2015 1410   Sepsis Labs: (procalcitonin:4,lacticidven:4)  ) Recent Results (from the past 240 hour(s))  Urine culture     Status: Abnormal   Collection Time: 09/06/15  2:10 PM  Result Value Ref Range Status   Specimen Description URINE, CLEAN CATCH  Final   Special Requests NONE  Final   Culture >=100,000 COLONIES/mL ENTEROCOCCUS SPECIES (A)  Final   Report Status 09/09/2015 FINAL  Final   Organism ID, Bacteria ENTEROCOCCUS SPECIES (A)  Final      Susceptibility   Enterococcus species - MIC*    AMPICILLIN <=2 SENSITIVE Sensitive     LEVOFLOXACIN 0.5 SENSITIVE Sensitive     NITROFURANTOIN <=16 SENSITIVE Sensitive     VANCOMYCIN 1 SENSITIVE Sensitive     * >=100,000 COLONIES/mL ENTEROCOCCUS SPECIES  Urine culture     Status: Abnormal   Collection Time:  09/07/15  1:40 AM  Result Value Ref Range Status   Specimen Description URINE, CLEAN CATCH  Final   Special Requests NONE  Final   Culture 80,000 COLONIES/mL ENTEROCOCCUS SPECIES (A)  Final   Report Status 09/09/2015 FINAL  Final   Organism ID, Bacteria ENTEROCOCCUS SPECIES (A)  Final      Susceptibility   Enterococcus species - MIC*    AMPICILLIN <=2 SENSITIVE Sensitive     LEVOFLOXACIN 1 SENSITIVE Sensitive     NITROFURANTOIN <=16 SENSITIVE Sensitive     VANCOMYCIN 2 SENSITIVE Sensitive     * 80,000 COLONIES/mL ENTEROCOCCUS SPECIES         Radiology Studies: No results found.      Scheduled Meds: . diltiazem  240 mg Oral Daily  . levofloxacin  250 mg Oral Daily  . metoprolol tartrate  50 mg Oral BID  . pantoprazole  40 mg Oral Daily  . Rivaroxaban  15 mg Oral Q s pper  . sodium  chloride flush  3 mL Intravenous Q12H   Continuous Infusions:     LOS: 3 days    Time spent: 35min    Zannie CovePreetha Drianna Chandran, MD [facility] Pager [phone]  If 7PM-7AM, please contact night-coverage [email or web address] Password [email or web address] 09/09/2015, 1:48 PM

## 2015-09-09 NOTE — Progress Notes (Signed)
PT Cancellation Note  Patient Details Name: Edison NasutiSusie K Brazzle MRN: [id number] DOB: [birth year]   Cancelled Treatment:    Reason Eval/Treat Not Completed: Other (comment) Pt with low K this morning however RN reports she has started IV replacement.  Returned this afternoon and pt having frequent loose BM and tech assisting with hygiene.  Will check back as schedule permits.   Hawley Michel,KATHrine E 09/09/2015, 2:25 PM Zenovia JarredKati Aspen Lawrance, PT, DPT 09/09/2015 Pager: [phone]

## 2015-09-09 NOTE — Care Management Important Message (Signed)
Important Message  Patient Details  Name: [name] MRN: [id number] Date of Birth: August 20, 1922   Medicare Important Message Given:  Yes    Haskell FlirtJamison, Kelly Ranieri 09/09/2015, 9:11 AMImportant Message  Patient Details  Name: [name] MRN: [id number] Date of Birth: August 20, 1922   Medicare Important Message Given:  Yes    Haskell FlirtJamison, Makendra Vigeant 09/09/2015, 9:11 AM

## 2015-09-10 LAB — CBC
HCT: 38.8 % (ref 36.0–46.0)
Hemoglobin: 13.6 g/dL (ref 12.0–15.0)
MCH: 32.7 pg (ref 26.0–34.0)
MCHC: 35.1 g/dL (ref 30.0–36.0)
MCV: 93.3 fL (ref 78.0–100.0)
PLATELETS: 313 10*3/uL (ref 150–400)
RBC: 4.16 MIL/uL (ref 3.87–5.11)
RDW: 13.1 % (ref 11.5–15.5)
WBC: 7.6 10*3/uL (ref 4.0–10.5)

## 2015-09-10 LAB — BASIC METABOLIC PANEL
Anion gap: 7 (ref 5–15)
BUN: 8 mg/dL (ref 6–20)
CALCIUM: 9.1 mg/dL (ref 8.9–10.3)
CO2: 23 mmol/L (ref 22–32)
CREATININE: 0.93 mg/dL (ref 0.44–1.00)
Chloride: 105 mmol/L (ref 101–111)
GFR calc non Af Amer: 53 mL/min — ABNORMAL LOW (ref >60)
Glucose, Bld: 137 mg/dL — ABNORMAL HIGH (ref 65–99)
Potassium: 4.1 mmol/L (ref 3.5–5.1)
Sodium: 135 mmol/L (ref 135–145)

## 2015-09-10 MED ORDER — DILTIAZEM HCL ER COATED BEADS 120 MG PO CP24
120.0000 mg | ORAL_CAPSULE | Freq: Every day | ORAL | Status: AC
  Administered 2015-09-10: 120 mg via ORAL
  Filled 2015-09-10: qty 1

## 2015-09-10 MED ORDER — DILTIAZEM HCL ER COATED BEADS 180 MG PO CP24
300.0000 mg | ORAL_CAPSULE | Freq: Every day | ORAL | Status: DC
  Administered 2015-09-11: 300 mg via ORAL
  Filled 2015-09-10: qty 1

## 2015-09-10 NOTE — Evaluation (Signed)
Physical Therapy Evaluation Patient Details Name: [name] MRN: [id number] DOB: 1922-07-29 Today's Date: 09/10/2015   History of Present Illness  82 y.o. female with h/o a fib, dementia, HTN admitted with FTT, hypokalemia, UTI.   Clinical Impression  Pt admitted with above diagnosis. Pt currently with functional limitations due to the deficits listed below (see PT Problem List). Mod assist for supine to sit and sit to stand, pt ambulated 5' with min A and RW. SNF recommended.  Pt will benefit from skilled PT to increase their independence and safety with mobility to allow discharge to the venue listed below.       Follow Up Recommendations SNF    Equipment Recommendations  None recommended by PT    Recommendations for Other Services       Precautions / Restrictions Precautions Precautions: Fall Precaution Comments: pt not able to provide fall hx due to dementia Restrictions Weight Bearing Restrictions: No      Mobility  Bed Mobility Overal bed mobility: Needs Assistance Bed Mobility: Rolling;Sidelying to Sit Rolling: Mod assist Sidelying to sit: Mod assist       General bed mobility comments: mod A to initiate roll and to raise trunk, VCs technique  Transfers Overall transfer level: Needs assistance Equipment used: Rolling walker (2 wheeled) Transfers: Sit to/from Stand Sit to Stand: Mod assist         General transfer comment: assist to rise, manual cues for hand placement  Ambulation/Gait Ambulation/Gait assistance: Min assist Ambulation Distance (Feet): 5 Feet Assistive device: Rolling walker (2 wheeled) Gait Pattern/deviations: Step-through pattern;Decreased step length - right;Decreased step length - left   Gait velocity interpretation: Below normal speed for age/gender General Gait Details: min A to advance RW and for balance, distance limited by fatigue  Stairs            Wheelchair Mobility    Modified Rankin (Stroke Patients  Only)       Balance Overall balance assessment: Needs assistance   Sitting balance-Leahy Scale: Fair       Standing balance-Leahy Scale: Poor                               Pertinent Vitals/Pain Pain Assessment: No/denies pain    Home Living Family/patient expects to be discharged to:: Skilled nursing facility Living Arrangements: Alone                    Prior Function Level of Independence: Needs assistance         Comments: pt stated she walks with a walker and that her daughter brings her groceries     Hand Dominance        Extremity/Trunk Assessment   Upper Extremity Assessment: Generalized weakness           Lower Extremity Assessment: Generalized weakness (knee ext -4/5 B)      Cervical / Trunk Assessment: Kyphotic  Communication   Communication: No difficulties  Cognition Arousal/Alertness: Awake/alert Behavior During Therapy: WFL for tasks assessed/performed Overall Cognitive Status: No family/caregiver present to determine baseline cognitive functioning (not oriented to date or situation, oriented to self and location)                      General Comments      Exercises        Assessment/Plan    PT Assessment Patient needs continued PT services  PT Diagnosis Difficulty  walking   PT Problem List Decreased strength;Decreased activity tolerance;Decreased balance;Decreased mobility;Decreased cognition  PT Treatment Interventions Gait training;Functional mobility training;Therapeutic activities;Patient/family education;Therapeutic exercise   PT Goals (Current goals can be found in the Care Plan section) Acute Rehab PT Goals Patient Stated Goal: none stated PT Goal Formulation: Patient unable to participate in goal setting Time For Goal Achievement: 09/24/15 Potential to Achieve Goals: Good    Frequency Min 3X/week   Barriers to discharge        Co-evaluation               End of Session  Equipment Utilized During Treatment: Gait belt Activity Tolerance: Patient tolerated treatment well Patient left: in chair;with call bell/phone within reach;with chair alarm set           Time: [id number] PT Time Calculation (min) (ACUTE ONLY): 26 min   Charges:   PT Evaluation $PT Eval Low Complexity: 1 Procedure PT Treatments $Gait Training: 8-22 mins   PT G Codes:        [name], [name] 09/10/2015, 9:47 AM [phone]

## 2015-09-10 NOTE — Progress Notes (Addendum)
PROGRESS NOTE    [name] who currently lives at home alone with family members nearby.Daughter states that last Friday she was seen to be in her usual state health. They called her on Saturday and did not answer the phone for which they went over that evening the check on her found her on the floor in the living room down. She was able to ambulate and they took her back to their house. Over the last 48 hours she has had a functional decline becoming increasingly confused, disoriented, having generalized weakness, not functioning at her baseline. In the emergency department she was found to be in A. fib with RVR, that responded to IV diltiazem administered in the emergency department. A urinalysis showed the presence of urinary tract infection  Assessment & Plan:  1. Failure to thrive/functional decline.  history of Mild [name], s/p fall sometime Saturday with a subsequent functional decline noted by family members.  -Suspect secondary to urinary tract infection and dehydration. -improved and close to baseline but deconditioned   2. Atrial fibrillation with rapid ventricular response. - CHADSVasc score of 3. May have been precipitated by infection and dehydration.  - required Cardizem drip , now off - HR in 150s overnight will increase Cardizem dose and continue Metoprolol - continue  Xarelto  3. Enterococcus Urinary tract infection. -stopped IV ceftriaxone, changed to Po Amoxicillin, continue for 3more days  -Urine cultures with Enterococcus, pansensitive  4. Hypertension.  -see above  5. Mild [name] -stable  6. Hypokalemia -replaced  DVT prophylaxis: on xarelto Code Status:DNR per dtr Family Communication: called and d/w Daughter today Disposition Plan: SNF tomorrow if HR remains  stable   Antimicrobials: rocephin   Subjective: No events, HR high last pm, some confusion  Objective: Filed Vitals:   09/09/15 0953 09/09/15 1417 09/09/15 2155 09/10/15 0503  BP: 134/68 139/76 157/67 134/67  Pulse: 84 79 78 77  Temp:  98.7 F (37.1 C) 97.9 F (36.6 C) 97.7 F (36.5 C)  TempSrc:  Oral Oral Oral  Resp:  18 16 14   Height:      Weight:      SpO2:  97% 96% 96%    Intake/Output Summary (Last 24 hours) at 09/10/15 1233 Last data filed at 09/10/15 0845  Gross per 24 hour  Intake    120 ml  Output      0 ml  Net    120 ml   Filed Weights   09/06/15 1715  Weight: 50 kg (110 lb 3.7 oz)    Examination:  General exam: Appears calm and comfortable , no distress, oriented to self and place only Respiratory system: Clear to auscultation. Respiratory effort normal. Cardiovascular system: S1 & S2 heard, RRR. No JVD, murmurs, rubs, gallops or clicks. No pedal edema. Gastrointestinal system: Abdomen is nondistended, soft and nontender. No organomegaly or masses felt. Normal bowel sounds  Central nervous system: Alert and oriented to place, person. No focal neurological deficits. Extremities: Symmetric 5 x 5 power. Skin: No rashes, lesions or ulcers Psychiatry: appropriate for the most part    Data Reviewed: I have personally reviewed following labs and imaging studies  CBC:  Recent Labs Lab 09/06/15 1211 09/07/15 0424 09/08/15 0443 09/09/15 0445 09/10/15 0834  WBC 7.4 6.9 9.9 7.2 7.6  HGB 13.5 12.7 13.5 12.2 13.6  HCT 39.2  37.5 39.1 34.8* 38.8  MCV 96.1 96.2 94.9 92.8 93.3  PLT 263 261 304 249 313   Basic Metabolic Panel:  Recent Labs Lab 09/06/15 1211 09/07/15 0424 09/08/15 0443 09/09/15 0445 09/10/15 0834  NA 136 137 138 135 135  K 4.0 3.4* 3.4* 2.7* 4.1  CL 103 105 108 105 105  CO2 24 23 21* 23 23  GLUCOSE 147* 99 168* 115* 137*  BUN 16 11 8 8 8   CREATININE 1.11* 0.90 0.78 0.87 0.93  CALCIUM 8.8* 8.2* 8.9 8.7* 9.1   GFR: Estimated  Creatinine Clearance: 31.7 mL/min (by C-G formula based on Cr of 0.93). Liver Function Tests: No results for input(s): AST, ALT, ALKPHOS, BILITOT, PROT, ALBUMIN in the last 168 hours. No results for input(s): LIPASE, AMYLASE in the last 168 hours. No results for input(s): AMMONIA in the last 168 hours. Coagulation Profile:  Recent Labs Lab 09/06/15 1210  INR 1.34   Cardiac Enzymes:  Recent Labs Lab 09/06/15 1211  CKTOTAL 117   BNP (last 3 results) No results for input(s): PROBNP in the last 8760 hours. HbA1C: No results for input(s): HGBA1C in the last 72 hours. CBG:  Recent Labs Lab 09/06/15 1203  GLUCAP 157*   Lipid Profile: No results for input(s): CHOL, HDL, LDLCALC, TRIG, CHOLHDL, LDLDIRECT in the last 72 hours. Thyroid Function Tests: No results for input(s): TSH, T4TOTAL, FREET4, T3FREE, THYROIDAB in the last 72 hours. Anemia Panel: No results for input(s): VITAMINB12, FOLATE, FERRITIN, TIBC, IRON, RETICCTPCT in the last 72 hours. Urine analysis:    Component Value Date/Time   COLORURINE YELLOW 09/06/2015 1410   APPEARANCEUR CLOUDY* 09/06/2015 1410   LABSPEC 1.021 09/06/2015 1410   PHURINE 6.5 09/06/2015 1410   GLUCOSEU NEGATIVE 09/06/2015 1410   HGBUR NEGATIVE 09/06/2015 1410   BILIRUBINUR SMALL* 09/06/2015 1410   KETONESUR 15* 09/06/2015 1410   PROTEINUR NEGATIVE 09/06/2015 1410   UROBILINOGEN 1.0 10/13/2014 0332   NITRITE NEGATIVE 09/06/2015 1410   LEUKOCYTESUR MODERATE* 09/06/2015 1410   Sepsis Labs: @LABRCNTIP (procalcitonin:4,lacticidven:4)  ) Recent Results (from the past 240 hour(s))  Urine culture     Status: Abnormal   Collection Time: 09/06/15  2:10 PM  Result Value Ref Range Status   Specimen Description URINE, CLEAN CATCH  Final   Special Requests NONE  Final   Culture >=100,000 COLONIES/mL ENTEROCOCCUS SPECIES (A)  Final   Report Status 09/09/2015 FINAL  Final   Organism ID, Bacteria ENTEROCOCCUS SPECIES (A)  Final       Susceptibility   Enterococcus species - MIC*    AMPICILLIN <=2 SENSITIVE Sensitive     LEVOFLOXACIN 0.5 SENSITIVE Sensitive     NITROFURANTOIN <=16 SENSITIVE Sensitive     VANCOMYCIN 1 SENSITIVE Sensitive     * >=100,000 COLONIES/mL ENTEROCOCCUS SPECIES  Urine culture     Status: Abnormal   Collection Time: 09/07/15  1:40 AM  Result Value Ref Range Status   Specimen Description URINE, CLEAN CATCH  Final   Special Requests NONE  Final   Culture 80,000 COLONIES/mL ENTEROCOCCUS SPECIES (A)  Final   Report Status 09/09/2015 FINAL  Final   Organism ID, Bacteria ENTEROCOCCUS SPECIES (A)  Final      Susceptibility   Enterococcus species - MIC*    AMPICILLIN <=2 SENSITIVE Sensitive     LEVOFLOXACIN 1 SENSITIVE Sensitive     NITROFURANTOIN <=16 SENSITIVE Sensitive     VANCOMYCIN 2 SENSITIVE Sensitive     * 80,000 COLONIES/mL ENTEROCOCCUS SPECIES  C difficile quick  scan w PCR reflex     Status: None   Collection Time: 09/09/15  2:05 PM  Result Value Ref Range Status   C Diff antigen NEGATIVE NEGATIVE Final   C Diff toxin NEGATIVE NEGATIVE Final   C Diff interpretation Negative for toxigenic C. difficile  Final         Radiology Studies: No results found.      Scheduled Meds: . amoxicillin  500 mg Oral Q8H  . diltiazem  120 mg Oral Daily  . [START ON 09/11/2015] diltiazem  300 mg Oral Daily  . metoprolol tartrate  50 mg Oral BID  . pantoprazole  40 mg Oral Daily  . Rivaroxaban  15 mg Oral Q supper  . sodium chloride flush  3 mL Intravenous Q12H   Continuous Infusions:     LOS: 4 days    Time spent:    Zannie Cove, MD [facility] Pager [phone]  If 7PM-7AM, please contact night-coverage [email or web address] Password [facility] 09/10/2015, 12:33 PM

## 2015-09-11 DIAGNOSIS — I482 Chronic atrial fibrillation, unspecified: Secondary | ICD-10-CM | POA: Insufficient documentation

## 2015-09-11 LAB — CBC
HEMATOCRIT: 38.9 % (ref 36.0–46.0)
Hemoglobin: 13.4 g/dL (ref 12.0–15.0)
MCH: 32.9 pg (ref 26.0–34.0)
MCHC: 34.4 g/dL (ref 30.0–36.0)
MCV: 95.6 fL (ref 78.0–100.0)
PLATELETS: 304 10*3/uL (ref 150–400)
RBC: 4.07 MIL/uL (ref 3.87–5.11)
RDW: 13.2 % (ref 11.5–15.5)
WBC: 8.9 10*3/uL (ref 4.0–10.5)

## 2015-09-11 LAB — BASIC METABOLIC PANEL
ANION GAP: 6 (ref 5–15)
BUN: 7 mg/dL (ref 6–20)
CO2: 26 mmol/L (ref 22–32)
Calcium: 8.8 mg/dL — ABNORMAL LOW (ref 8.9–10.3)
Chloride: 103 mmol/L (ref 101–111)
Creatinine, Ser: 0.87 mg/dL (ref 0.44–1.00)
GFR calc Af Amer: >60 mL/min (ref >60)
GFR, EST NON AFRICAN AMERICAN: 57 mL/min — AB (ref >60)
GLUCOSE: 110 mg/dL — AB (ref 65–99)
POTASSIUM: 4 mmol/L (ref 3.5–5.1)
Sodium: 135 mmol/L (ref 135–145)

## 2015-09-11 MED ORDER — AMOXICILLIN 500 MG PO CAPS: 500.0000 mg | ORAL_CAPSULE | Freq: Three times a day (TID) | ORAL | Status: DC

## 2015-09-11 MED ORDER — METOPROLOL TARTRATE 50 MG PO TABS: 50.0000 mg | ORAL_TABLET | Freq: Two times a day (BID) | ORAL | Status: AC

## 2015-09-11 MED ORDER — DILTIAZEM HCL ER COATED BEADS 240 MG PO CP24: 240.0000 mg | ORAL_CAPSULE | Freq: Every day | ORAL | Status: DC

## 2015-09-11 NOTE — Clinical Social Work Placement (Signed)
   CLINICAL SOCIAL WORK PLACEMENT  NOTE  Date:  09/11/2015  Patient Details  Name: [name] MRN: [id number] Date of Birth: [birth year]  Clinical Social Work is seeking post-discharge placement for this patient at the Skilled  Nursing Facility level of care (*CSW will initial, date and re-position this form in  chart as items are completed):  Yes   Patient/family provided with [facility] Clinical Social Work Department's list of facilities offering this level of care within the geographic area requested by the patient (or if unable, by the patient's family).  Yes   Patient/family informed of their freedom to choose among providers that offer the needed level of care, that participate in Medicare, Medicaid or managed care program needed by the patient, have an available bed and are willing to accept the patient.  Yes   Patient/family informed of [facility]'s ownership interest in [facility] and [facility], as well as of the fact that they are under no obligation to receive care at these facilities.  PASRR submitted to EDS on 09/08/15     PASRR number received on 09/08/15     Existing PASRR number confirmed on       FL2 transmitted to all facilities in geographic area requested by pt/family on 09/08/15     FL2 transmitted to all facilities within larger geographic area on       Patient informed that his/her managed care company has contracts with or will negotiate with certain facilities, including the following:      Yes      Patient/family informed of bed offers received.  09/11/2015   [facility]  Physician recommends and patient chooses bed at     SNF Patient to be transferred to   on  .  09/11/2015   Patient to be transferred to facility by       EMS  Patient family notified on   of transfer.  daughter  Name of family member notified:      [facility]IAN       Additional Comment:     _______________________________________________ Raye Sorrowoble, Cadi Rhinehart N, LCSW 09/11/2015, 9:41 AM

## 2015-09-11 NOTE — Progress Notes (Signed)
LCSW made aware that patient is medically stable for discharge today to SNF. [facility] and call placed to [place] with admissions. Agreeable to accept patient and all clinicals sent to facility via epic. LCSW spoke with patient daughter in room at hospital and she is agreeable to SNF.  Patient will transport by EMS.  No other needs at this time. DC today: SNF.  Deretha EmoryHannah Gad Aymond LCSW, MSW Clinical Social Work: [facility] [phone]

## 2015-09-11 NOTE — Discharge Summary (Signed)
Physician Discharge Summary  [name]:[id number] DOB: 1922-05-30 DOA: 09/06/2015  PCP: Astrid Divine, MD  Admit date: 09/06/2015 Discharge date: 09/11/2015  Time spent: 45 minutes  Recommendations for Outpatient Follow-up:  1. PCP Dr.Griffin in 1 week   Discharge Diagnoses:  Principal Problem:   FTT (failure to thrive) in adult   Afib with RVR   Enterococcus UTI   Mild Dementia   HTN (hypertension)   Atrial fibrillation (HCC)   Long term (current) use of anticoagulants   UTI (lower urinary tract infection)   Acute encephalopathy   Chronic atrial fibrillation (HCC)   Discharge Condition: stable  Diet recommendation: heart healthy  Filed Weights   09/06/15 1715  Weight: 50 kg (110 lb 3.7 oz)    History of present illness:  [name] is a 82 y.o. female with medical history significant of cognitive impairment who currently resides in the community, living at home alone with family members nearby. Daughter stated that last Friday she was seen to be in her usual state health. They called her on Saturday and did not answer the phone for which they went over that evening the check on her found her on the floor in the living room down. She was able to ambulate and they took her back to their house. Over the last 48 hours she has had a functional decline becoming increasingly confused, disoriented, having generalized weakness, not functioning at her baseline  Hospital Course:  1. Failure to thrive/functional decline. history of Mild dementia, s/p fall sometime Saturday with a subsequent functional decline noted by family members.  -Suspect secondary to urinary tract infection and dehydration. -improved and close to baseline but deconditioned, will be going to SNF for rehab  2. Atrial fibrillation with rapid ventricular response. - CHADSVasc score of 3. May have been precipitated by infection and dehydration.  - required Cardizem drip initially , now  off - have increased Cardizem and Metoprolol dose due to poorly controlled Afib - continue Xarelto  3. Enterococcus Urinary tract infection. -was initially on IV ceftriaxone, then changed to Po Amoxicillin, continue for days  -Urine cultures with pan sensitive Enterococcus  4. Hypertension.  -see above  5. Mild Dementia -stable  6. Hypokalemia -replaced  DNR  Discharge Exam: Filed Vitals:   09/10/15 2132 09/11/15 0557  BP: 142/76 138/87  Pulse: 73 80  Temp: 97.9 F (36.6 C) 97.8 F (36.6 C)  Resp: 15 16    General: AAOx3 Cardiovascular: S1S2/RRR Respiratory: CTAB  Discharge Instructions   Discharge Instructions    Diet - low sodium heart healthy    Complete by:  As directed      Increase activity slowly    Complete by:  As directed           Current Discharge Medication List    START taking these medications   Details  amoxicillin (AMOXIL) 500 MG capsule Take 1 capsule (500 mg total) by mouth every 8 (eight) hours. For 3 days      CONTINUE these medications which have CHANGED   Details  diltiazem (CARDIZEM CD) 240 MG 24 hr capsule Take 1 capsule (240 mg total) by mouth daily.    metoprolol tartrate (LOPRESSOR) 50 MG tablet Take 1 tablet (50 mg total) by mouth 2 (two) times daily.      CONTINUE these medications which have NOT CHANGED   Details  ferrous sulfate 325 (65 FE) MG tablet Take 325 mg by mouth daily with breakfast.  omeprazole (PRILOSEC) 20 MG capsule Take 20 mg by mouth 2 (two) times daily before a meal.     Rivaroxaban (XARELTO) 15 MG TABS tablet Take 1 tablet (15 mg total) by mouth daily with supper. Qty: 30 tablet, Refills: 0       Allergies  Allergen Reactions  . Codeine Nausea And Vomiting   Follow-up Information    Follow up with [facility].   Specialty:  Skilled Nursing Facility   Contact information:   [address] [place] [address] [phone]      Follow up with Astrid Divine, MD. Schedule an appointment as soon as possible for a visit in 1 week.   Specialty:  Family Medicine   Contact information:   [address] [place] [address] [phone]        The results of significant diagnostics from this hospitalization (including imaging, microbiology, ancillary and laboratory) are listed below for reference.    Significant Diagnostic Studies: Dg Chest 2 View  09/06/2015  CLINICAL DATA:  Fall. EXAM: CHEST  2 VIEW COMPARISON:  October 11, 2014. FINDINGS: Stable cardiomediastinal silhouette. No pneumothorax or pleural effusion is noted. No acute pulmonary disease is noted. Bony thorax is unremarkable. IMPRESSION: No active cardiopulmonary disease. Electronically Signed   By: Lupita Raider, M.D.   On: 09/06/2015 13:06   Ct Head Wo Contrast  09/06/2015  CLINICAL DATA:  Recent fall EXAM: CT HEAD WITHOUT CONTRAST TECHNIQUE: Contiguous axial images were obtained from the base of the skull through the vertex without intravenous contrast. COMPARISON:  01/28/2007 FINDINGS: The bony calvarium is intact. Diffuse atrophic changes are noted. No findings to suggest acute hemorrhage, acute infarction or space-occupying mass lesion is noted. IMPRESSION: Chronic atrophic changes without acute abnormality. Electronically Signed   By: Alcide Clever M.D.   On: 09/06/2015 12:59    Microbiology: Recent Results (from the past 240 hour(s))  Urine culture     Status: Abnormal   Collection Time: 09/06/15  2:10 PM  Result Value Ref Range Status   Specimen Description URINE, CLEAN CATCH  Final   Special Requests NONE  Final   Culture >=100,000 COLONIES/mL ENTEROCOCCUS SPECIES (A)  Final   Report Status 09/09/2015 FINAL  Final   Organism ID, Bacteria ENTEROCOCCUS SPECIES (A)  Final      Susceptibility   Enterococcus species - MIC*    AMPICILLIN <=2 SENSITIVE Sensitive     LEVOFLOXACIN 0.5 SENSITIVE Sensitive     NITROFURANTOIN <=16 SENSITIVE Sensitive      VANCOMYCIN 1 SENSITIVE Sensitive     * >=100,000 COLONIES/mL ENTEROCOCCUS SPECIES  Urine culture     Status: Abnormal   Collection Time: 09/07/15  1:40 AM  Result Value Ref Range Status   Specimen Description URINE, CLEAN CATCH  Final   Special Requests NONE  Final   Culture 80,000 COLONIES/mL ENTEROCOCCUS SPECIES (A)  Final   Report Status 09/09/2015 FINAL  Final   Organism ID, Bacteria ENTEROCOCCUS SPECIES (A)  Final      Susceptibility   Enterococcus species - MIC*    AMPICILLIN <=2 SENSITIVE Sensitive     LEVOFLOXACIN 1 SENSITIVE Sensitive     NITROFURANTOIN <=16 SENSITIVE Sensitive     VANCOMYCIN 2 SENSITIVE Sensitive     * 80,000 COLONIES/mL ENTEROCOCCUS SPECIES  C difficile quick scan w PCR reflex     Status: None   Collection Time: 09/09/15  2:05 PM  Result Value Ref Range Status  C Diff antigen NEGATIVE NEGATIVE Final   C Diff toxin NEGATIVE NEGATIVE Final   C Diff interpretation Negative for toxigenic C. difficile  Final     Labs: Basic Metabolic Panel:  Recent Labs Lab 09/07/15 0424 09/08/15 0443 09/09/15 0445 09/10/15 0834 09/11/15 0505  NA 137 138 135 135 135  K 3.4* 3.4* 2.7* 4.1 4.0  CL 105 108 105 105 103  CO2 23 21* 23 23 26   GLUCOSE 99 168* 115* 137* 110*  BUN 11 8 8 8 7   CREATININE 0.90 0.78 0.87 0.93 0.87  CALCIUM 8.2* 8.9 8.7* 9.1 8.8*   Liver Function Tests: No results for input(s): AST, ALT, ALKPHOS, BILITOT, PROT, ALBUMIN in the last 168 hours. No results for input(s): LIPASE, AMYLASE in the last 168 hours. No results for input(s): AMMONIA in the last 168 hours. CBC:  Recent Labs Lab 09/07/15 0424 09/08/15 0443 09/09/15 0445 09/10/15 0834 09/11/15 0505  WBC 6.9 9.9 7.2 7.6 8.9  HGB 12.7 13.5 12.2 13.6 13.4  HCT 37.5 39.1 34.8* 38.8 38.9  MCV 96.2 94.9 92.8 93.3 95.6  PLT 261 304 249 313 304   Cardiac Enzymes:  Recent Labs Lab 09/06/15 1211  CKTOTAL 117   BNP: BNP (last 3 results) No results for input(s): BNP in the last  8760 hours.  ProBNP (last 3 results) No results for input(s): PROBNP in the last 8760 hours.  CBG:  Recent Labs Lab 09/06/15 1203  GLUCAP 157*       SignedZannie Cove:  Ross Bender MD.  [facility] 09/11/2015, 9:29 AM

## 2015-09-11 NOTE — Discharge Summary (Signed)
[facility].Pt Alert /confused. No skin issues. VS stable.  [facility].

## 2015-09-12 ENCOUNTER — Encounter: Payer: Self-pay | Admitting: Adult Health

## 2015-09-12 ENCOUNTER — Non-Acute Institutional Stay (SKILLED_NURSING_FACILITY): Payer: Medicare Other | Admitting: Adult Health

## 2015-09-12 DIAGNOSIS — K2100 Gastro-esophageal reflux disease with esophagitis, without bleeding: Secondary | ICD-10-CM

## 2015-09-12 DIAGNOSIS — I10 Essential (primary) hypertension: Secondary | ICD-10-CM

## 2015-09-12 DIAGNOSIS — B370 Candidal stomatitis: Secondary | ICD-10-CM

## 2015-09-12 DIAGNOSIS — F0390 Unspecified dementia without behavioral disturbance: Secondary | ICD-10-CM | POA: Diagnosis not present

## 2015-09-12 DIAGNOSIS — R627 Adult failure to thrive: Secondary | ICD-10-CM | POA: Diagnosis not present

## 2015-09-12 DIAGNOSIS — R5381 Other malaise: Secondary | ICD-10-CM | POA: Diagnosis not present

## 2015-09-12 DIAGNOSIS — D509 Iron deficiency anemia, unspecified: Secondary | ICD-10-CM | POA: Diagnosis not present

## 2015-09-12 DIAGNOSIS — E43 Unspecified severe protein-calorie malnutrition: Secondary | ICD-10-CM | POA: Diagnosis not present

## 2015-09-12 DIAGNOSIS — I482 Chronic atrial fibrillation, unspecified: Secondary | ICD-10-CM

## 2015-09-12 DIAGNOSIS — N390 Urinary tract infection, site not specified: Secondary | ICD-10-CM | POA: Diagnosis not present

## 2015-09-12 NOTE — Progress Notes (Signed)
Patient ID: Connie NasutiSusie K Hopkin, female   DOB: 1922/06/23, 82 y.o.   MRN: [id number]    DATE:    09/12/15  MRN:  [id number]  BIRTHDAY: 1922/06/23  Facility:  Nursing Home Location:  [facility]  Nursing Home Room Number: 207-P  LEVEL OF CARE:  SNF [phone])  Contact Information    Name Relation Home Work Mobile   PekinSykes,Beverly Daughter [phone]   [name],Al Other [phone]  [phone]   Karrie DoffingSkyes,Hunter Grandson   [phone]       Code Status History    Date Active Date Inactive Code Status Order ID Comments User Context   09/10/2015 12:39 PM 09/11/2015  4:54 PM DNR [id number]  Zannie CovePreetha Joseph, MD Inpatient   09/06/2015  5:13 PM 09/10/2015 12:39 PM Full Code [id number]  Jeralyn BennettEzequiel Zamora, MD Inpatient   10/11/2014 11:32 PM 10/14/2014  2:34 PM Full Code [id number]  Ozella Rocksavid J Merrell, MD Inpatient   08/18/2012  6:48 PM 08/25/2012  4:14 PM Full Code [id number]  Laveda Normanhris N Oti, MD Inpatient    Questions for Most Recent Historical Code Status (Order [id number])    Question Answer Comment   In the event of cardiac or respiratory ARREST Do not call a "code blue"    In the event of cardiac or respiratory ARREST Do not perform Intubation, CPR, defibrillation or ACLS    In the event of cardiac or respiratory ARREST Use medication by any route, position, wound care, and other measures to relive pain and suffering. May use oxygen, suction and manual treatment of airway obstruction as needed for comfort.     Advance Directive Documentation        Most Recent Value   Type of Advance Directive  Out of facility DNR (pink MOST or yellow form)   Pre-existing out of facility DNR order (yellow form or pink MOST form)     "MOST" Form in Place?         Chief Complaint  Patient presents with  . Hospitalization Follow-up    HISTORY OF PRESENT ILLNESS:   This is a 82 year old female who has [facility]. She has PMH of  cognitive impairment. She was living home alone with family members nearby. She was found by family members on the floor in the living room down. She was able to ambulate but was having increased confusion and generalized weakness. She was treated or UTI with IV ceftriaxone then changed to po amoxicillin. She had atrial fibrillation with RVR and was treated with Cardizem drip initially. Her Cardizem and metoprolol dose were increased due to poorly controlled atrial fibrillation.  She was seen in her room today and noted to have oral thrush.  She has been admitted for a short-term rehabilitation.  PAST MEDICAL HISTORY:  Past Medical History  Diagnosis Date  . Arthritis   . GERD (gastroesophageal reflux disease)   . Hypertension   . Depression   . Hypercholesteremia   . Osteoporosis   . Anxiety   . Dyskinesia   . Dementia   . Esophageal stricture   . Dysrhythmia     hx. Paroxsymal A. Fib     CURRENT MEDICATIONS: Reviewed  Patient's Medications  New Prescriptions   No medications on file  Previous Medications   AMOXICILLIN (AMOXIL) 500 MG CAPSULE    Take 1 capsule (500 mg total) by mouth every 8 (eight) hours. For 3 days   DILTIAZEM (CARDIZEM CD) 240 MG  24 HR CAPSULE    Take 1 capsule (240 mg total) by mouth daily.   FERROUS SULFATE 325 (65 FE) MG TABLET    Take 325 mg by mouth daily with breakfast.   METOPROLOL TARTRATE (LOPRESSOR) 50 MG TABLET    Take 1 tablet (50 mg total) by mouth 2 (two) times daily.   OMEPRAZOLE (PRILOSEC) 20 MG CAPSULE    Take 20 mg by mouth 2 (two) times daily before a meal.    RIVAROXABAN (XARELTO) 15 MG TABS TABLET    Take 1 tablet (15 mg total) by mouth daily with supper.  Modified Medications   No medications on file  Discontinued Medications   No medications on file     Allergies  Allergen Reactions  . Codeine Nausea And Vomiting     REVIEW OF SYSTEMS:  GENERAL: no change in appetite, no fatigue, no weight changes, no fever, chills or  weakness EYES: Denies change in vision, dry eyes, eye pain, itching or discharge EARS: Denies change in hearing, ringing in ears, or earache NOSE: Denies nasal congestion or epistaxis MOUTH and THROAT: Denies oral discomfort, gingival pain or bleeding, pain from teeth or hoarseness   RESPIRATORY: no cough, SOB, DOE, wheezing, hemoptysis CARDIAC: no chest pain, edema or palpitations GI: no abdominal pain, diarrhea, constipation, heart burn, nausea or vomiting GU: Denies dysuria, frequency, hematuria, incontinence, or discharge PSYCHIATRIC: Denies feeling of depression or anxiety. No report of hallucinations, insomnia, paranoia, or agitation    PHYSICAL EXAMINATION  GENERAL APPEARANCE: Well nourished. In no acute distress. Normal body habitus SKIN:  Skin is warm and dry.  HEAD: Normal in size and contour. No evidence of trauma EYES: Lids open and close normally. No blepharitis, entropion or ectropion. PERRL. Conjunctivae are clear and sclerae are white. Lenses are without opacity EARS: Pinnae are normal. Patient hears normal voice tunes of the examiner MOUTH and THROAT: Lips are without lesions. Oral mucosa is moist and without lesions. Tongue has brownish cheesy coating NECK: supple, trachea midline, no neck masses, no thyroid tenderness, no thyromegaly LYMPHATICS: no LAN in the neck, no supraclavicular LAN RESPIRATORY: breathing is even & unlabored, BS CTAB CARDIAC: RRR, no murmur,no extra heart sounds, no edema GI: abdomen soft, normal BS, no masses, no tenderness, no hepatomegaly, no splenomegaly EXTREMITIES:  Able to move 4 extremities PSYCHIATRIC: Alert to person and place but disoriented to time. Affect and behavior are appropriate  LABS/RADIOLOGY: Labs reviewed: Basic Metabolic Panel:  Recent Labs  11/91/47 0445 09/10/15 0834 09/11/15 0505  NA 135 135 135  K 2.7* 4.1 4.0  CL 105 105 103  CO2 23 23 26   GLUCOSE 115* 137* 110*  BUN 8 8 7   CREATININE 0.87 0.93 0.87   CALCIUM 8.7* 9.1 8.8*   CBC:  Recent Labs  10/11/14 1908  09/09/15 0445 09/10/15 0834 09/11/15 0505  WBC 6.5  < > 7.2 7.6 8.9  NEUTROABS 4.6  --   --   --   --   HGB 13.2  < > 12.2 13.6 13.4  HCT 39.0  < > 34.8* 38.8 38.9  MCV 97.0  < > 92.8 93.3 95.6  PLT 254  < > 249 313 304  < > = values in this interval not displayed.  Cardiac Enzymes:  Recent Labs  09/06/15 1211  CKTOTAL 117   CBG:  Recent Labs  09/06/15 1203  GLUCAP 157*    Dg Chest 2 View  09/06/2015  CLINICAL DATA:  Fall. EXAM: CHEST  2  VIEW COMPARISON:  October 11, 2014. FINDINGS: Stable cardiomediastinal silhouette. No pneumothorax or pleural effusion is noted. No acute pulmonary disease is noted. Bony thorax is unremarkable. IMPRESSION: No active cardiopulmonary disease. Electronically Signed   By: Lupita Raider, M.D.   On: 09/06/2015 13:06   Ct Head Wo Contrast  09/06/2015  CLINICAL DATA:  Recent fall EXAM: CT HEAD WITHOUT CONTRAST TECHNIQUE: Contiguous axial images were obtained from the base of the skull through the vertex without intravenous contrast. COMPARISON:  01/28/2007 FINDINGS: The bony calvarium is intact. Diffuse atrophic changes are noted. No findings to suggest acute hemorrhage, acute infarction or space-occupying mass lesion is noted. IMPRESSION: Chronic atrophic changes without acute abnormality. Electronically Signed   By: Alcide Clever M.D.   On: 09/06/2015 12:59    ASSESSMENT/PLAN:  Physical deconditioning - for rehabilitation  Failure to thrive - suspected to be secondary to urinary tract infection and dehydration - continue supplementation and supportive care  Atrial fibrillation with RVR - rate controlled; continue Xarelto, Cardizem and metoprolol  UTI - continue amoxicillin 3 days  Hypertension - continue Cardizem and metoprolol; check BMP  Dementia - stable  Anemia, iron deficiency - hemoglobin 13.4; discontinue ferrous sulfate; check CBC  GERD - continue omeprazole  Oral  yeast - start nystatin 100,000 units/g give 5 ML swish and spit 4 times a day 2 weeks  Protein calorie malnutrition, severe -  vitamin D 2.8; start Procel 2 scoops by mouth twice a day; RD consult    Goals of care:  Short-term rehabilitation    Kenard Gower, NP [facility] [phone]

## 2015-09-13 ENCOUNTER — Encounter: Payer: Self-pay | Admitting: Internal Medicine

## 2015-09-13 ENCOUNTER — Non-Acute Institutional Stay (SKILLED_NURSING_FACILITY): Payer: Medicare Other | Admitting: Internal Medicine

## 2015-09-13 DIAGNOSIS — B370 Candidal stomatitis: Secondary | ICD-10-CM | POA: Diagnosis not present

## 2015-09-13 DIAGNOSIS — N390 Urinary tract infection, site not specified: Secondary | ICD-10-CM

## 2015-09-13 DIAGNOSIS — E46 Unspecified protein-calorie malnutrition: Secondary | ICD-10-CM

## 2015-09-13 DIAGNOSIS — I10 Essential (primary) hypertension: Secondary | ICD-10-CM | POA: Diagnosis not present

## 2015-09-13 DIAGNOSIS — K219 Gastro-esophageal reflux disease without esophagitis: Secondary | ICD-10-CM

## 2015-09-13 DIAGNOSIS — I482 Chronic atrial fibrillation, unspecified: Secondary | ICD-10-CM

## 2015-09-13 DIAGNOSIS — D509 Iron deficiency anemia, unspecified: Secondary | ICD-10-CM | POA: Diagnosis not present

## 2015-09-13 DIAGNOSIS — R1310 Dysphagia, unspecified: Secondary | ICD-10-CM | POA: Diagnosis not present

## 2015-09-13 DIAGNOSIS — R531 Weakness: Secondary | ICD-10-CM | POA: Diagnosis not present

## 2015-09-13 DIAGNOSIS — F0390 Unspecified dementia without behavioral disturbance: Secondary | ICD-10-CM | POA: Diagnosis not present

## 2015-09-13 LAB — BASIC METABOLIC PANEL
BUN: 13 mg/dL (ref 4–21)
Creatinine: 1 mg/dL (ref 0.5–1.1)
GLUCOSE: 125 mg/dL
POTASSIUM: 3.8 mmol/L (ref 3.4–5.3)
SODIUM: 136 mmol/L — AB (ref 137–147)

## 2015-09-13 LAB — CBC AND DIFFERENTIAL
HEMATOCRIT: 40 % (ref 36–46)
Hemoglobin: 13.7 g/dL (ref 12.0–16.0)
NEUTROS ABS: 6 /uL
Platelets: 366 10*3/uL (ref 150–399)
WBC: 8.1 10*3/mL

## 2015-09-13 NOTE — Progress Notes (Signed)
LOCATION: [facility]  PCP: Astrid Divine, MD   Code Status: DNR  Goals of care: Advanced Directive information Advanced Directives 09/12/2015  Does patient have an advance directive? Yes  Type of Advance Directive Out of facility DNR (pink MOST or yellow form)  Does patient want to make changes to advanced directive? No - Patient declined  Copy of advanced directive(s) in chart? Yes       Extended Emergency Contact Information Primary Emergency Contact: Sykes,Beverly Address: [address]          [place], [place] [address] [place] Home Phone: [phone] Work Phone: [phone] Mobile Phone: [phone] Relation: Daughter Secondary Emergency Contact: Salley Scarlet Address: [address]          [place], [place] [address] [place] Home Phone: [phone] Mobile Phone: [phone] Relation: Other   Allergies  Allergen Reactions  . Codeine Nausea And Vomiting    Chief Complaint  Patient presents with  . Readmit To SNF    Readmission     HPI:  Patient is a 82 y.o. female seen today for short term rehabilitation post hospital admission from 09/06/15-09/11/15 with altered mental state and generalized weakness. She was found to enterococcus UTI and afib with RVR. She was started on antibiotics. She was started on cardizem gtt and later switched to oral medications. She is seen in her room today. She is pleasantly confused and has history of dementia.    Review of Systems:  Constitutional: Negative for fever, chills, diaphoresis. Feels weak and tired.  HENT: Negative for headache, congestion, nasal discharge. Has difficulty swallowing.   Eyes: Negative for blurred vision, double vision and discharge. Wears glasses.  Respiratory: Negative for shortness of breath and wheezing. Positive for cough. Cardiovascular: Negative for chest pain, palpitations, leg swelling.  Gastrointestinal: Negative for heartburn, nausea, vomiting,  abdominal pain. Had bowel movement yesterday.  Genitourinary: Negative for dysuria and flank pain.  Musculoskeletal: Negative for back pain, fall in the facility.  Skin: Negative for itching, rash.  Neurological: Negative for dizziness. Psychiatric/Behavioral: Negative for depression.   Past Medical History  Diagnosis Date  . Arthritis   . GERD (gastroesophageal reflux disease)   . Hypertension   . Depression   . Hypercholesteremia   . Osteoporosis   . Anxiety   . Dyskinesia   . Dementia   . Esophageal stricture   . Dysrhythmia     hx. Paroxsymal A. Fib   Past Surgical History  Procedure Laterality Date  . Tonsillectomy    . Esophagogastroduodenoscopy      with dil, multiple times  . Esophagogastroduodenoscopy  06/26/2011    Procedure: ESOPHAGOGASTRODUODENOSCOPY (EGD);  Surgeon: [name], MD;  Location: [facility];  Service: Endoscopy;  Laterality: N/A;  with c-arm  . Savory dilation  06/26/2011    Procedure: SAVORY DILATION;  Surgeon: [name], MD;  Location: [facility];  Service: Endoscopy;  Laterality: N/A;  . Esophagogastroduodenoscopy  10/22/2011    Procedure: ESOPHAGOGASTRODUODENOSCOPY (EGD);  Surgeon: Hilarie Fredrickson, MD;  Location: [facility];  Service: Endoscopy;  Laterality: N/A;  . Savory dilation  10/22/2011    Procedure: SAVORY DILATION;  Surgeon: Hilarie Fredrickson, MD;  Location: [facility];  Service: Endoscopy;  Laterality: N/A;  . Esophagogastroduodenoscopy  03/04/2012    Procedure: ESOPHAGOGASTRODUODENOSCOPY (EGD);  Surgeon: Hilarie Fredrickson, MD;  Location: [facility];  Service: Endoscopy;  Laterality: N/A;  . Savory dilation  03/04/2012    Procedure: SAVORY DILATION;  Surgeon:  Hilarie Fredrickson, MD;  Location: [facility];  Service: Endoscopy;  Laterality: N/A;  . Esophagogastroduodenoscopy N/A 07/15/2012    Procedure: ESOPHAGOGASTRODUODENOSCOPY (EGD);  Surgeon: Hilarie Fredrickson, MD;  Location: [facility];  Service: Endoscopy;  Laterality: N/A;  . Savory dilation N/A  07/15/2012    Procedure: SAVORY DILATION;  Surgeon: Hilarie Fredrickson, MD;  Location: [facility];  Service: Endoscopy;  Laterality: N/A;  . Orif ankle fracture Right 08/21/2012    Procedure: OPEN REDUCTION INTERNAL FIXATION (ORIF) ANKLE FRACTURE;  Surgeon: Nilda Simmer, MD;  Location: [facility];  Service: Orthopedics;  Laterality: Right;  . Cataract extraction, bilateral Bilateral   . Esophagogastroduodenoscopy N/A 05/18/2013    Procedure: ESOPHAGOGASTRODUODENOSCOPY (EGD);  Surgeon: Hilarie Fredrickson, MD;  Location: [facility];  Service: Endoscopy;  Laterality: N/A;  . Savory dilation N/A 05/18/2013    Procedure: SAVORY DILATION;  Surgeon: Hilarie Fredrickson, MD;  Location: [facility];  Service: Endoscopy;  Laterality: N/A;  . Esophagogastroduodenoscopy N/A 10/12/2014    Procedure: ESOPHAGOGASTRODUODENOSCOPY (EGD);  Surgeon: Iva Boop, MD;  Location: [facility];  Service: Endoscopy;  Laterality: N/A;  . Esophagogastroduodenoscopy N/A 10/27/2014    Procedure: ESOPHAGOGASTRODUODENOSCOPY (EGD);  Surgeon: Hilarie Fredrickson, MD;  Location: [facility];  Service: Endoscopy;  Laterality: N/A;  . Balloon dilation N/A 10/27/2014    Procedure: BALLOON DILATION;  Surgeon: Hilarie Fredrickson, MD;  Location: [facility];  Service: Endoscopy;  Laterality: N/A;   Social History:   reports that she has never smoked. She has never used smokeless tobacco. She reports that she does not drink alcohol or use illicit drugs.  Family History  Problem Relation Age of Onset  . Family history unknown: Yes    Medications:   Medication List       This list is accurate as of: 09/13/15  3:58 PM.  Always use your most recent med list.               amoxicillin 500 MG capsule  Commonly known as:  AMOXIL  Take 1 capsule (500 mg total) by mouth every 8 (eight) hours. For 3 days     diltiazem 240 MG 24 hr capsule  Commonly known as:  CARDIZEM CD  Take 1 capsule (240 mg total) by mouth daily.     metoprolol 50 MG tablet  Commonly  known as:  LOPRESSOR  Take 1 tablet (50 mg total) by mouth 2 (two) times daily.     nystatin 100000 UNIT/ML suspension  Commonly known as:  MYCOSTATIN  Take 5 mLs by mouth 4 (four) times daily.     omeprazole 20 MG capsule  Commonly known as:  PRILOSEC  Take 20 mg by mouth 2 (two) times daily before a meal.     PROCEL Powd  Take 2 scoop by mouth 2 (two) times daily.     Rivaroxaban 15 MG Tabs tablet  Commonly known as:  XARELTO  Take 1 tablet (15 mg total) by mouth daily with supper.        Immunizations:  There is no immunization history on file for this patient.   Physical Exam: Filed Vitals:   09/13/15 1553  BP: 162/82  Pulse: 77  Temp: 97.7 F (36.5 C)  TempSrc: Oral  Resp: 16  Height:  (1.6 m)  Weight: 110 lb (49.896 kg)  SpO2: 97%   Body mass index is 19.49 kg/(m^2).  General- elderly female, thinl built, in no acute distress Head- normocephalic, atraumatic Nose- no maxillary  or frontal sinus tenderness, no nasal discharge Throat- moist mucus membrane, has dentures Eyes- PERRLA, EOMI, no pallor, no icterus, no discharge, normal conjunctiva, normal sclera Neck- no cervical lymphadenopathy Cardiovascular- normal s1,s2, no murmur, trace leg edema Respiratory- bilateral clear to auscultation, no wheeze, no rhonchi, no crackles, no use of accessory muscles Abdomen- bowel sounds present, soft, non tender Musculoskeletal- able to move all 4 extremities, generalized weakness Neurological- alert and oriented to person, place and time but slow response to question Skin- warm and dry Psychiatry- normal mood and affect    Labs reviewed: Basic Metabolic Panel:  Recent Labs  16/01/9604/19/17 0445 09/10/15 0834 09/11/15 0505  NA 135 135 135  K 2.7* 4.1 4.0  CL 105 105 103  CO2 23 23 26   GLUCOSE 115* 137* 110*  BUN 8 8 7   CREATININE 0.87 0.93 0.87  CALCIUM 8.7* 9.1 8.8*   CBC:  Recent Labs  10/11/14 1908  09/09/15 0445 09/10/15 0834 09/11/15 0505    WBC 6.5  < > 7.2 7.6 8.9  NEUTROABS 4.6  --   --   --   --   HGB 13.2  < > 12.2 13.6 13.4  HCT 39.0  < > 34.8* 38.8 38.9  MCV 97.0  < > 92.8 93.3 95.6  PLT 254  < > 249 313 304  < > = values in this interval not displayed. Cardiac Enzymes:  Recent Labs  09/06/15 1211  CKTOTAL 117   BNP: Invalid input(s): POCBNP CBG:  Recent Labs  09/06/15 1203  GLUCAP 157*    Radiological Exams: Dg Chest 2 View  09/06/2015  CLINICAL DATA:  Fall. EXAM: CHEST  2 VIEW COMPARISON:  October 11, 2014. FINDINGS: Stable cardiomediastinal silhouette. No pneumothorax or pleural effusion is noted. No acute pulmonary disease is noted. Bony thorax is unremarkable. IMPRESSION: No active cardiopulmonary disease. Electronically Signed   By: Lupita RaiderJames  Green Jr, M.D.   On: 09/06/2015 13:06   Ct Head Wo Contrast  09/06/2015  CLINICAL DATA:  Recent fall EXAM: CT HEAD WITHOUT CONTRAST TECHNIQUE: Contiguous axial images were obtained from the base of the skull through the vertex without intravenous contrast. COMPARISON:  01/28/2007 FINDINGS: The bony calvarium is intact. Diffuse atrophic changes are noted. No findings to suggest acute hemorrhage, acute infarction or space-occupying mass lesion is noted. IMPRESSION: Chronic atrophic changes without acute abnormality. Electronically Signed   By: Alcide CleverMark  Lukens M.D.   On: 09/06/2015 12:59    Assessment/Plan  Generalized weakness Will have her work with physical therapy and occupational therapy team to help with gait training and muscle strengthening exercises.fall precautions. Skin care. Encourage to be out of bed.   Atrial fibrillation with RVR  rate controlled with cardizem and mmetoprolol. Continue xarelto for anticoagulation  Urinary tract infection Continue course of amoxicillin on 09/14/15. Maintain hydration  Oral thrush Continue nystatin swish and spit for now and maintain oral hygiene  dementia Get SLP consult, continue supportive care  Protein calorie  malnutrition Monitor po intake, to be followed by dietary team. Continue fortified pudding  Dysphagia Get SLP consult, aspiration precautions  Iron def anemia Monitor cbc  Hypertension  Monitor bp readings, continue metoprolol for now  GERD  Stable, continue omeprazole    Goals of care: short term rehabilitation   Labs/tests ordered: cbc, bmp  Family/ staff Communication: reviewed care plan with patient and nursing supervisor    Oneal GroutMAHIMA Ave Scharnhorst, MD Internal Medicine [facility] [address] [place], [place]  [address] Cell Phone (Monday-Friday 8 am - 5 pm): [phone] On Call: [phone] and follow prompts after 5 pm and on weekends Office Phone: [phone] Office Fax: [phone]

## 2015-09-15 ENCOUNTER — Encounter: Payer: Self-pay | Admitting: Adult Health

## 2015-09-15 ENCOUNTER — Non-Acute Institutional Stay (SKILLED_NURSING_FACILITY): Payer: Medicare Other | Admitting: Adult Health

## 2015-09-15 DIAGNOSIS — F419 Anxiety disorder, unspecified: Secondary | ICD-10-CM | POA: Diagnosis not present

## 2015-09-15 DIAGNOSIS — M545 Low back pain: Secondary | ICD-10-CM

## 2015-09-16 ENCOUNTER — Encounter: Payer: Self-pay | Admitting: Adult Health

## 2015-09-16 NOTE — Progress Notes (Signed)
Patient ID: [name], female   DOB: Jul 08, 1922, 82 y.o.   MRN: [id number]    DATE:    09/15/15  MRN:  [id number]  BIRTHDAY: 09/24/1922  Facility:  Nursing Home Location:  [facility]  Nursing Home Room Number: 207-P  LEVEL OF CARE:  SNF [phone])  Contact Information    Name Relation Home Work Mobile   Oral Daughter [phone]   Salley Scarlet Other [phone]  [phone]   Karrie Doffing   [phone]       Code Status History    Date Active Date Inactive Code Status Order ID Comments User Context   09/10/2015 12:39 PM 09/11/2015  4:54 PM DNR [id number]  Zannie Cove, MD Inpatient   09/06/2015  5:13 PM 09/10/2015 12:39 PM Full Code [id number]  Jeralyn Bennett, MD Inpatient   10/11/2014 11:32 PM 10/14/2014  2:34 PM Full Code [id number]  Ozella Rocks, MD Inpatient   08/18/2012  6:48 PM 08/25/2012  4:14 PM Full Code [id number]  Laveda Norman, MD Inpatient    Questions for Most Recent Historical Code Status (Order [id number])    Question Answer Comment   In the event of cardiac or respiratory ARREST Do not call a "code blue"    In the event of cardiac or respiratory ARREST Do not perform Intubation, CPR, defibrillation or ACLS    In the event of cardiac or respiratory ARREST Use medication by any route, position, wound care, and other measures to relive pain and suffering. May use oxygen, suction and manual treatment of airway obstruction as needed for comfort.     Advance Directive Documentation        Most Recent Value   Type of Advance Directive  Out of facility DNR (pink MOST or yellow form)   Pre-existing out of facility DNR order (yellow form or pink MOST form)     "MOST" Form in Place?         Chief Complaint  Patient presents with  . Acute Visit    Anxiety and back pain    HISTORY OF PRESENT ILLNESS:   This is a 82 year old female who has been constantly yelling for help (anxious). He has been complaining of low  back pain.   She has been admitted to [facility] on 09/11/15 from [facility]. She has PMH of cognitive impairment. She was living home alone with family members nearby. She was found by family members on the floor in the living room down. She was able to ambulate but was having increased confusion and generalized weakness. She was treated or UTI with IV ceftriaxone then changed to po amoxicillin. She had atrial fibrillation with RVR and was treated with Cardizem drip initially. Her Cardizem and metoprolol dose were increased due to poorly controlled atrial fibrillation.  She has been admitted for a short-term rehabilitation.  PAST MEDICAL HISTORY:  Past Medical History  Diagnosis Date  . Arthritis   . GERD (gastroesophageal reflux disease)   . Hypertension   . Depression   . Hypercholesteremia   . Osteoporosis   . Anxiety   . Dyskinesia   . Dementia   . Esophageal stricture   . Dysrhythmia     hx. Paroxsymal A. Fib     CURRENT MEDICATIONS: Reviewed  Patient's Medications  New Prescriptions   No medications on file  Previous Medications   ALPRAZOLAM (XANAX) 0.25 MG TABLET    Take 0.125 mg by mouth 2 (two) times daily as needed  for anxiety. Take 1/2 of a 0.25 mg tablet to = 0.125 mg po BID PRN   DILTIAZEM (CARDIZEM CD) 240 MG 24 HR CAPSULE    Take 1 capsule (240 mg total) by mouth daily.   MELATONIN 3 MG TABS    Take 1 tablet by mouth at bedtime.   MENTHOL, TOPICAL ANALGESIC, (BIOFREEZE) 4 % GEL    Apply 1 application topically. Apply to back each shift   METOPROLOL TARTRATE (LOPRESSOR) 50 MG TABLET    Take 1 tablet (50 mg total) by mouth 2 (two) times daily.   NYSTATIN (MYCOSTATIN) 100000 UNIT/ML SUSPENSION    Take 5 mLs by mouth 4 (four) times daily.   OMEPRAZOLE (PRILOSEC) 20 MG CAPSULE    Take 20 mg by mouth 2 (two) times daily before a meal.    PROTEIN (PROCEL) POWD    Take 2 scoop by mouth 2 (two) times daily.   RIVAROXABAN (XARELTO) 15 MG TABS TABLET    Take 1  tablet (15 mg total) by mouth daily with supper.  Modified Medications   No medications on file  Discontinued Medications   AMOXICILLIN (AMOXIL) 500 MG CAPSULE    Take 1 capsule (500 mg total) by mouth every 8 (eight) hours. For 3 days     Allergies  Allergen Reactions  . Codeine Nausea And Vomiting     REVIEW OF SYSTEMS:  GENERAL: no change in appetite, no fatigue, no weight changes, no fever, chills or weakness EYES: Denies change in vision, dry eyes, eye pain, itching or discharge EARS: Denies change in hearing, ringing in ears, or earache NOSE: Denies nasal congestion or epistaxis MOUTH and THROAT: Denies oral discomfort, gingival pain or bleeding, pain from teeth or hoarseness   RESPIRATORY: no cough, SOB, DOE, wheezing, hemoptysis CARDIAC: no chest pain, edema or palpitations GI: no abdominal pain, diarrhea, constipation, heart burn, nausea or vomiting GU: Denies dysuria, frequency, hematuria, incontinence, or discharge PSYCHIATRIC: Denies feeling of depression. No report of hallucinations, insomnia, paranoia, or agitation, +anxiety    PHYSICAL EXAMINATION  GENERAL APPEARANCE: Well nourished. In no acute distress. Normal body habitus SKIN:  Skin is warm and dry.  HEAD: Normal in size and contour. No evidence of trauma EYES: Lids open and close normally. No blepharitis, entropion or ectropion. PERRL. Conjunctivae are clear and sclerae are white. Lenses are without opacity EARS: Pinnae are normal. Patient hears normal voice tunes of the examiner MOUTH and THROAT: Lips are without lesions. Oral mucosa is moist and without lesions. Tongue has brownish cheesy coating NECK: supple, trachea midline, no neck masses, no thyroid tenderness, no thyromegaly LYMPHATICS: no LAN in the neck, no supraclavicular LAN RESPIRATORY: breathing is even & unlabored, BS CTAB CARDIAC: RRR, no murmur,no extra heart sounds, no edema GI: abdomen soft, normal BS, no masses, no tenderness, no  hepatomegaly, no splenomegaly EXTREMITIES:  Able to move 4 extremities PSYCHIATRIC: Alert to person and place but disoriented to time. Anxious, constantly yelling for help.  LABS/RADIOLOGY: Labs reviewed: Basic Metabolic Panel:  Recent Labs  30/86/5705/19/17 0445 09/10/15 0834 09/11/15 0505 09/13/15  NA 135 135 135 136*  K 2.7* 4.1 4.0 3.8  CL 105 105 103  --   CO2 23 23 26   --   GLUCOSE 115* 137* 110*  --   BUN 8 8 7 13   CREATININE 0.87 0.93 0.87 1.0  CALCIUM 8.7* 9.1 8.8*  --    CBC:  Recent Labs  10/11/14 1908  09/09/15 0445 09/10/15 0834 09/11/15 0505 09/13/15  WBC 6.5  < > 7.2 7.6 8.9 8.1  NEUTROABS 4.6  --   --   --   --  6  HGB 13.2  < > 12.2 13.6 13.4 13.7  HCT 39.0  < > 34.8* 38.8 38.9 40  MCV 97.0  < > 92.8 93.3 95.6  --   PLT 254  < > 249 313 304 366  < > = values in this interval not displayed.  Cardiac Enzymes:  Recent Labs  09/06/15 1211  CKTOTAL 117   CBG:  Recent Labs  09/06/15 1203  GLUCAP 157*    Dg Chest 2 View  09/06/2015  CLINICAL DATA:  Fall. EXAM: CHEST  2 VIEW COMPARISON:  October 11, 2014. FINDINGS: Stable cardiomediastinal silhouette. No pneumothorax or pleural effusion is noted. No acute pulmonary disease is noted. Bony thorax is unremarkable. IMPRESSION: No active cardiopulmonary disease. Electronically Signed   By: Lupita Raider, M.D.   On: 09/06/2015 13:06   Ct Head Wo Contrast  09/06/2015  CLINICAL DATA:  Recent fall EXAM: CT HEAD WITHOUT CONTRAST TECHNIQUE: Contiguous axial images were obtained from the base of the skull through the vertex without intravenous contrast. COMPARISON:  01/28/2007 FINDINGS: The bony calvarium is intact. Diffuse atrophic changes are noted. No findings to suggest acute hemorrhage, acute infarction or space-occupying mass lesion is noted. IMPRESSION: Chronic atrophic changes without acute abnormality. Electronically Signed   By: Alcide Clever M.D.   On: 09/06/2015 12:59    ASSESSMENT/PLAN:  Low back pain -  start applying Biofreeze 4% gel to low back Q shift.  Anxiety - start Xanax 0.25 mg 1/2 tab = 0.125 mg PO BID PRN     Kenard Gower, NP [facility] [phone]

## 2015-09-28 ENCOUNTER — Non-Acute Institutional Stay (SKILLED_NURSING_FACILITY): Payer: Medicare Other | Admitting: Adult Health

## 2015-09-28 ENCOUNTER — Encounter: Payer: Self-pay | Admitting: Adult Health

## 2015-09-28 DIAGNOSIS — R600 Localized edema: Secondary | ICD-10-CM

## 2015-09-28 NOTE — Progress Notes (Signed)
Patient ID: Edison NasutiSusie K [name], female   DOB: 1922/11/23, 82 y.o.   MRN: [id number]    DATE:    56/07/17  MRN:  [id number]  BIRTHDAY: 1922/11/23  Facility:  Nursing Home Location:  [facility]  Nursing Home Room Number: 101-P  LEVEL OF CARE:  SNF [phone]  Contact Information    Name Relation Home Work Mobile   WrightsvilleSykes,Beverly Daughter [phone]   [name],Al Other [phone]   Karrie DoffingSkyes,Hunter Grandson   [phone]       Code Status History    Date Active Date Inactive Code Status Order ID Comments User Context   09/10/2015 12:39 PM 09/11/2015  4:54 PM DNR [id number]  Zannie CovePreetha Joseph, MD Inpatient   09/06/2015  5:13 PM 09/10/2015 12:39 PM Full Code [id number]  Jeralyn BennettEzequiel Zamora, MD Inpatient   10/11/2014 11:32 PM 10/14/2014  2:34 PM Full Code [id number]  Ozella Rocksavid J Merrell, MD Inpatient   08/18/2012  6:48 PM 08/25/2012  4:14 PM Full Code [id number]  Laveda Normanhris N Oti, MD Inpatient    Questions for Most Recent Historical Code Status (Order [id number])    Question Answer Comment   In the event of cardiac or respiratory ARREST Do not call a "code blue"    In the event of cardiac or respiratory ARREST Do not perform Intubation, CPR, defibrillation or ACLS    In the event of cardiac or respiratory ARREST Use medication by any route, position, wound care, and other measures to relive pain and suffering. May use oxygen, suction and manual treatment of airway obstruction as needed for comfort.     Advance Directive Documentation        Most Recent Value   Type of Advance Directive  Out of facility DNR (pink MOST or yellow form)   Pre-existing out of facility DNR order (yellow form or pink MOST form)     "MOST" Form in Place?         Chief Complaint  Patient presents with  . Acute Visit    BLE edema    HISTORY OF PRESENT ILLNESS:   This is a 82 year old female who was noted to have BLE edema 2+. Bilateral venous ultrasound was done and result was  negative for DVT.   [facility] She has PMH of cognitive impairment. She was living home alone with family members nearby. She was found by family members on the floor in the living room down. She was able to ambulate but was having increased confusion and generalized weakness. She was treated or UTI with IV ceftriaxone then changed to po amoxicillin. She had atrial fibrillation with RVR and was treated with Cardizem drip initially. Her Cardizem and metoprolol dose were increased due to poorly controlled atrial fibrillation.  She is currently having short-term rehabilitation.  PAST MEDICAL HISTORY:  Past Medical History  Diagnosis Date  . Arthritis   . GERD (gastroesophageal reflux disease)   . Hypertension   . Depression   . Hypercholesteremia   . Osteoporosis   . Anxiety   . Dyskinesia   . Dementia   . Esophageal stricture   . Dysrhythmia     hx. Paroxsymal A. Fib     CURRENT MEDICATIONS: Reviewed  Patient's Medications  New Prescriptions   No medications on file  Previous Medications   ALPRAZOLAM (XANAX) 0.25 MG TABLET    Take 0.25 mg by mouth 2 (two) times daily as needed for  anxiety.    BUSPIRONE (BUSPAR) 5 MG TABLET    Take 5 mg by mouth 2 (two) times daily. 12PM and 8PM   DILTIAZEM (DILTIAZEM CD) 240 MG 24 HR CAPSULE    Take 240 mg by mouth daily.   MELATONIN 5 MG TABS    Take 1 tablet by mouth at bedtime.   MENTHOL, TOPICAL ANALGESIC, (BIOFREEZE) 4 % GEL    Apply 1 application topically. Apply to back each shift   METOPROLOL TARTRATE (LOPRESSOR) 50 MG TABLET    Take 1 tablet (50 mg total) by mouth 2 (two) times daily.   MOXIFLOXACIN (AVELOX) 400 MG TABLET    Take 400 mg by mouth daily at 8 pm.   OMEPRAZOLE (PRILOSEC) 20 MG CAPSULE    Take 20 mg by mouth 2 (two) times daily before a meal.    PROTEIN (PROCEL) POWD    Take 2 scoop by mouth 2 (two) times daily.   RIVAROXABAN (XARELTO) 15 MG TABS TABLET    Take 1  tablet (15 mg total) by mouth daily with supper.   SACCHAROMYCES BOULARDII (FLORASTOR) 250 MG CAPSULE    Take 250 mg by mouth 2 (two) times daily.  Modified Medications   No medications on file  Discontinued Medications   DILTIAZEM (CARDIZEM CD) 240 MG 24 HR CAPSULE    Take 1 capsule (240 mg total) by mouth daily.   FUROSEMIDE (LASIX) 20 MG TABLET    Take 20 mg by mouth daily. Take for 5 days for BLE edema   MELATONIN 3 MG TABS    Take 1 tablet by mouth at bedtime.   NYSTATIN (MYCOSTATIN) 100000 UNIT/ML SUSPENSION    Take 5 mLs by mouth 4 (four) times daily.     Allergies  Allergen Reactions  . Codeine Nausea And Vomiting     REVIEW OF SYSTEMS:  GENERAL: no change in appetite, no fatigue, no weight changes, no fever, chills or weakness EYES: Denies change in vision, dry eyes, eye pain, itching or discharge EARS: Denies change in hearing, ringing in ears, or earache NOSE: Denies nasal congestion or epistaxis MOUTH and THROAT: Denies oral discomfort, gingival pain or bleeding, pain from teeth or hoarseness   RESPIRATORY: no cough, SOB, DOE, wheezing, hemoptysis CARDIAC: no chest pain, or palpitations GI: no abdominal pain, diarrhea, constipation, heart burn, nausea or vomiting GU: Denies dysuria, frequency, hematuria, incontinence, or discharge PSYCHIATRIC: Denies feeling of depression. No report of hallucinations, insomnia, paranoia, or agitation, +anxiety    PHYSICAL EXAMINATION  GENERAL APPEARANCE: Well nourished. In no acute distress. Normal body habitus SKIN:  Skin is warm and dry.  HEAD: Normal in size and contour. No evidence of trauma EYES: Lids open and close normally. No blepharitis, entropion or ectropion. PERRL. Conjunctivae are clear and sclerae are white. Lenses are without opacity EARS: Pinnae are normal. Patient hears normal voice tunes of the examiner MOUTH and THROAT: Lips are without lesions. Oral mucosa is moist and without lesions. Tongue has brownish cheesy  coating NECK: supple, trachea midline, no neck masses, no thyroid tenderness, no thyromegaly LYMPHATICS: no LAN in the neck, no supraclavicular LAN RESPIRATORY: breathing is even & unlabored, BS CTAB CARDIAC: RRR, no murmur,no extra heart sounds, BLE edema 2+ GI: abdomen soft, normal BS, no masses, no tenderness, no hepatomegaly, no splenomegaly EXTREMITIES:  Able to move 4 extremities PSYCHIATRIC: Alert to person and place but disoriented to time.   LABS/RADIOLOGY: Labs reviewed: Basic Metabolic Panel:  Recent Labs  16/10/96 0445 09/10/15 0454  09/11/15 0505 09/13/15  NA 135 135 135 136*  K 2.7* 4.1 4.0 3.8  CL 105 105 103  --   CO2 23 23 26   --   GLUCOSE 115* 137* 110*  --   BUN 8 8 7 13   CREATININE 0.87 0.93 0.87 1.0  CALCIUM 8.7* 9.1 8.8*  --    CBC:  Recent Labs  09/09/15 0445 09/10/15 0834 09/11/15 0505 09/13/15  WBC 7.2 7.6 8.9 8.1  NEUTROABS  --   --   --  6  HGB 12.2 13.6 13.4 13.7  HCT 34.8* 38.8 38.9 40  MCV 92.8 93.3 95.6  --   PLT 249 313 304 366    Cardiac Enzymes:  Recent Labs  09/06/15 1211  CKTOTAL 117   CBG:  Recent Labs  09/06/15 1203  GLUCAP 157*    No results found.  ASSESSMENT/PLAN:  Bilateral Lower Extremity Edema - start Lasix 20 mg 1 tab daily X 5 days; BMP in 1 week     Kenard Gower, NP [facility] [phone]

## 2015-10-04 ENCOUNTER — Non-Acute Institutional Stay (SKILLED_NURSING_FACILITY): Payer: Medicare Other | Admitting: Adult Health

## 2015-10-04 ENCOUNTER — Encounter: Payer: Self-pay | Admitting: Adult Health

## 2015-10-04 DIAGNOSIS — F419 Anxiety disorder, unspecified: Secondary | ICD-10-CM | POA: Diagnosis not present

## 2015-10-04 DIAGNOSIS — E43 Unspecified severe protein-calorie malnutrition: Secondary | ICD-10-CM

## 2015-10-04 DIAGNOSIS — I482 Chronic atrial fibrillation, unspecified: Secondary | ICD-10-CM

## 2015-10-04 DIAGNOSIS — I10 Essential (primary) hypertension: Secondary | ICD-10-CM | POA: Diagnosis not present

## 2015-10-04 DIAGNOSIS — F0390 Unspecified dementia without behavioral disturbance: Secondary | ICD-10-CM | POA: Diagnosis not present

## 2015-10-04 DIAGNOSIS — R627 Adult failure to thrive: Secondary | ICD-10-CM | POA: Diagnosis not present

## 2015-10-04 DIAGNOSIS — J189 Pneumonia, unspecified organism: Secondary | ICD-10-CM

## 2015-10-04 DIAGNOSIS — R5381 Other malaise: Secondary | ICD-10-CM

## 2015-10-04 DIAGNOSIS — K2100 Gastro-esophageal reflux disease with esophagitis, without bleeding: Secondary | ICD-10-CM

## 2015-10-04 NOTE — Progress Notes (Signed)
Patient ID: [name], female   DOB: August 10, 1922, 82 y.o.   MRN: [id number]    DATE:    10/04/15  MRN:  [id number]  BIRTHDAY: Mar 19, 1923  Facility:  Nursing Home Location:  [facility]  Nursing Home Room Number: 101-P  LEVEL OF CARE:  SNF [phone])  Contact Information    Name Relation Home Work Mobile   [place] Daughter [phone]   Salley Scarlet Other [phone]  [phone]   Karrie Doffing   [phone]       Code Status History    Date Active Date Inactive Code Status Order ID Comments User Context   09/10/2015 12:39 PM 09/11/2015  4:54 PM DNR [id number]  Zannie Cove, MD Inpatient   09/06/2015  5:13 PM 09/10/2015 12:39 PM Full Code [id number]  Jeralyn Bennett, MD Inpatient   10/11/2014 11:32 PM 10/14/2014  2:34 PM Full Code [id number]  Ozella Rocks, MD Inpatient   08/18/2012  6:48 PM 08/25/2012  4:14 PM Full Code [id number]  Laveda Norman, MD Inpatient    Questions for Most Recent Historical Code Status (Order [id number])    Question Answer Comment   In the event of cardiac or respiratory ARREST Do not call a "code blue"    In the event of cardiac or respiratory ARREST Do not perform Intubation, CPR, defibrillation or ACLS    In the event of cardiac or respiratory ARREST Use medication by any route, position, wound care, and other measures to relive pain and suffering. May use oxygen, suction and manual treatment of airway obstruction as needed for comfort.     Advance Directive Documentation        Most Recent Value   Type of Advance Directive  Out of facility DNR (pink MOST or yellow form)   Pre-existing out of facility DNR order (yellow form or pink MOST form)     "MOST" Form in Place?         Chief Complaint  Patient presents with  . Discharge Note    HISTORY OF PRESENT ILLNESS:   This is a 82 year old female who is for discharge home with Home health PT, OT, CNA and Nursing. DME:  Standard wheelchair, foot rests,  anti-tippers, cushion and brake extension.   CXR showed that patient has bibasilar pneumonia.  She has been admitted to [facility] on 09/11/15 from [facility]. She has PMH of cognitive impairment. She was living home alone with family members nearby. She was found by family members on the floor in the living room down. She was able to ambulate but was having increased confusion and generalized weakness. She was treated or UTI with IV ceftriaxone then changed to po amoxicillin. She had atrial fibrillation with RVR and was treated with Cardizem drip initially. Her Cardizem and metoprolol dose were increased due to poorly controlled atrial fibrillation.  Patient was admitted to this facility for short-term rehabilitation after the patient's recent hospitalization.  Patient has completed SNF rehabilitation and therapy has cleared the patient for discharge.  PAST MEDICAL HISTORY:  Past Medical History  Diagnosis Date  . Arthritis   . GERD (gastroesophageal reflux disease)   . Hypertension   . Depression   . Hypercholesteremia   . Osteoporosis   . Anxiety   . Dyskinesia   . Dementia   . Esophageal stricture   . Dysrhythmia     hx. Paroxsymal A. Fib     CURRENT MEDICATIONS: Reviewed  Patient's Medications  New Prescriptions  No medications on file  Previous Medications   ALPRAZOLAM (XANAX) 0.25 MG TABLET    Take 0.25 mg by mouth 2 (two) times daily as needed for anxiety.    BUSPIRONE (BUSPAR) 5 MG TABLET    Take 5 mg by mouth 2 (two) times daily. 12PM and 8PM   DILTIAZEM (DILTIAZEM CD) 240 MG 24 HR CAPSULE    Take 240 mg by mouth daily.   MELATONIN 5 MG TABS    Take 1 tablet by mouth at bedtime.   MENTHOL, TOPICAL ANALGESIC, (BIOFREEZE) 4 % GEL    Apply 1 application topically. Apply to back each shift   METOPROLOL TARTRATE (LOPRESSOR) 50 MG TABLET    Take 1 tablet (50 mg total) by mouth 2 (two) times daily.   MOXIFLOXACIN (AVELOX) 400 MG TABLET    Take 400 mg by mouth daily  at 8 pm.   OMEPRAZOLE (PRILOSEC) 20 MG CAPSULE    Take 20 mg by mouth 2 (two) times daily before a meal.    PROTEIN (PROCEL) POWD    Take 2 scoop by mouth 2 (two) times daily.   RIVAROXABAN (XARELTO) 15 MG TABS TABLET    Take 1 tablet (15 mg total) by mouth daily with supper.   SACCHAROMYCES BOULARDII (FLORASTOR) 250 MG CAPSULE    Take 250 mg by mouth 2 (two) times daily.  Modified Medications   No medications on file  Discontinued Medications   FUROSEMIDE (LASIX) 20 MG TABLET    Take 20 mg by mouth daily. Take for 5 days for BLE edema   MELATONIN 3 MG TABS    Take 1 tablet by mouth at bedtime.     Allergies  Allergen Reactions  . Codeine Nausea And Vomiting     REVIEW OF SYSTEMS:  GENERAL: no change in appetite, no fatigue, no weight changes, no fever, chills or weakness EYES: Denies change in vision, dry eyes, eye pain, itching or discharge EARS: Denies change in hearing, ringing in ears, or earache NOSE: Denies nasal congestion or epistaxis MOUTH and THROAT: Denies oral discomfort, gingival pain or bleeding, pain from teeth or hoarseness   RESPIRATORY: no cough, SOB, DOE, wheezing, hemoptysis CARDIAC: no chest pain, or palpitations GI: no abdominal pain, diarrhea, constipation, heart burn, nausea or vomiting GU: Denies dysuria, frequency, hematuria, incontinence, or discharge PSYCHIATRIC: Denies feeling of depression or anxiety. No report of hallucinations, insomnia, paranoia, or agitation    PHYSICAL EXAMINATION  GENERAL APPEARANCE: Well nourished. In no acute distress. Normal body habitus SKIN:  Skin is warm and dry.  HEAD: Normal in size and contour. No evidence of trauma EYES: Lids open and close normally. No blepharitis, entropion or ectropion. PERRL. Conjunctivae are clear and sclerae are white. Lenses are without opacity EARS: Pinnae are normal. Patient hears normal voice tunes of the examiner MOUTH and THROAT: Lips are without lesions. Oral mucosa is moist and  without lesions. Tongue has brownish cheesy coating NECK: supple, trachea midline, no neck masses, no thyroid tenderness, no thyromegaly LYMPHATICS: no LAN in the neck, no supraclavicular LAN RESPIRATORY: breathing is even & unlabored, BS CTAB CARDIAC: RRR, no murmur,no extra heart sounds, BLE edema 2+ GI: abdomen soft, normal BS, no masses, no tenderness, no hepatomegaly, no splenomegaly EXTREMITIES:  Able to move 4 extremities PSYCHIATRIC: Alert to person and place but disoriented to time. Affect and behavior are appropriate  LABS/RADIOLOGY: Labs reviewed: Basic Metabolic Panel:  Recent Labs  16/10/96 0445 09/10/15 0834 09/11/15 0505 09/13/15  NA 135 135  135 136*  K 2.7* 4.1 4.0 3.8  CL 105 105 103  --   CO2 23 23 26   --   GLUCOSE 115* 137* 110*  --   BUN 8 8 7 13   CREATININE 0.87 0.93 0.87 1.0  CALCIUM 8.7* 9.1 8.8*  --    CBC:  Recent Labs  09/09/15 0445 09/10/15 0834 09/11/15 0505 09/13/15  WBC 7.2 7.6 8.9 8.1  NEUTROABS  --   --   --  6  HGB 12.2 13.6 13.4 13.7  HCT 34.8* 38.8 38.9 40  MCV 92.8 93.3 95.6  --   PLT 249 313 304 366    Cardiac Enzymes:  Recent Labs  09/06/15 1211  CKTOTAL 117   CBG:  Recent Labs  09/06/15 1203  GLUCAP 157*    No results found.  ASSESSMENT/PLAN:  Physical deconditioning - for Home health PT, OT, CNA and Nursing  Failure to thrive -  continue supplementation and supportive care  Atrial fibrillation with RVR - rate controlled; continue Xarelto, Cardizem and metoprolol  Hypertension - continue Cardizem and metoprolol  Dementia - stable  GERD - continue omeprazole  Protein calorie malnutrition, severe -  vitamin D 2.8; start Procel 2 scoops by mouth twice a day  Pneumonia - start Avelox 400 mg 1 tab by mouth daily 10 days and Florastor 250 mg 1 capsule by mouth twice a day 13 days  Anxiety - continue BuSpar 7.5 mg 1 tab by mouth twice a day; Xanax was discontinued      I have filled out patient's  discharge paperwork and written prescriptions.  Patient will receive home health PT, OT, Nursing and CNA.  DME provided:  Standard wheelchair, foot rests, anti-tippers, cushion and brake extension  Total discharge time: Greater than 30 minutes  Discharge time involved coordination of the discharge process with social worker, nursing staff and therapy department. Medical justification for home health services/DME verified.    Kenard GowerMonina Medina-Vargas, NP [facility] [phone]

## 2015-11-22 DEATH — deceased

## 2017-01-14 IMAGING — CR DG CHEST 2V
2 series · 2 of 2 positions shown · non-contrast
Comparison: October 11, 2014.

CLINICAL DATA: Fall.

EXAM:
CHEST  2 VIEW

[w chest lat]
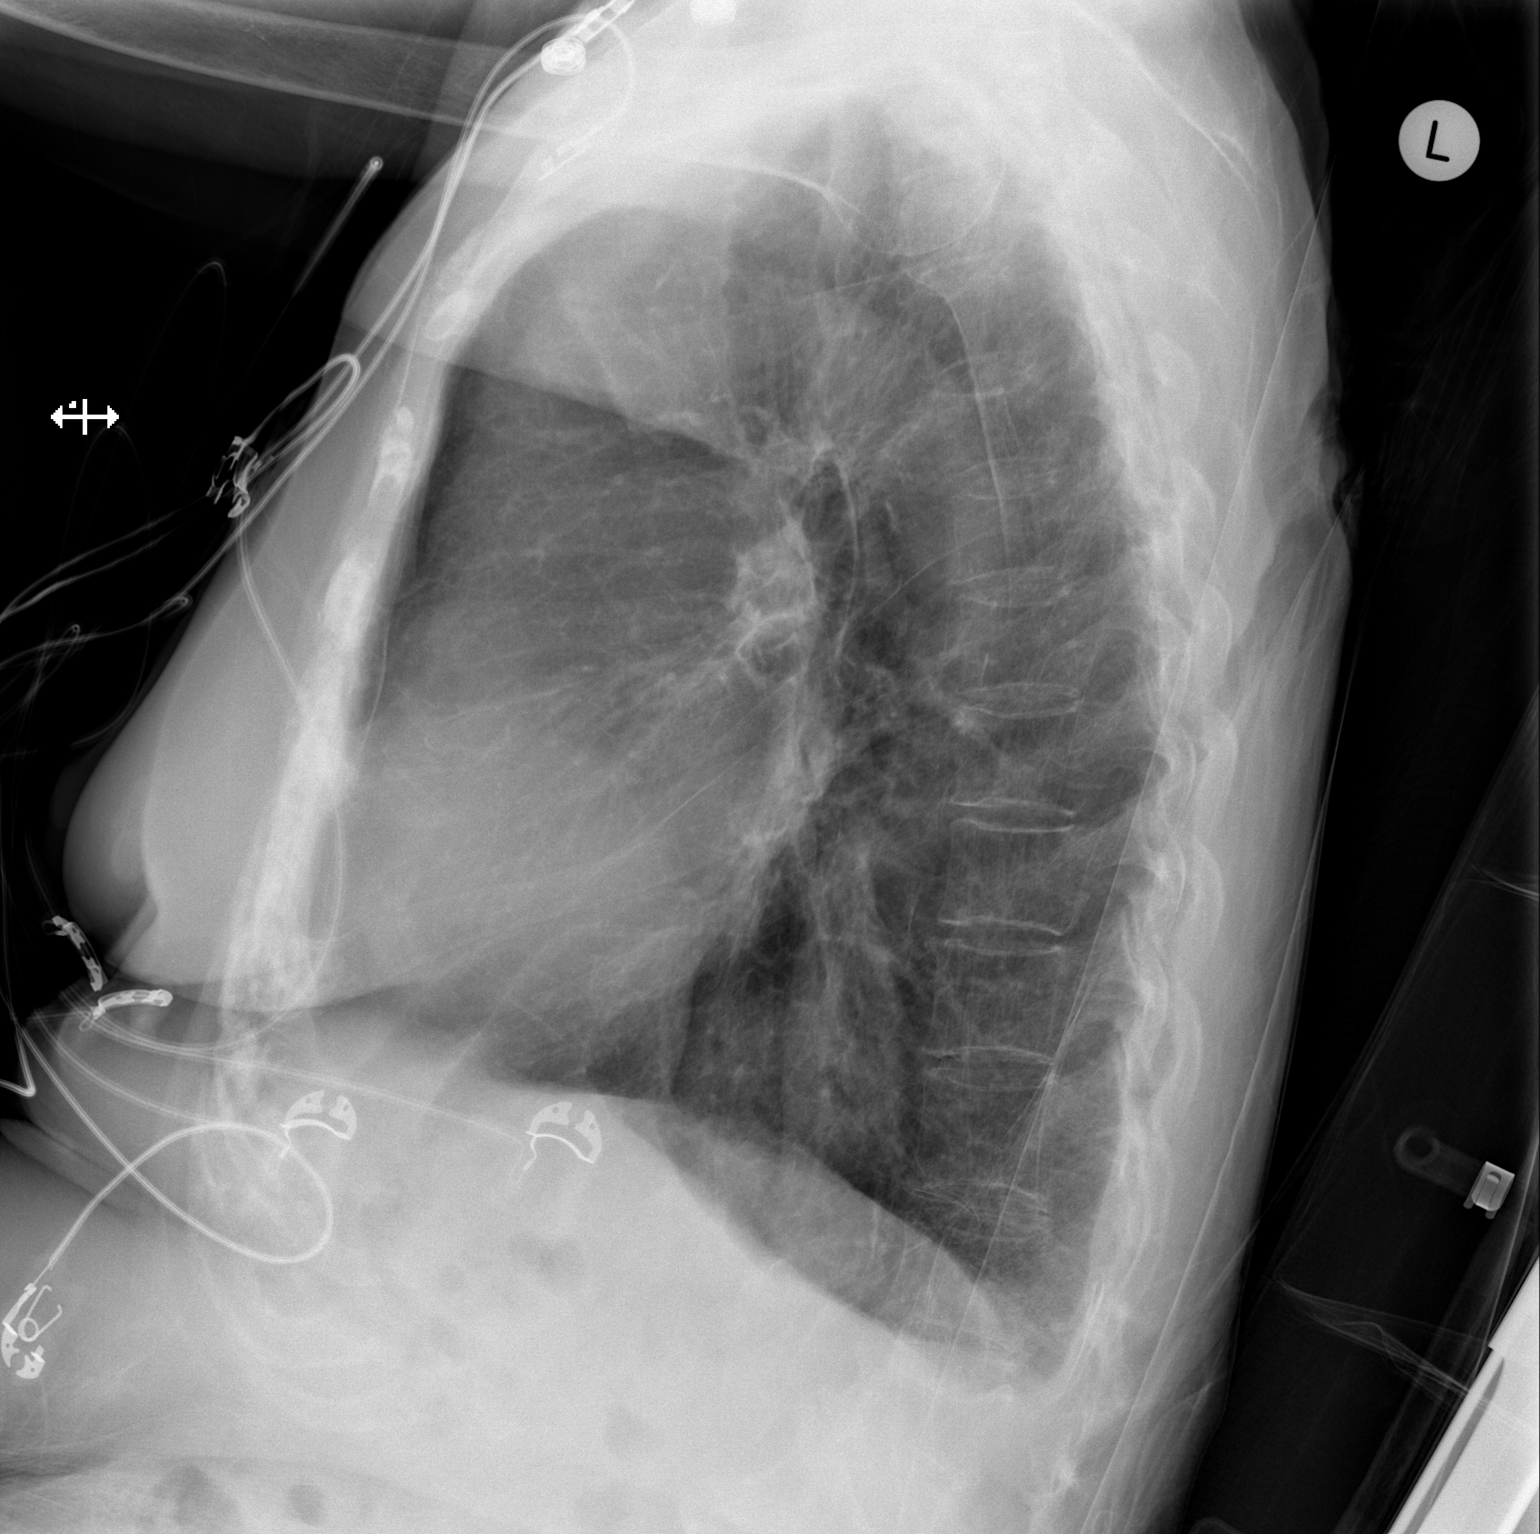

[x chest ap]
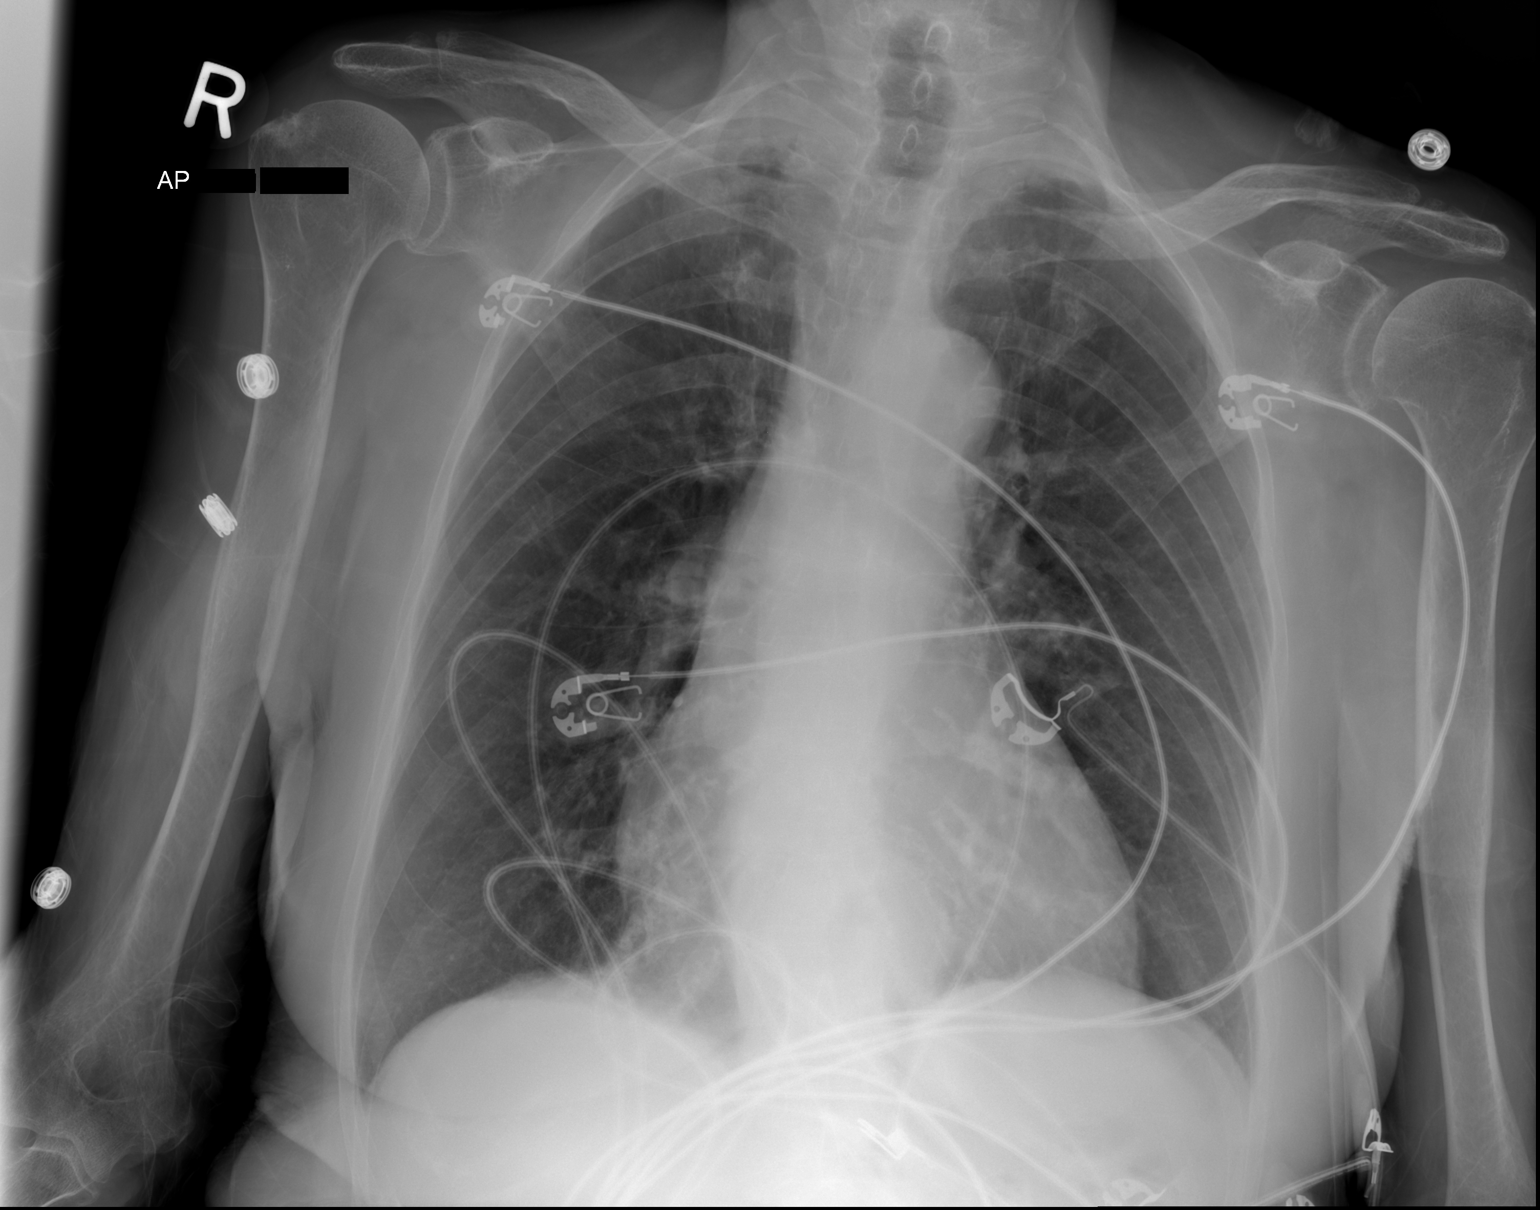

[2 of 2 positions shown; findings below may reference images not displayed]

FINDINGS: Stable cardiomediastinal silhouette. No pneumothorax or pleural
effusion is noted. No acute pulmonary disease is noted. Bony thorax
is unremarkable.
IMPRESSION: No active cardiopulmonary disease.

## 2017-01-14 IMAGING — CT CT HEAD W/O CM
2 series · 17 of 30 positions shown, 20 images · non-contrast
Comparison: 01/28/2007

CLINICAL DATA: Recent fall

EXAM:
CT HEAD WITHOUT CONTRAST
TECHNIQUE: Contiguous axial images were obtained from the base of the skull
through the vertex without intravenous contrast.

[Series 2: head w/o · axial · non-contrast · 0.45mm/px · z∈[-20,+90]mm · 9 of 28 slices shown, 12 images]
[im 3/28  brain]
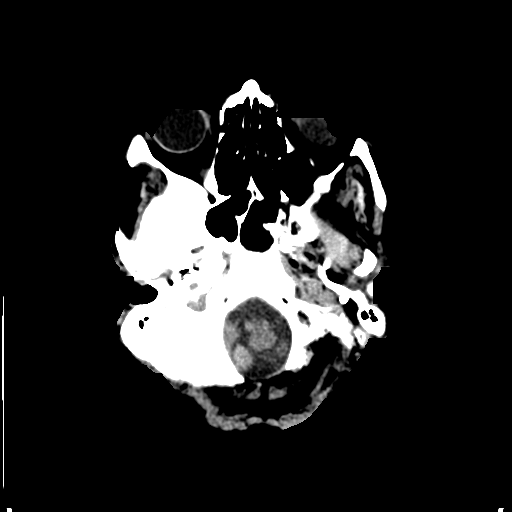
[im 3/28  bone]
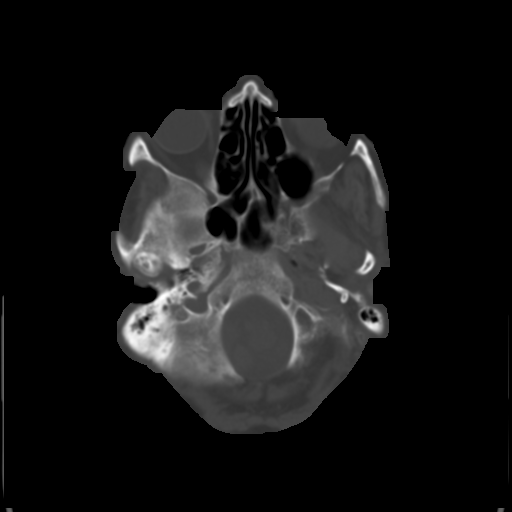
[im 6/28  brain]
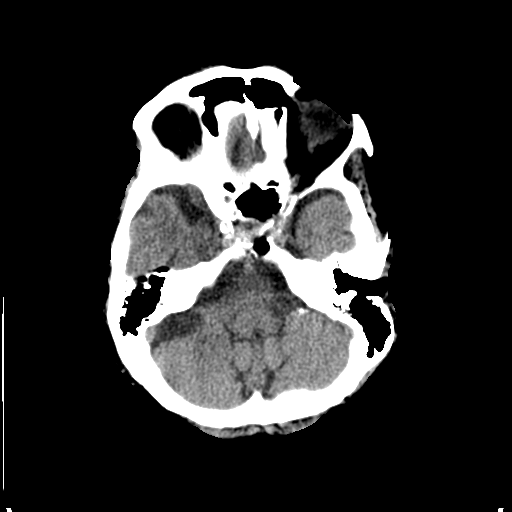
[im 9/28  brain]
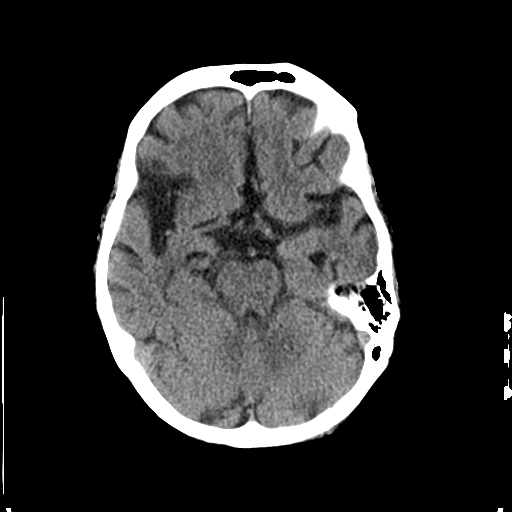
[im 11/28  brain]
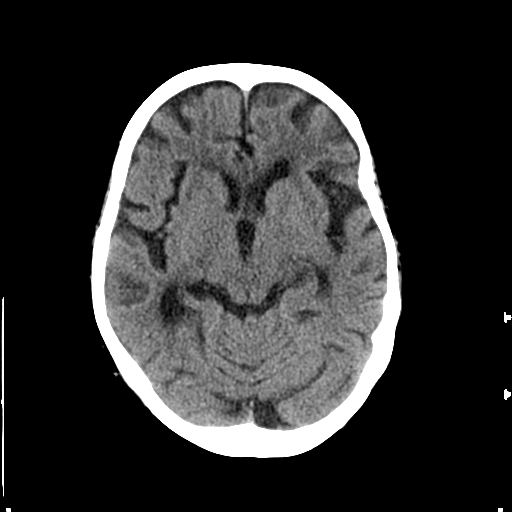
[im 14/28  brain]
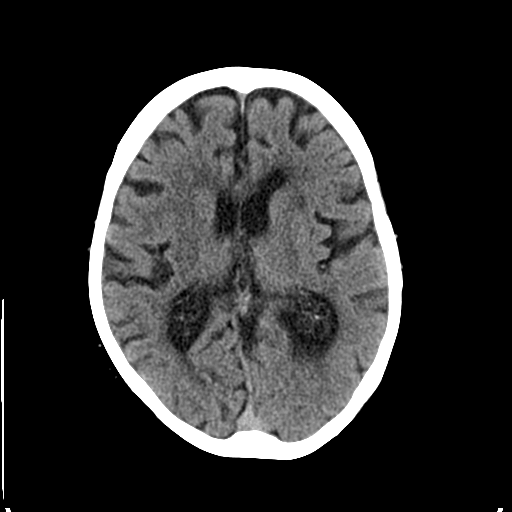
[im 14/28  bone]
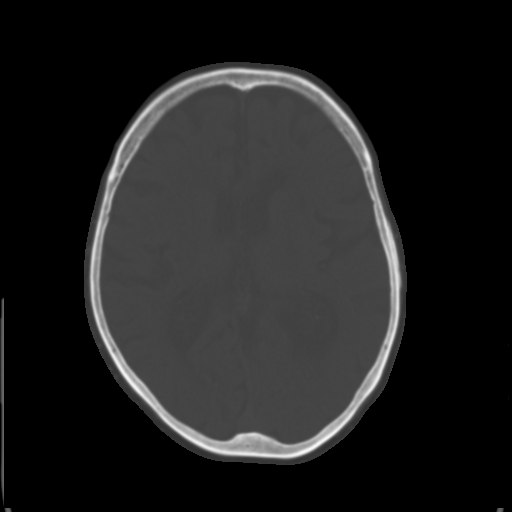
[im 17/28  brain]
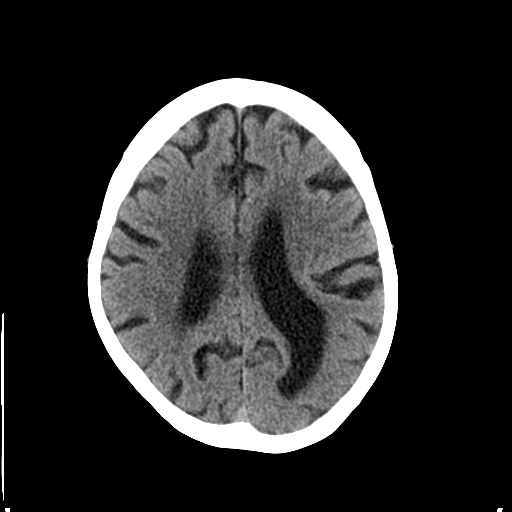
[im 19/28  brain]
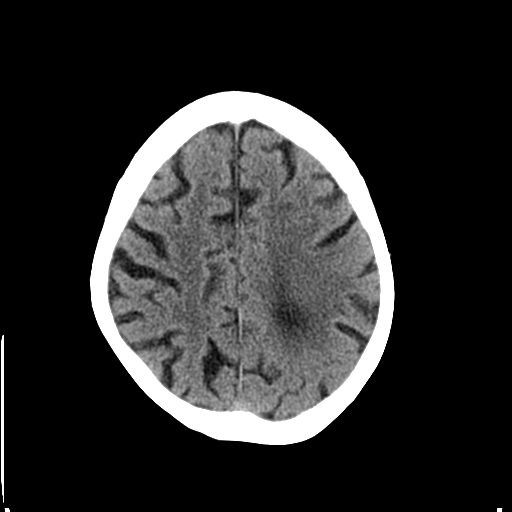
[im 22/28  brain]
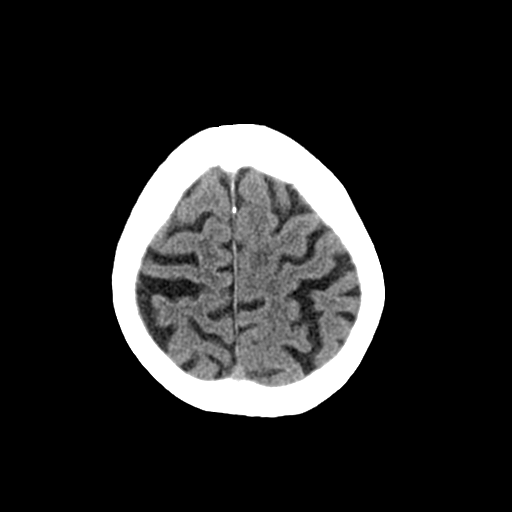
[im 25/28  brain]
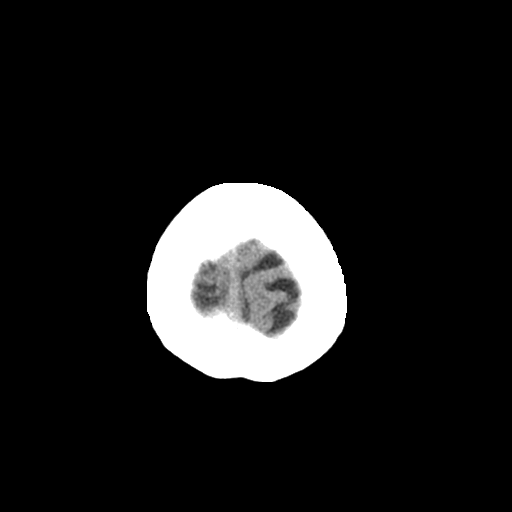
[im 25/28  bone]
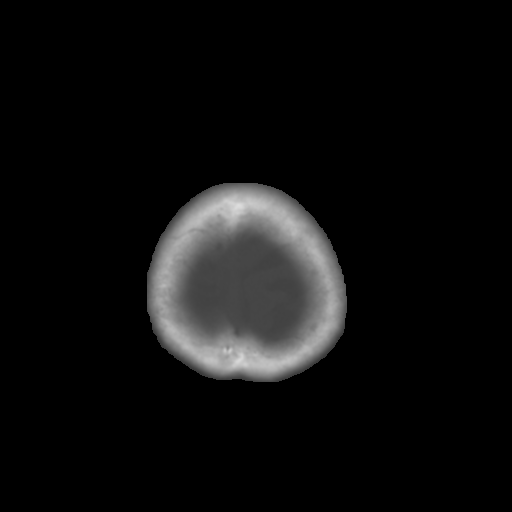

[Series 3: bone windows · axial · 0.45mm/px · z∈[-16,+90]mm · 8 of 46 slices shown]
[im 6/46  bone]
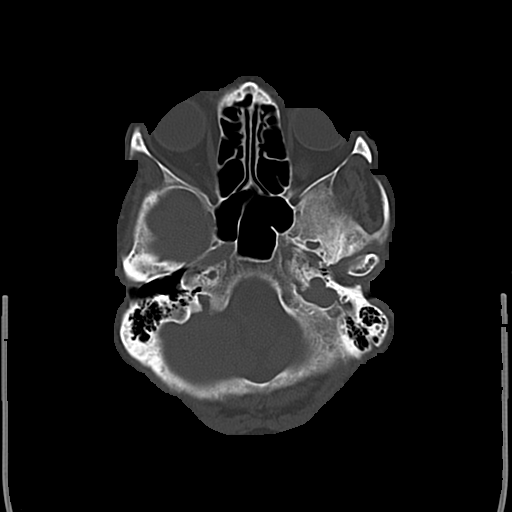
[im 11/46  bone]
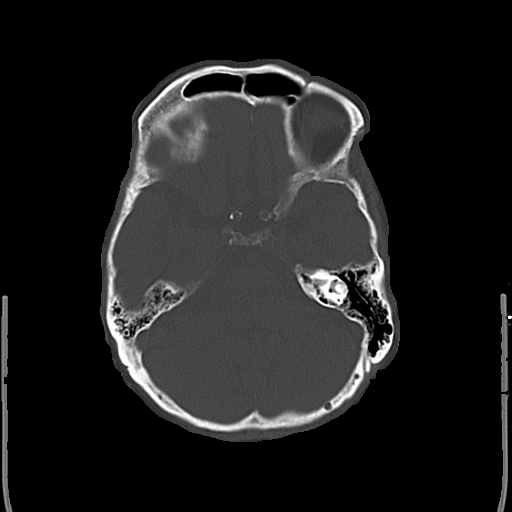
[im 16/46  bone]
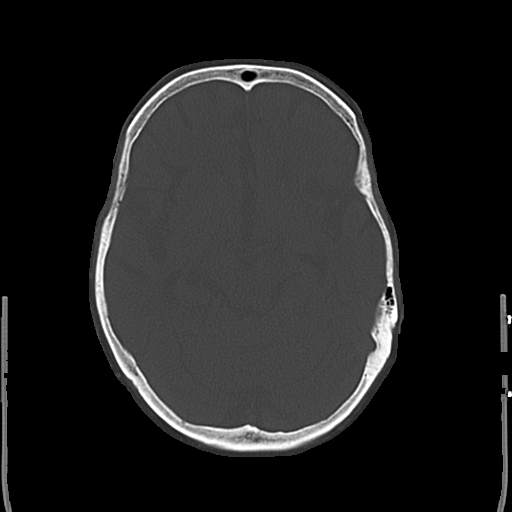
[im 21/46  bone]
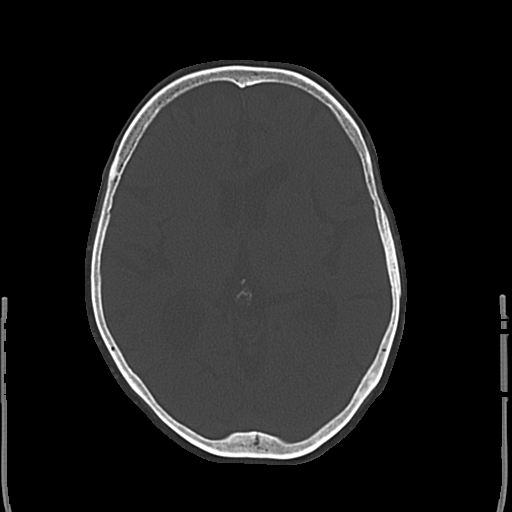
[im 26/46  bone]
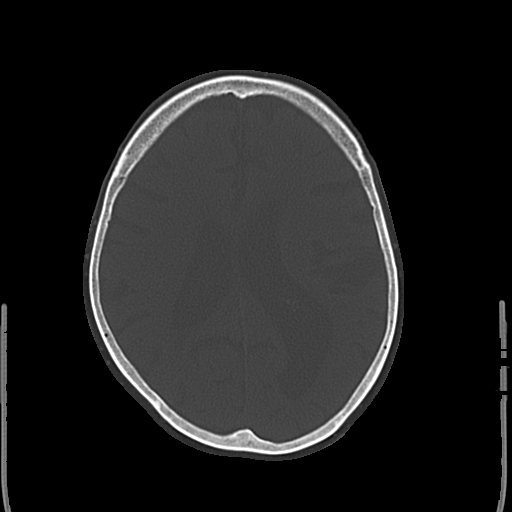
[im 31/46  bone]
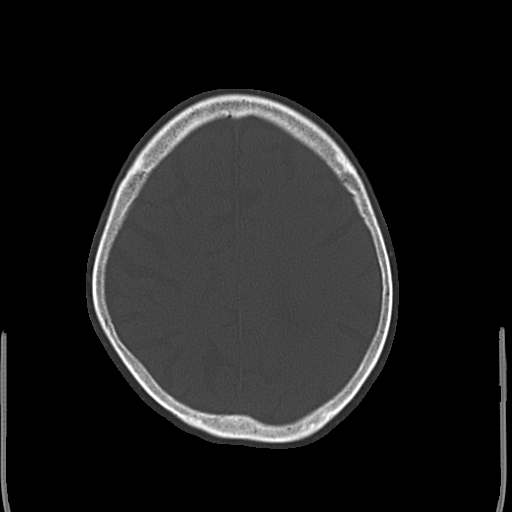
[im 36/46  bone]
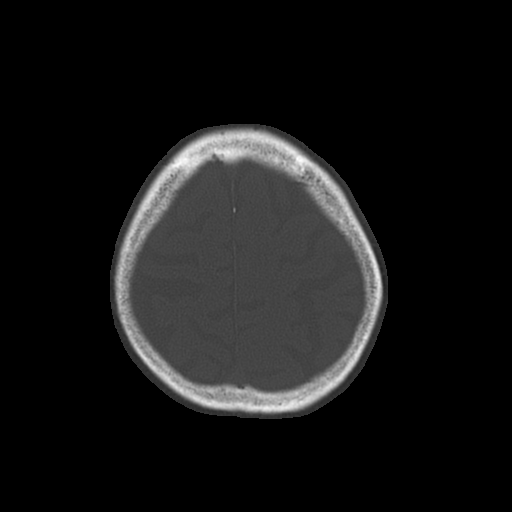
[im 41/46  bone]
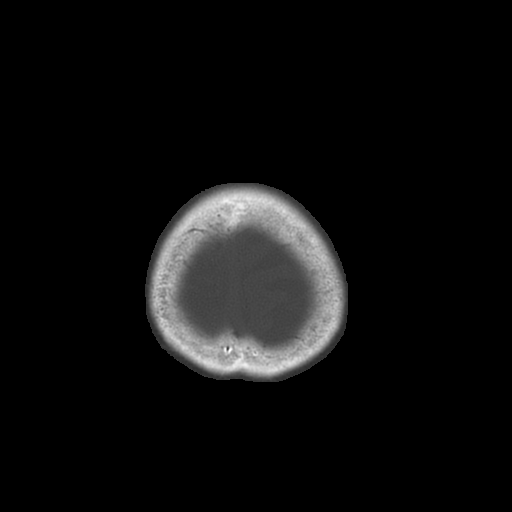

[17 of 30 positions shown; findings below may reference images not displayed]

FINDINGS: The bony calvarium is intact. Diffuse atrophic changes are noted. No
findings to suggest acute hemorrhage, acute infarction or
space-occupying mass lesion is noted.
IMPRESSION: Chronic atrophic changes without acute abnormality.
